# Patient Record
Sex: Male | Born: 1960 | Race: Black or African American | Hispanic: No | Marital: Single | Smoking: Never smoker
Health system: Southern US, Community
[De-identification: ages and names within clinical notes are randomized; demographics above are authoritative.]

## PROBLEM LIST (undated history)

## (undated) DIAGNOSIS — I10 Essential (primary) hypertension: Secondary | ICD-10-CM

## (undated) DIAGNOSIS — G4733 Obstructive sleep apnea (adult) (pediatric): Secondary | ICD-10-CM

## (undated) DIAGNOSIS — E119 Type 2 diabetes mellitus without complications: Secondary | ICD-10-CM

## (undated) DIAGNOSIS — I480 Paroxysmal atrial fibrillation: Secondary | ICD-10-CM

## (undated) DIAGNOSIS — M1990 Unspecified osteoarthritis, unspecified site: Secondary | ICD-10-CM

## (undated) DIAGNOSIS — H269 Unspecified cataract: Secondary | ICD-10-CM

## (undated) DIAGNOSIS — E669 Obesity, unspecified: Secondary | ICD-10-CM

## (undated) DIAGNOSIS — D649 Anemia, unspecified: Secondary | ICD-10-CM

## (undated) DIAGNOSIS — R001 Bradycardia, unspecified: Secondary | ICD-10-CM

## (undated) DIAGNOSIS — T7840XA Allergy, unspecified, initial encounter: Secondary | ICD-10-CM

## (undated) HISTORY — DX: Obesity, unspecified: E66.9

## (undated) HISTORY — DX: Essential (primary) hypertension: I10

## (undated) HISTORY — DX: Allergy, unspecified, initial encounter: T78.40XA

## (undated) HISTORY — DX: Paroxysmal atrial fibrillation: I48.0

## (undated) HISTORY — PX: BILATERAL KNEE ARTHROSCOPY: SUR91

## (undated) HISTORY — DX: Obstructive sleep apnea (adult) (pediatric): G47.33

## (undated) HISTORY — DX: Type 2 diabetes mellitus without complications: E11.9

## (undated) HISTORY — DX: Unspecified cataract: H26.9

## (undated) HISTORY — PX: EYE SURGERY: SHX253

## (undated) HISTORY — DX: Anemia, unspecified: D64.9

## (undated) HISTORY — DX: Bradycardia, unspecified: R00.1

## (undated) HISTORY — PX: JOINT REPLACEMENT: SHX530

## (undated) HISTORY — PX: OTHER SURGICAL HISTORY: SHX169

## (undated) HISTORY — PX: APPENDECTOMY: SHX54

---

## 2007-04-27 ENCOUNTER — Emergency Department (HOSPITAL_COMMUNITY): Admission: EM | Admit: 2007-04-27 | Discharge: 2007-04-27 | Payer: Self-pay | Admitting: Emergency Medicine

## 2009-02-26 ENCOUNTER — Inpatient Hospital Stay (HOSPITAL_COMMUNITY): Admission: EM | Admit: 2009-02-26 | Discharge: 2009-03-04 | Payer: Self-pay | Admitting: Emergency Medicine

## 2009-02-26 ENCOUNTER — Ambulatory Visit: Payer: Self-pay | Admitting: Internal Medicine

## 2009-02-28 ENCOUNTER — Encounter (INDEPENDENT_AMBULATORY_CARE_PROVIDER_SITE_OTHER): Payer: Self-pay | Admitting: *Deleted

## 2009-03-01 ENCOUNTER — Encounter: Payer: Self-pay | Admitting: Internal Medicine

## 2009-03-01 ENCOUNTER — Encounter (INDEPENDENT_AMBULATORY_CARE_PROVIDER_SITE_OTHER): Payer: Self-pay | Admitting: Internal Medicine

## 2009-03-05 ENCOUNTER — Telehealth (INDEPENDENT_AMBULATORY_CARE_PROVIDER_SITE_OTHER): Payer: Self-pay | Admitting: *Deleted

## 2009-03-06 ENCOUNTER — Encounter: Payer: Self-pay | Admitting: Internal Medicine

## 2009-03-06 ENCOUNTER — Ambulatory Visit: Payer: Self-pay

## 2009-03-07 ENCOUNTER — Ambulatory Visit: Payer: Self-pay

## 2010-02-04 DIAGNOSIS — I10 Essential (primary) hypertension: Secondary | ICD-10-CM | POA: Insufficient documentation

## 2010-02-04 DIAGNOSIS — D649 Anemia, unspecified: Secondary | ICD-10-CM | POA: Insufficient documentation

## 2010-02-04 DIAGNOSIS — I498 Other specified cardiac arrhythmias: Secondary | ICD-10-CM | POA: Insufficient documentation

## 2010-02-05 ENCOUNTER — Ambulatory Visit: Payer: Self-pay | Admitting: Pulmonary Disease

## 2010-02-05 DIAGNOSIS — G4733 Obstructive sleep apnea (adult) (pediatric): Secondary | ICD-10-CM | POA: Insufficient documentation

## 2010-03-10 ENCOUNTER — Encounter: Payer: Self-pay | Admitting: Pulmonary Disease

## 2010-03-10 ENCOUNTER — Ambulatory Visit (HOSPITAL_BASED_OUTPATIENT_CLINIC_OR_DEPARTMENT_OTHER): Admission: RE | Admit: 2010-03-10 | Discharge: 2010-03-10 | Payer: Self-pay | Admitting: Pulmonary Disease

## 2010-03-19 ENCOUNTER — Ambulatory Visit: Payer: Self-pay | Admitting: Pulmonary Disease

## 2010-03-25 ENCOUNTER — Ambulatory Visit: Payer: Self-pay | Admitting: Pulmonary Disease

## 2010-04-18 ENCOUNTER — Telehealth (INDEPENDENT_AMBULATORY_CARE_PROVIDER_SITE_OTHER): Payer: Self-pay | Admitting: *Deleted

## 2010-05-01 ENCOUNTER — Ambulatory Visit: Payer: Self-pay | Admitting: Pulmonary Disease

## 2010-05-06 ENCOUNTER — Telehealth (INDEPENDENT_AMBULATORY_CARE_PROVIDER_SITE_OTHER): Payer: Self-pay | Admitting: *Deleted

## 2010-05-25 ENCOUNTER — Encounter: Payer: Self-pay | Admitting: Pulmonary Disease

## 2010-10-13 HISTORY — PX: CATARACT EXTRACTION: SUR2

## 2010-11-13 NOTE — Assessment & Plan Note (Signed)
Summary: ov to discuss sleep study results/mg   Primary Provider/Referring Provider:  Joselyn Arrow  CC:  Pt is here for a f/u appt to discuss sleep study results.  .  History of Present Illness: the pt comes in today for f/u of his recent sleep study.  He was found to have very severe osa with AHI 93/hr and desat to 59%.  He had pac's, pvc's, and cardiac decels/accels.  I have reviewed his study with him in detail, and answered all of his questions.  Medications Prior to Update: 1)  Aspirin Low Dose 81 Mg Tabs (Aspirin) .... Take 1 Tablet By Mouth Once A Day  Allergies (verified): No Known Drug Allergies  Review of Systems  The patient denies shortness of breath with activity, shortness of breath at rest, productive cough, non-productive cough, coughing up blood, chest pain, irregular heartbeats, acid heartburn, indigestion, loss of appetite, weight change, abdominal pain, difficulty swallowing, sore throat, tooth/dental problems, headaches, nasal congestion/difficulty breathing through nose, sneezing, itching, ear ache, anxiety, depression, hand/feet swelling, joint stiffness or pain, rash, change in color of mucus, and fever.    Vital Signs:  Patient profile:   50 year old male Height:      75 inches Weight:      323 pounds BMI:     40.52 O2 Sat:      95 % on Room air Temp:     98.2 degrees F oral Pulse rate:   93 / minute BP sitting:   182 / 98  (left arm) Cuff size:   large  Vitals Entered By: Arman Filter LPN (March 25, 2010 3:17 PM)  O2 Flow:  Room air CC: Pt is here for a f/u appt to discuss sleep study results.   Comments Medications reviewed with patient Arman Filter LPN  March 25, 2010 3:17 PM    Physical Exam  General:  morbidly obese male in nad Extremities:  no edema or cyanosis Neurologic:  alert, definitely sleepy, moves all 4.   Impression & Recommendations:  Problem # 1:  OBSTRUCTIVE SLEEP APNEA (ICD-327.23) the pt has very severe osa with cardiac  arrhythmias noted.  I have stressed to him the importance of aggressive treatment due to its impact on CV health.  There is really only 2 acceptable options for treatment, weight loss and cpap.  Would like to start on moderate pressure level to allow desensitization, and optimize pressure for him at a later date.  He needs to work aggressively on weight loss.  Other Orders: Est. Patient Level III (16109) DME Referral (DME)  Patient Instructions: 1)  will start on cpap, and please call if having issues with tolerance 2)  work on weight loss 3)  followup with me in 4 weeks.

## 2010-11-13 NOTE — Progress Notes (Signed)
  Phone Note Other Incoming   Request: Send information Summary of Call: Request for records received from [facility]. Request forwarded to [facility].

## 2010-11-13 NOTE — Assessment & Plan Note (Signed)
Summary: Cardiology Nuclear Study  Nuclear Med Background Indications for Stress Test: Evaluation for Ischemia, Post Hospital  Indications Comments: 03/04/09 SOB-pneumonia,mild NSTEMI  History: Echo, Myocardial Infarction  History Comments: 03/01/09 Echo:EF=50-55%  Symptoms: Fatigue, SOB    Nuclear Pre-Procedure Cardiac Risk Factors: Hypertension, Obesity Caffeine/Decaff Intake: none NPO After: 10:30 PM Lungs: Clear IV 0.9% NS with Angio Cath: 22g     IV Site: (R) AC IV Started by: Irean Hong RN Chest Size (in) 58     Height (in): 75 Weight (lb): 312 BMI: 39.14 Tech Comments: The patient unable to walk treadmill due to knee problems. The patient was changed to dobutamine due to recent pneumonia.   Nuclear Med Study 1 or 2 day study:  2 day     Stress Test Type:  Dobutamine Reading MD:  Marca Ancona, MD     Referring MD:  Hillis Range Resting Radionuclide:  Technetium 29m Tetrofosmin     Resting Radionuclide Dose:  26.2 mCi  Stress Radionuclide:  Technetium 71m Tetrofosmin     Stress Radionuclide Dose:  27 mCi   Stress Protocol Exercise Time (min):  15:06 min     Max HR:  150 bpm Max Systolic BP: 172 mm Hg     % Max HR:  87 %Rate Pressure Product:  78295   Dose of Dobutamine:  40 mcg/kg/min (at max HR)  Stress Test Technologist:  Rea College CMA-N Rest Procedure  Myocardial perfusion imaging was performed at rest 45 minutes following the intraveneous administration of Myoview Technetium 30m Tetrofosmin.  Stress Procedure  The patient received IV dobutamine and 0.50 mg IV atropine. There were no significant ST changes with infusion, but he did have a hypotensive response and was given 250 cc normal saline after infusion.   He did c/o chest tightness with infusion.  Myoview was injected at peak heart rate and quantitative spect images were obtained after a 45 minute delay.  QPS Raw Data Images:  Normal; no motion artifact; normal heart/lung ratio. Stress Images:  There  is normal uptake in all areas. Rest Images:  Normal homogeneous uptake in all areas of the myocardium. Subtraction (SDS):  There is no evidence of scar or ischemia. Transient Ischemic Dilatation:  .83  (Normal <1.22)  Lung/Heart Ratio:  .34  (Normal <0.45)  Quantitative Gated Spect Images QGS EDV:  225 ml QGS ESV:  127 ml QGS EF:  44 % QGS cine images:  Mild global hypokinesis.    Overall Impression  Exercise Capacity: Dobutamine study with no exercise. BP Response: Normal blood pressure response. Clinical Symptoms: Chest tightness.  ECG Impression: No significant ST segment change suggestive of ischemia. Overall Impression: Normal perfusion images with no evidence for ischemia or infarction.  Mild global hypokinesis on gated images.   Appended Document: Cardiology Nuclear Study Paper copy filed in chart. Reviewed and signed per v.o. from Dr. Fayrene Fearing Allred/ Anselm Pancoast   [facility] w results that Dr Johney Frame hasnt rev'd   Appended Document: Cardiology Nuclear Study Please inform the patient that his study suggests normal perfusion.  No further cardiac workup is necessary at this time.  Appended Document: Cardiology Nuclear Study Dr Johney Frame rev'd [facility] for pt w results

## 2010-11-13 NOTE — Letter (Signed)
Summary: [facility]  [facility]   Imported By: Lester [place] 05/29/2010 10:14:36  _____________________________________________________________________  External Attachment:    Type:   Image     Comment:   External Document

## 2010-11-13 NOTE — Assessment & Plan Note (Signed)
Summary: rov for osa   Primary Provider/Referring Provider:  Joselyn Arrow  CC:  Pt is here for a 4 week f/u appt on his OSA. Pt states he has been wearing his cpap machine rarely because of having to wake up during the night to care for his sick mother and pt states he cannot hear her if he wears his cpap.  Marland Kitchen  History of Present Illness: The pt comes in today for f/u of his osa.  He was started on cpap last visit for severe osa, but unfortunately has not been compliant with treatment due to his concern for his mother during the middle of the night.  He cares for her at home, and is concerned that he will not be able to hear her during the night if she has issues.  The few times he did wear it, he did not have issues with the mask or pressure.  Medications Prior to Update: 1)  Aspirin Low Dose 81 Mg Tabs (Aspirin) .... Take 1 Tablet By Mouth Once A Day  Allergies (verified): No Known Drug Allergies  Review of Systems  The patient denies shortness of breath with activity, shortness of breath at rest, productive cough, non-productive cough, coughing up blood, chest pain, irregular heartbeats, acid heartburn, indigestion, loss of appetite, weight change, abdominal pain, difficulty swallowing, sore throat, tooth/dental problems, headaches, nasal congestion/difficulty breathing through nose, sneezing, itching, ear ache, anxiety, depression, hand/feet swelling, joint stiffness or pain, rash, change in color of mucus, and fever.    Vital Signs:  Patient profile:   50 year old male Height:      75 inches Weight:      325 pounds O2 Sat:      97 % on Room air Temp:     98.1 degrees F oral Pulse rate:   100 / minute BP sitting:   166 / 108  (left arm) Cuff size:   large  Vitals Entered By: Arman Filter LPN (May 01, 2010 3:01 PM)  O2 Flow:  Room air CC: Pt is here for a 4 week f/u appt on his OSA. Pt states he has been wearing his cpap machine rarely because of having to wake up during the night  to care for his sick mother and pt states he cannot hear her if he wears his cpap.   Comments Medications reviewed with patient  Arman Filter LPN  May 01, 2010 3:02 PM    Physical Exam  General:  ow male in nad Nose:  no skin breakdown or pressure necrosis from cpap mask. Extremities:  mild edema but no cyanosis Neurologic:  alert, appears sleepy, moves all 4.   Impression & Recommendations:  Problem # 1:  OBSTRUCTIVE SLEEP APNEA (ICD-327.23) the pt has very severe osa that is not currently being treated.  The pt is comfortable with the mask and pressure, but is concerned the noise from cpap may interfere with him caring for his elderly mother.  He wants to use the device if we can solve this concern.  I have asked him to get his mother a cowbell or loud buzzer, and I think he would easily hear that.  He is willing to give this a try.  Will optimize pressure for him once he has been on 10cm for 4 weeks to help with desensitization.  I have explained to him again that he has very severe osa with arrhythmias noted on his sleep study during his apneic events.  Other Orders: Est. Patient Level  III (16109) DME Referral (DME)  Patient Instructions: 1)  use the cpap nightly for the next 4 weeks...will have the equipment company change over to AUTO mode for 2 weeks at that time.  Will call you with your needed pressure setting, and arrange followup visit with me. 2)  work on weight loss

## 2010-11-13 NOTE — Progress Notes (Signed)
Summary: Dr. Joselyn Arrow No Longer Primary Care  Spoke to patient about his Primary Care Physician it was going to be Dr. Joselyn Arrow who is no longer practicing at [facility].  He is going to try and find another PCP here in town.Maurice Golden  May 06, 2010 1:44 PM

## 2010-11-13 NOTE — Consult Note (Signed)
Summary: [facility]  [facility]   Imported By: Sherian Rein 04/25/2009 12:55:21  _____________________________________________________________________  External Attachment:    Type:   Image     Comment:   External Document

## 2010-11-13 NOTE — Progress Notes (Signed)
Summary: Nuc pre procedure  Phone Note Outgoing Call Call back at [facility] Phone [phone]   Call placed by: Rea College, CMA,  Mar 05, 2009 3:42 PM Call placed to: Patient Summary of Call: Unable to call patient with information for stress myoview, only phone number in chart is in [place].  Patient was discharged from the hospital 03/04/09.       Nuclear Med Background Indications for Stress Test: Evaluation for Ischemia, Post Hospital  Indications Comments: 03/04/09 SOB-pneumonia,mild NSTEMI  History: Echo  History Comments: 03/01/09 Echo:EF=50-55%  Symptoms: Fatigue, SOB    Nuclear Pre-Procedure Cardiac Risk Factors: Hypertension, Obesity

## 2010-11-13 NOTE — Assessment & Plan Note (Signed)
Summary: self referral for hypersomnia, possible sleep apnea   Primary Provider/Referring Provider:  Joselyn Arrow  CC:  self referral for sleep apnea.  History of Present Illness: the pt is a 49y/o male who comes in today as a self referral for possible osa.  He has been noted to have loud snoring, as well as pauses in his breathing during sleep.  He goes to bed btw 11pm and 1am, and arises at 8:30 am to start his day.  He has very frequent awakenings at night, and is not rested upon arising in the am's.  He notes definite sleep pressure during the day with watching tv, and also notes a significantly decreased energy level.  He will fall asleep easily with movies in the evening.  He denies sleepiness with driving.  He weight is up about 20 pounds over the past 2 years, and his epworth score today is 10.    Preventive Screening-Counseling & Management  Alcohol-Tobacco     Alcohol drinks/day: <1     Smoking Status: never  Current Medications (verified): 1)  Aspirin Low Dose 81 Mg Tabs (Aspirin) .... Take 1 Tablet By Mouth Once A Day  Allergies (verified): No Known Drug Allergies  Past History:  Past Medical History:  OBESITY, MORBID (ICD-278.01) ANEMIA (ICD-285.9) HYPERTENSION (ICD-401.9) BRADYCARDIA (ICD-427.89)    Past Surgical History: Reviewed history from 02/04/2010 and no changes required. B knee arthroscopy appendectomy L eye cataract  Family History: Reviewed history from 02/04/2010 and no changes required. Mother: HTN, Dialysis, DM Father: HTN   Social History: Reviewed history and no changes required. 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  The patient denies shortness of breath with activity, shortness of breath at rest, productive cough, coughing up blood, chest  pain, irregular heartbeats, acid heartburn, indigestion, loss of appetite, weight change, abdominal pain, difficulty swallowing, sore throat, tooth/dental problems, headaches, nasal congestion/difficulty breathing through nose, sneezing, itching, ear ache, anxiety, depression, hand/feet swelling, rash, change in color of mucus, and fever.    Vital Signs:  Patient profile:   50 year old male Height:      75 inches (190.50 cm) Weight:      334 pounds (151.82 kg) BMI:     41.90 O2 Sat:      93 % on Room air Temp:     98.0 degrees F (36.67 degrees C) oral Pulse rate:   104 / minute BP sitting:   190 / 100  (left arm) Cuff size:   large  Vitals Entered By: Michel Bickers CMA (February 05, 2010 10:55 AM)  O2 Sat at Rest %:  93 O2 Flow:  Room air CC: self referral for sleep apnea   Physical Exam  General:  obese male in nad Eyes:  PERRLA and EOMI.   Nose:  large turbs, but patent. Mouth:  elongation of soft palate, normal uvula, enlarged tonsils with narrowed posterior space. Neck:  large neck, no jvd, tmg, LN Lungs:  clear to auscultation Heart:  rrr, no mrg Abdomen:  soft and nontender, bs+ Extremities:  no edema, pulses intact. no cyanosis Neurologic:  alert and oriented, moves all 4.   Impression & Recommendations:  Problem # 1:  OBSTRUCTIVE SLEEP APNEA (ICD-327.23) the pt's history is very suggestive of osa.  He is overweight, has  abnl upper airway anatomy, and has been noted in the past to have bradyarrhythmias during sleep.  I have had a long discussion with the pt about sleep apnea, including its impact on his QOL and CV health.  He needs to have a sleep study done, and the pt agrees.  I have also encouraged him to work on weight loss.  Will arrange followup with me once sleep study results are available.  Other Orders: New Patient Level IV (16109) Sleep Disorder Referral (Sleep Disorder)  Patient Instructions: 1)  will schedule for sleep study, and will arrange f/u once  results are available. 2)  work on weight loss.

## 2011-01-21 LAB — CULTURE, BLOOD (ROUTINE X 2)
Culture: NO GROWTH
Culture: NO GROWTH

## 2011-01-21 LAB — CBC
HCT: 34.4 % — ABNORMAL LOW (ref 39.0–52.0)
HCT: 35 % — ABNORMAL LOW (ref 39.0–52.0)
HCT: 37.3 % — ABNORMAL LOW (ref 39.0–52.0)
Hemoglobin: 11.7 g/dL — ABNORMAL LOW (ref 13.0–17.0)
Hemoglobin: 11.8 g/dL — ABNORMAL LOW (ref 13.0–17.0)
Hemoglobin: 12.6 g/dL — ABNORMAL LOW (ref 13.0–17.0)
MCHC: 33.7 g/dL (ref 30.0–36.0)
MCHC: 33.8 g/dL (ref 30.0–36.0)
MCHC: 34.1 g/dL (ref 30.0–36.0)
MCV: 84.2 fL (ref 78.0–100.0)
MCV: 85.1 fL (ref 78.0–100.0)
MCV: 85.6 fL (ref 78.0–100.0)
Platelets: 288 10*3/uL (ref 150–400)
Platelets: 294 10*3/uL (ref 150–400)
Platelets: 318 10*3/uL (ref 150–400)
RBC: 4.08 MIL/uL — ABNORMAL LOW (ref 4.22–5.81)
RBC: 4.11 MIL/uL — ABNORMAL LOW (ref 4.22–5.81)
RBC: 4.36 MIL/uL (ref 4.22–5.81)
RDW: 14 % (ref 11.5–15.5)
RDW: 14.3 % (ref 11.5–15.5)
RDW: 14.5 % (ref 11.5–15.5)
WBC: 7.9 10*3/uL (ref 4.0–10.5)
WBC: 8.8 10*3/uL (ref 4.0–10.5)
WBC: 9.5 10*3/uL (ref 4.0–10.5)

## 2011-01-21 LAB — URINALYSIS, ROUTINE W REFLEX MICROSCOPIC
Glucose, UA: NEGATIVE mg/dL
Hgb urine dipstick: NEGATIVE
Ketones, ur: 15 mg/dL — AB
Nitrite: NEGATIVE
Protein, ur: 100 mg/dL — AB
Specific Gravity, Urine: 1.035 — ABNORMAL HIGH (ref 1.005–1.030)
Urobilinogen, UA: 1 mg/dL (ref 0.0–1.0)
pH: 5.5 (ref 5.0–8.0)

## 2011-01-21 LAB — DIFFERENTIAL
Basophils Absolute: 0 10*3/uL (ref 0.0–0.1)
Basophils Absolute: 0 10*3/uL (ref 0.0–0.1)
Basophils Relative: 0 % (ref 0–1)
Basophils Relative: 1 % (ref 0–1)
Eosinophils Absolute: 0.1 10*3/uL (ref 0.0–0.7)
Eosinophils Absolute: 0.4 10*3/uL (ref 0.0–0.7)
Eosinophils Relative: 1 % (ref 0–5)
Eosinophils Relative: 4 % (ref 0–5)
Lymphocytes Relative: 14 % (ref 12–46)
Lymphocytes Relative: 23 % (ref 12–46)
Lymphs Abs: 1.3 10*3/uL (ref 0.7–4.0)
Lymphs Abs: 2.2 10*3/uL (ref 0.7–4.0)
Monocytes Absolute: 0.8 10*3/uL (ref 0.1–1.0)
Monocytes Absolute: 1 10*3/uL (ref 0.1–1.0)
Monocytes Relative: 11 % (ref 3–12)
Monocytes Relative: 9 % (ref 3–12)
Neutro Abs: 6.1 10*3/uL (ref 1.7–7.7)
Neutro Abs: 6.4 10*3/uL (ref 1.7–7.7)
Neutrophils Relative %: 65 % (ref 43–77)
Neutrophils Relative %: 72 % (ref 43–77)

## 2011-01-21 LAB — COMPREHENSIVE METABOLIC PANEL
ALT: 13 U/L (ref 0–53)
ALT: 14 U/L (ref 0–53)
AST: 19 U/L (ref 0–37)
AST: 21 U/L (ref 0–37)
Albumin: 2.6 g/dL — ABNORMAL LOW (ref 3.5–5.2)
Albumin: 3.2 g/dL — ABNORMAL LOW (ref 3.5–5.2)
Alkaline Phosphatase: 54 U/L (ref 39–117)
Alkaline Phosphatase: 54 U/L (ref 39–117)
BUN: 18 mg/dL (ref 6–23)
BUN: 9 mg/dL (ref 6–23)
CO2: 27 mEq/L (ref 19–32)
CO2: 30 mEq/L (ref 19–32)
Calcium: 8.6 mg/dL (ref 8.4–10.5)
Calcium: 8.7 mg/dL (ref 8.4–10.5)
Chloride: 106 mEq/L (ref 96–112)
Chloride: 99 mEq/L (ref 96–112)
Creatinine, Ser: 0.92 mg/dL (ref 0.4–1.5)
Creatinine, Ser: 1.27 mg/dL (ref 0.4–1.5)
GFR calc Af Amer: >60 mL/min (ref >60)
GFR calc Af Amer: >60 mL/min (ref >60)
GFR calc non Af Amer: >60 mL/min (ref >60)
GFR calc non Af Amer: >60 mL/min (ref >60)
Glucose, Bld: 130 mg/dL — ABNORMAL HIGH (ref 70–99)
Glucose, Bld: 95 mg/dL (ref 70–99)
Potassium: 3.6 mEq/L (ref 3.5–5.1)
Potassium: 3.7 mEq/L (ref 3.5–5.1)
Sodium: 137 mEq/L (ref 135–145)
Sodium: 140 mEq/L (ref 135–145)
Total Bilirubin: 0.4 mg/dL (ref 0.3–1.2)
Total Bilirubin: 1.5 mg/dL — ABNORMAL HIGH (ref 0.3–1.2)
Total Protein: 7 g/dL (ref 6.0–8.3)
Total Protein: 7.4 g/dL (ref 6.0–8.3)

## 2011-01-21 LAB — URINE MICROSCOPIC-ADD ON

## 2011-01-21 LAB — CARDIAC PANEL(CRET KIN+CKTOT+MB+TROPI)
CK, MB: 1.4 ng/mL (ref 0.3–4.0)
CK, MB: 1.8 ng/mL (ref 0.3–4.0)
CK, MB: 1.8 ng/mL (ref 0.3–4.0)
Relative Index: 1.3 (ref 0.0–2.5)
Relative Index: 1.3 (ref 0.0–2.5)
Relative Index: INVALID (ref 0.0–2.5)
Total CK: 135 U/L (ref 7–232)
Total CK: 136 U/L (ref 7–232)
Total CK: 97 U/L (ref 7–232)
Troponin I: 0.09 ng/mL — ABNORMAL HIGH (ref 0.00–0.06)
Troponin I: 0.15 ng/mL — ABNORMAL HIGH (ref 0.00–0.06)
Troponin I: 0.21 ng/mL — ABNORMAL HIGH (ref 0.00–0.06)

## 2011-01-21 LAB — RAPID URINE DRUG SCREEN, HOSP PERFORMED
Amphetamines: NOT DETECTED
Barbiturates: NOT DETECTED
Benzodiazepines: NOT DETECTED
Cocaine: NOT DETECTED
Opiates: NOT DETECTED
Tetrahydrocannabinol: NOT DETECTED

## 2011-01-21 LAB — BRAIN NATRIURETIC PEPTIDE: Pro B Natriuretic peptide (BNP): 432 pg/mL — ABNORMAL HIGH (ref 0.0–100.0)

## 2011-01-21 LAB — T4, FREE: Free T4: 0.85 ng/dL (ref 0.80–1.80)

## 2011-01-21 LAB — URINE CULTURE
Colony Count: NO GROWTH
Culture: NO GROWTH

## 2011-01-21 LAB — TSH: TSH: 0.959 u[IU]/mL (ref 0.350–4.500)

## 2011-01-21 LAB — MAGNESIUM: Magnesium: 2.2 mg/dL (ref 1.5–2.5)

## 2011-01-21 LAB — LACTIC ACID, PLASMA: Lactic Acid, Venous: 1.7 mmol/L (ref 0.5–2.2)

## 2011-02-25 NOTE — Consult Note (Signed)
NAME:  Maurice Golden, SHABAZZ NO.:  [id number]   MEDICAL RECORD NO.:  [id number]          PATIENT TYPE:  INP   LOCATION:  4702                         FACILITY:  [facility]   PHYSICIAN:  Hillis Range, MD       DATE OF BIRTH:  [birth year]   DATE OF CONSULTATION:  DATE OF DISCHARGE:                                 CONSULTATION   REQUESTING:  Hospitalist Service.   REASON FOR CONSULTATION:  Bradycardia.   HISTORY OF PRESENT ILLNESS:  [name] is a pleasant 50 year old  gentleman without significant past medical history.  He was now admitted  with a chief complaint of worsening shortness of breath and a  nonproductive cough.  The patient reports that he was in his usual state  of health until approximately 1 week ago when he began developing  symptoms of malaise, subjective fevers, cough, and shortness of breath  with associated decreased exercise tolerance.  He denies chest pain,  orthopnea, PND, palpitations, presyncope, or syncope.  Upon being  hospitalized, the patient has been observed on telemetry to have several  episodes of sinus bradycardia with a junctional escape rhythm.  These  episodes have occurred on telemetry last evening and also this morning.  During both episodes, the patient was confirmed to be asleep and snoring  loudly by the staff.  He was asymptomatic with both episodes.  He  quickly returned to sinus tachycardia upon awaking.  He has had no  pauses over 3 seconds in duration.  He denies any prior symptomatic  bradycardias.  He is unaware of any significant dizziness,  lightheadedness, presyncope, or syncope.  The patient and his brother  both note that he snores loudly and has prolonged apneic spells with  sleeping in the past.  The patient does not feel well rested and  frequently takes naps throughout the day.  Otherwise, his exercise  tolerance is very well preserved.  He lifts weights frequently and also  rides a stationary bike.  He has previously  been very athletic, but  reports that chronic knee injuries have caused a significant decline in  his exercise efforts.  He denies any symptoms of ischemia, bradycardia,  or congestive heart failure previously.  He is otherwise without  complaint.  The patient has been initiated on intravenous antibiotics  and gently diuresed with some improvement in his symptoms.   PAST MEDICAL HISTORY:  Degenerative joint disease, status post bilateral  knee arthroscopy.   PAST SURGICAL HISTORY:  1. Appendectomy.  2. Cataract surgery.  3. Bilateral knee arthroscopy.   MEDICATIONS:  Aspirin 81 mg daily.   ALLERGIES:  No known drug allergies.   SOCIAL HISTORY:  The patient lives in [place] and recently relocated  from [place] approximately 1 year ago.  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  He now lives near his mother and  brother.  He denies tobacco or drug use and occasionally drinks alcohol  socially.   FAMILY HISTORY:  Notable for a mother and brother both have sarcoidosis.   REVIEW OF SYSTEMS:  All systems are reviewed and negative except as  outlined above.   PHYSICAL EXAMINATION:  GENERAL:  Telemetry reveals predominantly sinus  rhythm with heart rates in the 80s to 110s.  He has had several episodes  of sinus bradycardia which appeared to be short-lived and have occurred  during sleep.  The longest pause of 2.9 seconds.  VITAL SIGNS:  Blood pressure 142/101, heart rate 91, respirations 20,  sats 91% on 2 L, afebrile.  GENERAL:  The patient is a well-appearing African American male in no  acute distress.  He is alert and oriented x3.  HEENT:  Normocephalic, atraumatic.  Sclerae clear.  Conjunctivae pink.  Oropharynx clear.  NECK:  Supple.  No thyromegaly, JVD, or bruits.  LUNGS:  Bibasilar rales, otherwise clear.  No wheezing or rhonchi.  Normal work of breathing.  HEART:  Regular rate and rhythm.  No murmurs, rubs, or gallops.  GI:  Soft, nontender,  nondistended.  Positive bowel signs.  EXTREMITIES:  No clubbing, cyanosis, or edema.  NEUROLOGIC:  Cranial nerves II through XII are intact.  Strength and  sensation are intact.  SKIN:  No ecchymoses or lacerations.  MUSCULOSKELETAL:  No deformity or atrophy, cyclothymic mood, full  affect.   LABORATORY DATA:  I have reviewed the patient's chest x-ray from Feb 25, 2009, which demonstrates diffuse bilateral infiltrates.  I have also  reviewed the chest x-ray from today which continues to reveal diffuse  bilateral infiltrates.  There are no effusions.   EKG, the patient's EKG reveals sinus rhythm with a PR duration 196 msec  and a QT interval of 396 msec with nonspecific ST/T-wave changes.   CK 135, CK-MB 1.8, troponin 0.21.  Blood cultures are negative thus far,  platelet count is 288, white blood cell count 7.9, hematocrit 35.  BNP  432.  Drug screen, negative.   IMPRESSION:  [name] is a pleasant 50 year old gentleman who was  admitted with shortness of breath and decreased exercise tolerance.  His  chest x-rays are certainly suggestive of pulmonary edema as a possible  cause and his BNP is elevated.  A transthoracic echocardiogram has been  obtained, but results are currently pending.  He has had mild elevation  of his troponins, but without frank symptoms of ischemia.  The patient  has symptoms suggestive of obstructive sleep apnea and during episodes  of sleep has been observed to have sinus bradycardia.  I think that his  sinus bradycardia is benign and likely related to his sleep pattern.  As  he has had no symptomatic bradycardias, I do not think that pacemaker or  further aggressive cardiac interventions are necessary.  I would,  however, like to review the patient's transthoracic echocardiogram to  evaluate for any evidence of reduced ejection fraction.  If his ejection  fraction is reduced, then I would favor a heart catheterization,  however, if his ejection fraction  is preserved, then I think we should  continue to risk stratify the patient with a GXT Myoview once his  shortness of breath has been resolved.  This could be obtained next  week.  In the interim, I will continue to gently diurese the patient.  I  would also recommend a sleep study at discharge to evaluate for  symptomatic sleep apnea.      Hillis Range, MD  Electronically Signed     JA/MEDQ  D:  03/01/2009  T:  03/02/2009  Job:  [id number]

## 2011-02-25 NOTE — Discharge Summary (Signed)
NAME:  Maurice Golden, Maurice Golden NO.:  [id number]   MEDICAL RECORD NO.:  [id number]          PATIENT TYPE:  INP   LOCATION:  2025                         FACILITY:  [facility]   PHYSICIAN:  Ruthy Dick, MD    DATE OF BIRTH:  08-20-1961   DATE OF ADMISSION:  02/25/2009  DATE OF DISCHARGE:  03/04/2009                               DISCHARGE SUMMARY   REASON FOR ADMISSION:  Shortness of breath, hypoxia and pneumonia.   FINAL DISCHARGE DIAGNOSES:  1. Pneumonia.  2. Bradycardia.  3. Mild non-ST elevation myocardial infarction.  4. Hypoxia, resolved.  5. Hypertension.  6. Anemia.  7. Morbid obesity.  8. Urinary tract infection.  9. Presumed obstructive sleep apnea.   PROCEDURES DONE DURING THIS ADMISSION:  Two-D echocardiogram which  showed a 50-55% ejection fraction.   CONSULT DURING THIS ADMISSION:  Electrophysiology/Cardiology consult  with Dr. Hillis Range.   BRIEF HISTORY OF PRESENT ILLNESS:  This is a 50 year old African  American male with no significant past medical history who came in with  worsening shortness of breath and was noted to have bilateral  infiltrates on this chest x-ray.  Admitted and treated with medications  for pneumonia.  He did well on these but at the same time his shortness  of breath warranted a further workup which included a 2-D echo.  This  showed a 55% ejection fraction.  The patient also had episodes of  bradycardia which was mostly induced during fluid and he had pulses on  his telemetry sleep.  Because of this, Electrophysiology consult was  placed and the patient was seen by Dr. Johney Frame.  His opinion is that the  patient's bradycardia and pauses induced during sleep and probably  caused by sleep apnea.  He recommended that further workup should  include Myoview in the outpatient setting.  I had the report until  speaking to Dr. Johney Frame today and according to him the patient should be  able to have a Myoview at the [facility]  in 1 day also.  He  actually mentioned that there is a spot for tomorrow.  Whether the  patient will be able to go for this appointment is not yet known but we  will provide the patient with phone number to [facility] and Dr.  Johney Frame has also promised to help scheduling for the outpatient stress  test.  We will also have the patient undergo outpatient polysomnogram to  evaluate for sleep apnea.  Otherwise, the patient is doing very well  today, no symptoms today, no shortness of breath, no abdominal pain, no  nausea, and no vomiting.  He says his energy is coming back and he is  able to walk longer distances without being tired as much as before.  We  believe his shortness of breath is multifactorial and this would include  obstructive sleep apnea, the pneumonitis, probably pulmonary  hypertension caused by the sleep apnea.  His morbid obesity is also a  factor here.   PHYSICAL EXAMINATION:  VITAL SIGNS:  Temperature 97.7, pulse 70,  respiration 14, blood pressure 141/93, saturating  98% on trial CPAP.  CHEST:  Clear to auscultation bilaterally.  ABDOMEN:  Soft, nontender.  EXTREMITIES:  No clubbing, no signs, no edema  CARDIOVASCULAR:  First and second heart sounds only.  No murmurs.  CENTRAL NERVOUS SYSTEM:  Nonfocal.  SKIN:  No rash.   The patient is to follow with Dr. Mikeal Hawthorne in the outpatient setting on Mar 07, 2009 at 3 p.m.  The patient has been given a phone number to call  for direction.  He is to be scheduled for a polysomnogram as an  outpatient and a stress test has also been recommended and as noted  above, Dr. Johney Frame and his physician assistant will help with scheduling  this.   Tenure for discharge planning is greater than 30 minutes.   DISCHARGE MEDICATIONS:  1. Avelox 400 mg p.o. daily for 7 days.  2. Lisinopril 20 mg p.o. b.i.d.  3. Mucinex 600 mg p.o. b.i.d. for 7 days.  4. Hydrochlorothiazide 12.5 mg p.o. daily.  5. The patient is to continue aspirin 81  mg p.o. daily.      Ruthy Dick, MD  Electronically Signed     GU/MEDQ  D:  03/04/2009  T:  03/05/2009  Job:  [id number]   cc:   Hillis Range, MD  Lonia Blood, M.D.

## 2011-02-25 NOTE — H&P (Signed)
NAME:  Maurice Golden, Maurice Golden NO.:  [id number]   MEDICAL RECORD NO.:  [id number]          PATIENT TYPE:  INP   LOCATION:  1825                         FACILITY:  [facility]   PHYSICIAN:  Della Goo, M.D. DATE OF BIRTH:  1961/07/09   DATE OF ADMISSION:  02/26/2009  DATE OF DISCHARGE:                              HISTORY & PHYSICAL   PRIMARY CARE PHYSICIAN:  Unassigned.   CHIEF COMPLAINT:  Shortness of breath   HISTORY OF PRESENT ILLNESS:  This is a 50 year old male who presents to  the emergency department with complaints of worsening shortness of  breath and nonproductive cough over the past 3 days.  He denies having  fever, however, has started to have fevers while in the emergency  department to 100.5.  He denies having any nausea, vomiting, myalgias,  diarrhea.  The patient reports having dyspnea on exertion.  Denies  having any chest pain.   The patient was evaluated in the emergency department and a chest x-ray  was performed which revealed bilateral infiltrates consistent with  pneumonia.  The patient was placed on IV antibiotic therapy, Rocephin  and azithromycin and referred for admission.   PAST MEDICAL HISTORY:  None.   PAST SURGICAL HISTORY:  1. History of arthroscopic surgeries of both knees.  2. Appendectomy.  3. Cataract surgery of the left eye.   MEDICATIONS:  Aspirin 81 mg one p.o. daily.   ALLERGIES:  NO KNOWN DRUG ALLERGIES.   SOCIAL HISTORY:  The patient is a nonsmoker, nondrinker and he has no  history of illicit drug usage.   FAMILY HISTORY:  Positive for hypertension in both parents and all of  his siblings.  His mother is on dialysis treatment and his mother also  had diabetes.  However, he states that this resolved.   REVIEW OF SYSTEMS:  Pertinents are mentioned above.  All organ systems  are negative otherwise.   PHYSICAL EXAMINATION FINDINGS:  This is a 50 year old, tall, obese male  in discomfort but no acute distress.  VITAL  SIGNS:  Temperature 100.5, blood pressure 125/79, heart rate 84-  135, respirations 22-24, O2 saturation 92-96%.  HEENT:  Examination normocephalic, atraumatic.  Pupils equally round  reactive to light.  Extraocular movements are intact.  Funduscopic  benign.  There is no scleral icterus.  Nares are patent bilaterally.  Oropharynx is clear.  NECK:  Supple full range of motion.  No thyromegaly, adenopathy, jugular  venous distention.  CARDIOVASCULAR:  Regular rate and rhythm.  No murmurs, gallops or rubs.  LUNGS:  Clear to auscultation bilaterally.  ABDOMEN:  Positive bowel sounds, soft, nontender, nondistended.  EXTREMITIES:  Without cyanosis, clubbing or edema.  SKIN:  The patient has multiple tattoos of the upper extremities.  NEUROLOGIC:  The patient is alert and oriented x3.  There are no focal  deficits on examination.   LABORATORY STUDIES:  White blood cell count 9.5, hemoglobin 12.6,  hematocrit 37.3, platelets 294, neutrophils 65% lymphocytes 23%.  Sodium  140, potassium 3.6, chloride 106, carbon dioxide 27, BUN 18, creatinine  1.27 and glucose 95, albumin 3.2, lactic  acid level 1.7.  Urinalysis  moderate leukocyte esterase.  Urine drug screen negative.  Chest x-ray  reveals cardiomegaly and bilateral patchy infiltrates, right greater  than left.   ASSESSMENT:  A 50 year old male being admitted with:  1. Atypical pneumonia/bilateral pneumonia.  2. Shortness of breath secondary to #1.  3. Normocytic anemia.  4. Normocytic pyuria.   PLAN:  The patient will be admitted and placed on IV antibiotic therapy  of Rocephin and azithromycin.  Nebulizer treatments with albuterol and  Atrovent therapy have been ordered.  The patient will be placed on  Mucinex therapy as needed for congestion, Robitussin has also been  ordered for coughing.  The patient will be placed on IV fluids.  Beta  natriuretic peptide will be sent.  The patient will be placed on DVT and  GI prophylaxis and  further workup will ensue pending results of the  patient's clinical course and his studies.      Della Goo, M.D.  Electronically Signed     HJ/MEDQ  D:  02/26/2009  T:  02/26/2009  Job:  [id number]

## 2011-08-05 ENCOUNTER — Emergency Department (HOSPITAL_COMMUNITY): Payer: BC Managed Care – PPO

## 2011-08-05 ENCOUNTER — Inpatient Hospital Stay (HOSPITAL_COMMUNITY)
Admission: EM | Admit: 2011-08-05 | Discharge: 2011-08-09 | DRG: 139 | Disposition: A | Discharge status: Home / Self Care | Payer: BC Managed Care – PPO | Attending: Cardiovascular Disease | Admitting: Cardiovascular Disease

## 2011-08-05 DIAGNOSIS — G4733 Obstructive sleep apnea (adult) (pediatric): Secondary | ICD-10-CM | POA: Diagnosis present

## 2011-08-05 DIAGNOSIS — E119 Type 2 diabetes mellitus without complications: Secondary | ICD-10-CM | POA: Diagnosis present

## 2011-08-05 DIAGNOSIS — Z79899 Other long term (current) drug therapy: Secondary | ICD-10-CM

## 2011-08-05 DIAGNOSIS — R42 Dizziness and giddiness: Secondary | ICD-10-CM

## 2011-08-05 DIAGNOSIS — I495 Sick sinus syndrome: Secondary | ICD-10-CM | POA: Diagnosis not present

## 2011-08-05 DIAGNOSIS — I10 Essential (primary) hypertension: Secondary | ICD-10-CM | POA: Diagnosis present

## 2011-08-05 DIAGNOSIS — Z7982 Long term (current) use of aspirin: Secondary | ICD-10-CM

## 2011-08-05 DIAGNOSIS — I4891 Unspecified atrial fibrillation: Principal | ICD-10-CM | POA: Diagnosis present

## 2011-08-05 DIAGNOSIS — E669 Obesity, unspecified: Secondary | ICD-10-CM | POA: Diagnosis present

## 2011-08-05 LAB — DIFFERENTIAL
Basophils Absolute: 0.1 10*3/uL (ref 0.0–0.1)
Basophils Relative: 1 % (ref 0–1)
Eosinophils Absolute: 0.1 10*3/uL (ref 0.0–0.7)
Eosinophils Relative: 1 % (ref 0–5)
Lymphocytes Relative: 40 % (ref 12–46)
Lymphs Abs: 3.5 10*3/uL (ref 0.7–4.0)
Monocytes Absolute: 0.7 10*3/uL (ref 0.1–1.0)
Monocytes Relative: 8 % (ref 3–12)
Neutro Abs: 4.3 10*3/uL (ref 1.7–7.7)
Neutrophils Relative %: 50 % (ref 43–77)

## 2011-08-05 LAB — CK TOTAL AND CKMB (NOT AT ARMC)
CK, MB: 2.1 ng/mL (ref 0.3–4.0)
Relative Index: INVALID (ref 0.0–2.5)
Total CK: 62 U/L (ref 7–232)

## 2011-08-05 LAB — BASIC METABOLIC PANEL
BUN: 20 mg/dL (ref 6–23)
CO2: 27 mEq/L (ref 19–32)
Calcium: 9.3 mg/dL (ref 8.4–10.5)
Chloride: 101 mEq/L (ref 96–112)
Creatinine, Ser: 1.1 mg/dL (ref 0.50–1.35)
GFR calc Af Amer: 89 mL/min — ABNORMAL LOW (ref >90)
GFR calc non Af Amer: 77 mL/min — ABNORMAL LOW (ref >90)
Glucose, Bld: 132 mg/dL — ABNORMAL HIGH (ref 70–99)
Potassium: 3.5 mEq/L (ref 3.5–5.1)
Sodium: 138 mEq/L (ref 135–145)

## 2011-08-05 LAB — PROTIME-INR
INR: 1.05 (ref 0.00–1.49)
Prothrombin Time: 13.9 seconds (ref 11.6–15.2)

## 2011-08-05 LAB — CBC
HCT: 40.2 % (ref 39.0–52.0)
Hemoglobin: 13.6 g/dL (ref 13.0–17.0)
MCH: 28 pg (ref 26.0–34.0)
MCHC: 33.8 g/dL (ref 30.0–36.0)
MCV: 82.9 fL (ref 78.0–100.0)
Platelets: 292 10*3/uL (ref 150–400)
RBC: 4.85 MIL/uL (ref 4.22–5.81)
RDW: 13.9 % (ref 11.5–15.5)
WBC: 8.7 10*3/uL (ref 4.0–10.5)

## 2011-08-05 LAB — TSH: TSH: 1.211 u[IU]/mL (ref 0.350–4.500)

## 2011-08-05 LAB — TROPONIN I: Troponin I: <0.3 ng/mL (ref <0.30)

## 2011-08-05 LAB — POCT I-STAT TROPONIN I: Troponin i, poc: 0.01 ng/mL (ref 0.00–0.08)

## 2011-08-05 LAB — T4, FREE: Free T4: 1.34 ng/dL (ref 0.80–1.80)

## 2011-08-06 DIAGNOSIS — I4891 Unspecified atrial fibrillation: Secondary | ICD-10-CM

## 2011-08-06 DIAGNOSIS — I517 Cardiomegaly: Secondary | ICD-10-CM

## 2011-08-06 LAB — CARDIAC PANEL(CRET KIN+CKTOT+MB+TROPI)
CK, MB: 2 ng/mL (ref 0.3–4.0)
CK, MB: 2 ng/mL (ref 0.3–4.0)
Relative Index: INVALID (ref 0.0–2.5)
Relative Index: INVALID (ref 0.0–2.5)
Total CK: 54 U/L (ref 7–232)
Total CK: 46 U/L (ref 7–232)
Troponin I: <0.3 ng/mL (ref <0.30)
Troponin I: <0.3 ng/mL (ref <0.30)

## 2011-08-06 LAB — MRSA PCR SCREENING: MRSA by PCR: NEGATIVE

## 2011-08-06 LAB — LIPID PANEL
Cholesterol: 179 mg/dL (ref 0–200)
HDL: 35 mg/dL — ABNORMAL LOW (ref >39)
LDL Cholesterol: 119 mg/dL — ABNORMAL HIGH (ref 0–99)
Total CHOL/HDL Ratio: 5.1 RATIO
Triglycerides: 124 mg/dL (ref <150)
VLDL: 25 mg/dL (ref 0–40)

## 2011-08-06 LAB — HEMOGLOBIN A1C
Hgb A1c MFr Bld: 6.8 % — ABNORMAL HIGH (ref <5.7)
Mean Plasma Glucose: 148 mg/dL — ABNORMAL HIGH (ref <117)

## 2011-08-06 LAB — HEPARIN LEVEL (UNFRACTIONATED): Heparin Unfractionated: <0.1 IU/mL — ABNORMAL LOW (ref 0.30–0.70)

## 2011-08-07 ENCOUNTER — Inpatient Hospital Stay (HOSPITAL_COMMUNITY): Payer: BC Managed Care – PPO

## 2011-08-07 DIAGNOSIS — I495 Sick sinus syndrome: Secondary | ICD-10-CM

## 2011-08-07 LAB — GLUCOSE, CAPILLARY: Glucose-Capillary: 141 mg/dL — ABNORMAL HIGH (ref 70–99)

## 2011-08-07 LAB — BASIC METABOLIC PANEL
BUN: 19 mg/dL (ref 6–23)
CO2: 29 mEq/L (ref 19–32)
Calcium: 9.2 mg/dL (ref 8.4–10.5)
Chloride: 102 mEq/L (ref 96–112)
Creatinine, Ser: 1.08 mg/dL (ref 0.50–1.35)
GFR calc Af Amer: >90 mL/min (ref >90)
GFR calc non Af Amer: 78 mL/min — ABNORMAL LOW (ref >90)
Glucose, Bld: 110 mg/dL — ABNORMAL HIGH (ref 70–99)
Potassium: 4.4 mEq/L (ref 3.5–5.1)
Sodium: 138 mEq/L (ref 135–145)

## 2011-08-07 LAB — CBC
HCT: 36.8 % — ABNORMAL LOW (ref 39.0–52.0)
Hemoglobin: 12.2 g/dL — ABNORMAL LOW (ref 13.0–17.0)
MCH: 27.7 pg (ref 26.0–34.0)
MCHC: 33.2 g/dL (ref 30.0–36.0)
MCV: 83.6 fL (ref 78.0–100.0)
Platelets: 280 10*3/uL (ref 150–400)
RBC: 4.4 MIL/uL (ref 4.22–5.81)
RDW: 14 % (ref 11.5–15.5)
WBC: 10.6 10*3/uL — ABNORMAL HIGH (ref 4.0–10.5)

## 2011-08-08 LAB — GLUCOSE, CAPILLARY: Glucose-Capillary: 117 mg/dL — ABNORMAL HIGH (ref 70–99)

## 2011-08-09 LAB — GLUCOSE, CAPILLARY: Glucose-Capillary: 116 mg/dL — ABNORMAL HIGH (ref 70–99)

## 2011-08-16 NOTE — Cardiovascular Report (Signed)
  NAMEMarland Kitchen  Maurice Golden, Maurice Golden NO.:  [id number]  MEDICAL RECORD NO.:  [id number]  LOCATION:  2923                         FACILITY:  [facility]  PHYSICIAN:  Marca Ancona, MD      DATE OF BIRTH:  [birth year]  DATE OF PROCEDURE:  08/08/2011 DATE OF DISCHARGE:                           CARDIAC CATHETERIZATION   PROCEDURE:  Direct current cardioversion.  INDICATION:  This is a 50 year old with atrial fibrillation, rapid strep ventricular response.  He has been on Pradaxa, it is now therapeutic. He was sent today for TEE-guided cardioversion.  Initial TEE showed no left atrial appendage thrombus.  PROCEDURE IN DETAIL:  The patient was sedated by Anesthesiology using 125 mg of IV propofol.  The patient underwent direct current cardioversion using 200 joule biphasic shock, which converted rhythm to normal sinus rhythm with first shock and no complications.     Marca Ancona, MD     DM/MEDQ  D:  08/08/2011  T:  08/08/2011  Job:  [id number]  cc:   Hillis Range, MD  Electronically Signed by Marca Ancona MD on 08/16/2011 11:40:51 PM

## 2011-08-27 NOTE — H&P (Signed)
NAMEMarland Kitchen  VELMER, WOELFEL NO.:  [id number]  MEDICAL RECORD NO.:  [id number]  LOCATION:  2923                         FACILITY:  [facility]  PHYSICIAN:  Vesta Mixer, M.D. DATE OF BIRTH:  Jan 12, 1961  DATE OF ADMISSION:  08/05/2011 DATE OF DISCHARGE:                             HISTORY & PHYSICAL   PRIMARY CARE PHYSICIAN:  Cain Saupe, MD  PRIMARY CARDIOLOGIST:  Hillis Range, MD, in 2010, when he was seen for bradycardia.  CHIEF COMPLAINT:  Dizziness.  HISTORY OF PRESENT ILLNESS:  Mr. 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  He had problems with nausea and vomiting 3 days ago.  He developed dizziness in association with this.  The nausea and vomiting resolved, but the dizziness continued.  He hydrated himself using mostly liquids but continued to complain of dizziness and weakness.  The weakness and dizziness were worse when standing or walking.  The patient states he has got a "bowling ball on my head."  He feels like whenever he turns his head or tilts his head weight goes from one side to the other.  At no time did he have chest pain, shortness of breath or palpitations.  He came to the emergency room today because of ongoing symptoms.  In the emergency room, he was found to be in atrial fibrillation with rapid ventricular response.  His heart rate is still on the 130s and 140s despite being on Cardizem IV at 15 mg an hour.  PAST MEDICAL HISTORY: 1. Hypertension, recent diagnosis and well controlled per the patient. 2. Obesity. 3. History of admission for shortness of breath in May 2010, with     minimal elevation in troponins and normal CK-MBs, followup Myoview     showing no ischemia or scar and an EF of 44% with global     hypokinesis. 4. Status post echocardiogram in May 2010, showing an EF of 50-55%,     grade 1 diastolic dysfunction and moderate LVH. 5.  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 6. Severe obstructive sleep apnea with telemetry during a sleep study     showing PACs and PVCs as well as bradycardia.  SURGICAL HISTORY:  He is status post appendectomy, bilateral arthroscopic knee surgery and cataract surgery.  ALLERGIES:  No known drug allergies.  CURRENT MEDICATIONS: 1. Aspirin 81 mg a day. 2. Multivitamin daily. 3. Norvasc 5 mg a day.  SOCIAL HISTORY:  He lives in [place] with his mother.  He is retired/disabled.  He is divorced.  He denies any history of alcohol, tobacco or drug abuse.  FAMILY HISTORY:  His mother is alive with a history of cardiac catheterization but no interventions and hypertension.  His brother has no cardiac issues.  There is no history available on father.  REVIEW OF SYSTEMS:  He has not had fevers, chills or sweats.  He is not aware of any weight change.  He had nausea and vomiting 3 days ago but that has improved.  He  has been on very light food including soup and saltine crackers but not much solid foods.  He has not had diarrhea.  He gets occasional arthralgias and joint pains.  He has not had fevers, chills or sweats.  He has never had chest pain.  He denies shortness of breath.  He has not been coughing or wheezing.  Full 14-point review of systems is otherwise negative except as stated in the HPI.  PHYSICAL EXAMINATION:  VITAL SIGNS:  Temperature is 97.8, blood pressure 125/98, heart rate 161, respiratory rate 20, O2 saturation 95% on room air. GENERAL:  He is a well-developed, obese African American male in no acute distress, although his heart rate is significantly elevated. HEENT:  Normal. NECK:  There is no lymphadenopathy, thyromegaly, bruit, or JVD noted. CV:  His heart is rapid and irregular with an S1-S2.  No significant murmur, rub, or gallop is noted.  Distal pulses are intact in all  4 extremities. LUNGS:  Essentially clear to auscultation bilaterally. SKIN:  No rashes or lesions are noted. ABDOMEN:  Soft, nontender with active bowel sounds. EXTREMITIES:  There is no cyanosis, clubbing, or edema noted. MUSCULOSKELETAL:  There is no joint deformity or effusion and no spine or CVA tenderness. NEURO:  He is alert and oriented with cranial nerves II-XII grossly intact.  IMAGING:  Chest x-ray shows cardiomegaly but no acute disease.  EKG is atrial fibrillation with rapid ventricular response.  He has T- wave changes in multiple leads from an EKG dated May 2010, of unclear clinical significance.  LABORATORY VALUES:  Hemoglobin 13.6, hematocrit 40.2, WBCs 8.7, platelets 292.  Sodium 138, potassium 3.5, chloride 101, CO2 27, BUN 20, creatinine 1.1, glucose 132, INR 1.05.  TSH and free T4 are pending. Point of care troponin negative x1.  IMPRESSION:  Mr. Bruso was seen today by Dr. Elease Hashimoto, the patient evaluated and the data reviewed.  He is a 50 year old male with a new diagnosis of atrial fibrillation with rapid ventricular response.  He has had dizziness but no chest pain and denies dyspnea.  He presented to the ER with rapid atrial fibrillation.  He had a viral gastroenteritis 3 days ago.  His heart rate is 140 and his blood pressure 120/70.  He has no JVD and lungs are clear.  His heart is irregularly irregular.  His extremities have no edema.  PLAN: 1. New diagnosis of atrial fibrillation:  He will be started on     heparin and likely transition to Coumadin.  He will be continued on     IV diltiazem and we will add p.o. metoprolol for increased rate     control as well as continue     p.r.n. IV metoprolol.  We will rule out MI and check a two-D     echocardiogram.  His EKGs will be followed.  He will be continued     on CPAP at bedtime.  Further evaluation and treatment will depend     on the results of the above testing and his response to      interventions.     Theodore Demark, PA-C   ______________________________ Vesta Mixer, M.D.    RB/MEDQ  D:  08/05/2011  T:  08/06/2011  Job:  [id number]  Electronically Signed by Theodore Demark PA-C on 08/16/2011 09:45:55 PM Electronically Signed by Kristeen Miss M.D. on 08/27/2011 09:47:33 AM

## 2011-09-10 ENCOUNTER — Encounter: Payer: Self-pay | Admitting: Internal Medicine

## 2011-09-10 ENCOUNTER — Ambulatory Visit (INDEPENDENT_AMBULATORY_CARE_PROVIDER_SITE_OTHER): Payer: BC Managed Care – PPO | Admitting: Internal Medicine

## 2011-09-10 DIAGNOSIS — I4891 Unspecified atrial fibrillation: Secondary | ICD-10-CM

## 2011-09-10 DIAGNOSIS — I10 Essential (primary) hypertension: Secondary | ICD-10-CM

## 2011-09-10 DIAGNOSIS — G4733 Obstructive sleep apnea (adult) (pediatric): Secondary | ICD-10-CM

## 2011-09-10 MED ORDER — DABIGATRAN ETEXILATE MESYLATE 150 MG PO CAPS: 150.0000 mg | ORAL_CAPSULE | Freq: Two times a day (BID) | ORAL | Status: DC

## 2011-09-10 MED ORDER — AMLODIPINE BESYLATE 5 MG PO TABS: 5.0000 mg | ORAL_TABLET | Freq: Every day | ORAL | Status: DC

## 2011-09-10 NOTE — Progress Notes (Signed)
PCP:  No primary provider on file.  The patient presents today for routine electrophysiology followup. He was recently hospitalized at [facility] with atrial fibrillation.  He was placed on pradaxa and cardioverted.  He has had no further afib since that time.  He is trying to use his CPAP.  He is otherwise doing well.  Today, he denies symptoms of palpitations, chest pain, shortness of breath, orthopnea, PND, lower extremity edema, dizziness, presyncope, syncope, or neurologic sequela.  The patient feels that he is tolerating medications without difficulties and is otherwise without complaint today.   Past Medical History  Diagnosis Date  . Hypertension   . Paroxysmal atrial fibrillation   . Obstructive sleep apnea     severe  . Obesity    No past surgical history on file.  Current Outpatient Prescriptions  Medication Sig Dispense Refill  . dabigatran (PRADAXA) 150 MG CAPS Take 1 capsule (150 mg total) by mouth every 12 (twelve) hours.  60 capsule  3  . metoprolol tartrate (LOPRESSOR) 25 MG tablet Take 25 mg by mouth 2 (two) times daily.        Marland Kitchen DISCONTD: dabigatran (PRADAXA) 75 MG CAPS Take 75 mg by mouth every 12 (twelve) hours.        Marland Kitchen amLODipine (NORVASC) 5 MG tablet Take 1 tablet (5 mg total) by mouth daily.  30 tablet  11    No Known Allergies  History   Social History  . Marital Status: Single    Spouse Name: [name]    Number of Children: [name]  . Years of Education: [name]   Occupational History  . Not on file.   Social History Main Topics  . Smoking status: Never Smoker   . Smokeless tobacco: Not on file  . Alcohol Use: No  . Drug Use: No  . Sexually Active: Not on file   Other Topics Concern  . Not on file   Social History Narrative   Lives with mother in [place].  Retired/ disabled from [employer].   ROS-  All systems are reviewed and are negative except as outlined in the HPI above   Physical Exam: Filed Vitals:   09/10/11 1511  BP: 157/97  Pulse: 76    Resp: 20  Height: 6\' 3"  (1.905 m)  Weight: 308 lb (139.708 kg)    GEN- The patient is well appearing, alert and oriented x 3 today.   Head- normocephalic, atraumatic Eyes-  Sclera clear, conjunctiva pink Ears- hearing intact Oropharynx- clear Neck- supple, no JVP Lymph- no cervical lymphadenopathy Lungs- Clear to ausculation bilaterally, normal work of breathing Heart- Regular rate and rhythm, no murmurs, rubs or gallops, PMI not laterally displaced GI- soft, NT, ND, + BS Extremities- no clubbing, cyanosis, or edema MS- no significant deformity or atrophy Skin- no rash or lesion Psych- euthymic mood, full affect Neuro- strength and sensation are intact  ekg today reveals sinus rhythm with incomplete RBBB, PR 202, QRS 108, Qtc 460, nonspecific ST/T changes  Assessment and Plan:

## 2011-09-10 NOTE — Patient Instructions (Signed)
Your physician recommends that you schedule a follow-up appointment in: 2 months with Dr Johney Frame  Your physician has recommended you make the following change in your medication:  1) Start Norvasc(Amlodipine) 5mg  one daily 2) Continue the Pradaxa 150mg  one twice daily

## 2011-09-10 NOTE — Assessment & Plan Note (Signed)
Maintaining sinus rhythm off of AAD Continue Pradaxa (CHADS2 score is 1) No changes today

## 2011-09-10 NOTE — Assessment & Plan Note (Signed)
Compliance with CPAP encouraged 

## 2011-09-10 NOTE — Assessment & Plan Note (Signed)
Weight loss advised 

## 2011-09-10 NOTE — Assessment & Plan Note (Signed)
Above goal norvasc 5mg  daily restarted today

## 2011-10-02 ENCOUNTER — Telehealth: Payer: Self-pay | Admitting: Internal Medicine

## 2011-10-02 NOTE — Telephone Encounter (Signed)
New msg Pt doesn't remember if he took metoprolol this morning and he takes twice a day Please call him back

## 2011-10-02 NOTE — Telephone Encounter (Signed)
Called patient back  He can not remember if he took his Metoprolol or not this morning He is going to take his pm dose today at 5pm and get back on track tomorrow morning  I tried to reassure him that this was okay, but he seemed nervous about "messing things up"

## 2011-11-06 ENCOUNTER — Telehealth: Payer: Self-pay | Admitting: Internal Medicine

## 2011-11-06 NOTE — Telephone Encounter (Signed)
Pt needs a call because he needs some info regarding his last procedure sent to his insurance company

## 2011-11-06 NOTE — Telephone Encounter (Signed)
Called patient and spoke with him regarding his questions.  [employer] is the company  Needs information on last visit with Dr Johney Frame

## 2011-11-11 NOTE — Telephone Encounter (Signed)
Kim in MR says we received form from [employer] on 11/04/11 and was faxed to [employer].  On 11/05/11 a payment packet and release of information form was went to patient to complete  All this has to be filled out and sent back to [employer] with payment before able to process

## 2011-11-14 ENCOUNTER — Telehealth: Payer: Self-pay | Admitting: Internal Medicine

## 2011-11-14 ENCOUNTER — Encounter: Payer: Self-pay | Admitting: *Deleted

## 2011-11-14 ENCOUNTER — Encounter: Payer: Self-pay | Admitting: Internal Medicine

## 2011-11-14 NOTE — Telephone Encounter (Signed)
LOv,Echo,Stress,12 faxed to [facility]  @ [phone]  11/14/11/KM

## 2011-11-17 ENCOUNTER — Ambulatory Visit (INDEPENDENT_AMBULATORY_CARE_PROVIDER_SITE_OTHER): Payer: BC Managed Care – PPO | Admitting: Internal Medicine

## 2011-11-17 ENCOUNTER — Encounter: Payer: Self-pay | Admitting: Internal Medicine

## 2011-11-17 VITALS — BP 150/80 | HR 88 | Resp 18 | Ht 75.0 in | Wt 302.0 lb

## 2011-11-17 DIAGNOSIS — G4733 Obstructive sleep apnea (adult) (pediatric): Secondary | ICD-10-CM

## 2011-11-17 DIAGNOSIS — I10 Essential (primary) hypertension: Secondary | ICD-10-CM

## 2011-11-17 DIAGNOSIS — I4891 Unspecified atrial fibrillation: Secondary | ICD-10-CM

## 2011-11-17 DIAGNOSIS — R9431 Abnormal electrocardiogram [ECG] [EKG]: Secondary | ICD-10-CM

## 2011-11-17 MED ORDER — HYDROCHLOROTHIAZIDE 12.5 MG PO CAPS: 12.5000 mg | ORAL_CAPSULE | Freq: Every day | ORAL | Status: DC

## 2011-11-17 NOTE — Assessment & Plan Note (Signed)
Above goal Given edema, will not increase norvasc at this time Add hctz 12.5mg  daily Return in 3 months for follow up

## 2011-11-17 NOTE — Assessment & Plan Note (Signed)
Continue to use CPAP 

## 2011-11-17 NOTE — Patient Instructions (Signed)
Your physician wants you to follow-up in: 3 months with Dr. Johney Frame. You will receive a reminder letter in the mail two months in advance. If you don't receive a letter, please call our office to schedule the follow-up appointment.  Your physician has recommended you make the following change in your medication:  1) Start Hydrochlorothiazide (HCTZ) 12.5 mg one tablet by mouth once daily.

## 2011-11-17 NOTE — Progress Notes (Signed)
PCP:  Candi Leash, MD, MD  The patient presents today for routine electrophysiology followup.  He has had no further afib since his last visit.  His energy has significantly improved with CPAP.  He reports mild edema with norvasc.  He is otherwise doing well.  Today, he denies symptoms of palpitations, chest pain, shortness of breath, orthopnea, PND, lower extremity edema, dizziness, presyncope, syncope, or neurologic sequela.  The patient is otherwise without complaint today.   Past Medical History  Diagnosis Date  . Hypertension   . Paroxysmal atrial fibrillation   . Obstructive sleep apnea     severe  . Obesity   . ANEMIA   . BRADYCARDIA    Past Surgical History  Procedure Date  . Bilateral knee arthroscopy   . Appendectomy   . L eye cataract     Current Outpatient Prescriptions  Medication Sig Dispense Refill  . amLODipine (NORVASC) 5 MG tablet Take 1 tablet (5 mg total) by mouth daily.  30 tablet  11  . metoprolol tartrate (LOPRESSOR) 25 MG tablet Take 25 mg by mouth 2 (two) times daily.        . hydrochlorothiazide (MICROZIDE) 12.5 MG capsule Take 1 capsule (12.5 mg total) by mouth daily.  30 capsule  6    No Known Allergies  History   Social History  . Marital Status: Single    Spouse Name: [name]    Number of Children: [name]  . Years of Education: [name]   Occupational History  . Not on file.   Social History Main Topics  . Smoking status: Never Smoker   . Smokeless tobacco: Not on file  . Alcohol Use: No  . Drug Use: No  . Sexually Active: Not on file   Other Topics Concern  . Not on file   Social History Narrative   Lives with mother in [place].  Retired/ disabled from [employer].   ROS-  All systems are reviewed and are negative except as outlined in the HPI above   Physical Exam: Filed Vitals:   11/17/11 1523  BP: 150/80  Pulse: 88  Resp: 18  Height: 6\' 3"  (1.905 m)  Weight: 302 lb (136.986 kg)    GEN- The patient is well appearing, alert and  oriented x 3 today.   Head- normocephalic, atraumatic Eyes-  Sclera clear, conjunctiva pink Ears- hearing intact Oropharynx- clear Neck- supple, no JVP Lymph- no cervical lymphadenopathy Lungs- Clear to ausculation bilaterally, normal work of breathing Heart- Regular rate and rhythm, no murmurs, rubs or gallops, PMI not laterally displaced GI- soft, NT, ND, + BS Extremities- no clubbing, cyanosis, trace edema MS- no significant deformity or atrophy Skin- no rash or lesion Psych- euthymic mood, full affect Neuro- strength and sensation are intact  ekg today reveals sinus rhythm with PR 208, QRS 94, Qtc 470, poor R wave progression more prominent than last ekg , LAD,  nonspecific ST/T changes  Assessment and Plan:

## 2011-11-17 NOTE — Assessment & Plan Note (Signed)
Poor R wave progression noted today, more than on prior ekg No ischemic symptoms EF recently normal  Will follow clinically No indication for stress testing at this time. He will contact me if ischemic symptoms occur

## 2011-11-17 NOTE — Assessment & Plan Note (Signed)
Maintaining sinus  Continue pradaxa

## 2012-01-12 ENCOUNTER — Other Ambulatory Visit: Payer: Self-pay | Admitting: *Deleted

## 2012-01-12 DIAGNOSIS — I4891 Unspecified atrial fibrillation: Secondary | ICD-10-CM

## 2012-01-12 MED ORDER — METOPROLOL TARTRATE 25 MG PO TABS: 25.0000 mg | ORAL_TABLET | Freq: Two times a day (BID) | ORAL | Status: DC

## 2012-01-12 MED ORDER — HYDROCHLOROTHIAZIDE 12.5 MG PO CAPS: 12.5000 mg | ORAL_CAPSULE | Freq: Every day | ORAL | Status: DC

## 2012-04-21 DIAGNOSIS — Z961 Presence of intraocular lens: Secondary | ICD-10-CM | POA: Diagnosis not present

## 2012-04-26 ENCOUNTER — Encounter: Payer: Self-pay | Admitting: *Deleted

## 2012-04-26 ENCOUNTER — Encounter: Payer: Self-pay | Admitting: Internal Medicine

## 2012-04-26 ENCOUNTER — Ambulatory Visit (INDEPENDENT_AMBULATORY_CARE_PROVIDER_SITE_OTHER): Payer: Medicare Other | Admitting: Internal Medicine

## 2012-04-26 VITALS — BP 130/62 | HR 70 | Resp 18 | Ht 75.0 in | Wt 297.0 lb

## 2012-04-26 DIAGNOSIS — G4733 Obstructive sleep apnea (adult) (pediatric): Secondary | ICD-10-CM | POA: Diagnosis not present

## 2012-04-26 DIAGNOSIS — I4891 Unspecified atrial fibrillation: Secondary | ICD-10-CM

## 2012-04-26 DIAGNOSIS — I10 Essential (primary) hypertension: Secondary | ICD-10-CM | POA: Diagnosis not present

## 2012-04-26 MED ORDER — RIVAROXABAN 20 MG PO TABS: 20.0000 mg | ORAL_TABLET | Freq: Every day | ORAL | Status: DC

## 2012-04-26 MED ORDER — METOPROLOL TARTRATE 50 MG PO TABS: 50.0000 mg | ORAL_TABLET | Freq: Two times a day (BID) | ORAL | Status: DC

## 2012-04-26 NOTE — Assessment & Plan Note (Addendum)
The patient has returned to atrial fibrillation and ventricular rates are elevated.  Though he is clearly symptomatic, he appears stable for outpatient treatment. I will increase metoprolol to 50mg  BID.  In addition, I will start xarelto 20mg  daily.  After loading with 3 doses, I will arrange for TEE guided cardioversion later in the week.  Risks, benefits, and alternatives to TEE guided cardioversion were discussed with the patient who wishes to proceed.  Once in sinus rhythm, he will require initiation of an antiarrhythmic medicine.  As he has no known CAD, I think that flecainide 100mg  BID would be a reasonable option.  He would require GXT myoview to exclude ischemia after initiation of flecainide (in several weeks). He should followup with one of our extenders within 2 weeks post cardioversion.  In the interim, he is instructed to not exercise or exert himself.  If he develops chest pain, shortness of breath, presyncope/ syncope, or any other concerns then he is aware to go to the ER immediately.

## 2012-04-26 NOTE — Assessment & Plan Note (Signed)
Compliance with CPAP will be important to maintain sinus rhythm long term

## 2012-04-26 NOTE — Patient Instructions (Addendum)
Your physician has recommended that you have a Cardioversion (DCCV). Electrical Cardioversion uses a jolt of electricity to your heart either through paddles or wired patches attached to your chest. This is a controlled, usually prescheduled, procedure. Defibrillation is done under light anesthesia in the hospital, and you usually go home the day of the procedure. This is done to get your heart back into a normal rhythm. You are not awake for the procedure. Please see the instruction sheet given to you today.  Your physician has recommended you make the following change in your medication:  1) Increase Metoprolol to 50mg  twice daily  2)Start Xarelto 20mg  daily

## 2012-04-26 NOTE — Progress Notes (Signed)
PCP:  FULP, CAMMIE, MD  The patient presents today for electrophysiology followup.  He is not doing well today.  He states that he has felt "Bad" for several days.  He reports tachypalpitations with fatigue and decreased exercise tolerance.  He states that he wants to go home and lie down.  He reports compliance with medical therapy.  Today, he denies symptoms of chest pain, orthopnea, PND, lower extremity edema, dizziness, presyncope, syncope, or neurologic sequela.  The patient is otherwise without complaint today.   Past Medical History  Diagnosis Date  . Hypertension   . Paroxysmal atrial fibrillation   . Obstructive sleep apnea     severe  . Obesity   . ANEMIA   . BRADYCARDIA    Past Surgical History  Procedure Date  . Bilateral knee arthroscopy   . Appendectomy   . L eye cataract     Current Outpatient Prescriptions  Medication Sig Dispense Refill  . hydrochlorothiazide (MICROZIDE) 12.5 MG capsule Take 1 capsule (12.5 mg total) by mouth daily.  90 capsule  2  . metoprolol tartrate (LOPRESSOR) 50 MG tablet Take 1 tablet (50 mg total) by mouth 2 (two) times daily.  180 tablet  2  . DISCONTD: metoprolol tartrate (LOPRESSOR) 25 MG tablet Take 1 tablet (25 mg total) by mouth 2 (two) times daily.  180 tablet  2  . Rivaroxaban (XARELTO) 20 MG TABS Take 1 tablet (20 mg total) by mouth daily.  30 tablet  1  . DISCONTD: Rivaroxaban (XARELTO) 20 MG TABS Take 1 tablet (20 mg total) by mouth daily.  30 tablet  1    No Known Allergies  History   Social History  . Marital Status: Single    Spouse Name: [name]    Number of Children: [name]  . Years of Education: [name]   Occupational History  . Not on file.   Social History Main Topics  . Smoking status: Never Smoker   . Smokeless tobacco: Not on file  . Alcohol Use: No  . Drug Use: No  . Sexually Active: Not on file   Other Topics Concern  . Not on file   Social History Narrative   Lives with mother in [place].  Retired/  disabled from [employer].   ROS-  All systems are reviewed and are negative except as outlined in the HPI above   Physical Exam: Filed Vitals:   04/26/12 1607  BP: 130/62  Pulse: 70  Resp: 18  Height: 6' 3" (1.905 m)  Weight: 297 lb (134.718 kg)  SpO2: 98%    GEN- The patient is tired appearing, alert and oriented x 3 today.   Head- normocephalic, atraumatic Eyes-  Sclera clear, conjunctiva pink Ears- hearing intact Oropharynx- clear Neck- supple,  Lungs- Clear to ausculation bilaterally, normal work of breathing Heart- tachy irregular rhythm, no murmurs, rubs or gallops, PMI not laterally displaced GI- soft, NT, ND, + BS Extremities- no clubbing, cyanosis, trace edema MS- no significant deformity or atrophy Skin- no rash or lesion Psych- euthymic mood, full affect Neuro- strength and sensation are intact  ekg today afib, V rates 146 bpm  Assessment and Plan:  

## 2012-04-26 NOTE — Assessment & Plan Note (Signed)
Stable No change required today  

## 2012-04-27 ENCOUNTER — Telehealth: Payer: Self-pay | Admitting: *Deleted

## 2012-04-27 LAB — CBC WITH DIFFERENTIAL/PLATELET
Basophils Absolute: 0 10*3/uL (ref 0.0–0.1)
Basophils Relative: 0.7 % (ref 0.0–3.0)
Eosinophils Absolute: 0.1 10*3/uL (ref 0.0–0.7)
Eosinophils Relative: 1.5 % (ref 0.0–5.0)
HCT: 39.9 % (ref 39.0–52.0)
Hemoglobin: 13.1 g/dL (ref 13.0–17.0)
Lymphocytes Relative: 42.9 % (ref 12.0–46.0)
Lymphs Abs: 2.7 10*3/uL (ref 0.7–4.0)
MCHC: 32.8 g/dL (ref 30.0–36.0)
MCV: 86.8 fl (ref 78.0–100.0)
Monocytes Absolute: 0.7 10*3/uL (ref 0.1–1.0)
Monocytes Relative: 11.2 % (ref 3.0–12.0)
Neutro Abs: 2.7 10*3/uL (ref 1.4–7.7)
Neutrophils Relative %: 43.7 % (ref 43.0–77.0)
Platelets: 293 10*3/uL (ref 150.0–400.0)
RBC: 4.6 Mil/uL (ref 4.22–5.81)
RDW: 14.2 % (ref 11.5–14.6)
WBC: 6.3 10*3/uL (ref 4.5–10.5)

## 2012-04-27 LAB — BASIC METABOLIC PANEL
BUN: 23 mg/dL (ref 6–23)
CO2: 29 mEq/L (ref 19–32)
Calcium: 9.4 mg/dL (ref 8.4–10.5)
Chloride: 107 mEq/L (ref 96–112)
Creatinine, Ser: 1.2 mg/dL (ref 0.4–1.5)
GFR: 84.35 mL/min (ref >60.00)
Glucose, Bld: 97 mg/dL (ref 70–99)
Potassium: 4.2 mEq/L (ref 3.5–5.1)
Sodium: 140 mEq/L (ref 135–145)

## 2012-04-27 NOTE — Telephone Encounter (Signed)
lmom for patient to call me in regards to scheduling his TEE guided DCCV for 7/18

## 2012-04-27 NOTE — Telephone Encounter (Signed)
Pt rtn kelly call [phone]

## 2012-04-27 NOTE — Telephone Encounter (Signed)
Patient aware of time to be at the hospital.  He is going to come tomorrow for an EKG  He says he is feeling much better and does not feel like his heart is fluttering anymore.  I will see him tomorrow at 3pm and we can cx DCCV if necessary

## 2012-04-28 ENCOUNTER — Ambulatory Visit (INDEPENDENT_AMBULATORY_CARE_PROVIDER_SITE_OTHER): Payer: BC Managed Care – PPO | Admitting: *Deleted

## 2012-04-28 DIAGNOSIS — I4891 Unspecified atrial fibrillation: Secondary | ICD-10-CM | POA: Diagnosis not present

## 2012-04-29 ENCOUNTER — Other Ambulatory Visit: Payer: Self-pay | Admitting: Internal Medicine

## 2012-04-29 ENCOUNTER — Encounter (HOSPITAL_COMMUNITY)
Admission: RE | Disposition: A | Discharge status: Home / Self Care | Payer: Self-pay | Source: Ambulatory Visit | Attending: Cardiovascular Disease

## 2012-04-29 ENCOUNTER — Ambulatory Visit (HOSPITAL_COMMUNITY)
Admission: RE | Admit: 2012-04-29 | Discharge: 2012-04-29 | Disposition: A | Discharge status: Home / Self Care | Payer: BC Managed Care – PPO | Source: Ambulatory Visit | Attending: Cardiovascular Disease | Admitting: Cardiovascular Disease

## 2012-04-29 ENCOUNTER — Encounter (HOSPITAL_COMMUNITY): Payer: Self-pay | Admitting: Certified Registered Nurse Anesthetist

## 2012-04-29 ENCOUNTER — Ambulatory Visit (HOSPITAL_COMMUNITY): Payer: BC Managed Care – PPO | Admitting: Certified Registered Nurse Anesthetist

## 2012-04-29 DIAGNOSIS — I4891 Unspecified atrial fibrillation: Secondary | ICD-10-CM

## 2012-04-29 HISTORY — PX: CARDIOVERSION: SHX1299

## 2012-04-29 HISTORY — PX: TEE WITHOUT CARDIOVERSION: SHX5443

## 2012-04-29 SURGERY — ECHOCARDIOGRAM, TRANSESOPHAGEAL
Anesthesia: Moderate Sedation

## 2012-04-29 MED ORDER — MIDAZOLAM HCL 10 MG/2ML IJ SOLN
INTRAMUSCULAR | Status: DC | PRN
  Administered 2012-04-29 (×2): 2 mg via INTRAVENOUS

## 2012-04-29 MED ORDER — LIDOCAINE HCL (CARDIAC) 20 MG/ML IV SOLN
INTRAVENOUS | Status: DC | PRN
  Administered 2012-04-29: 40 mg via INTRAVENOUS

## 2012-04-29 MED ORDER — SODIUM CHLORIDE 0.9 % IV SOLN
INTRAVENOUS | Status: DC
  Administered 2012-04-29: 500 mL via INTRAVENOUS
  Administered 2012-04-29: 15:00:00 via INTRAVENOUS

## 2012-04-29 MED ORDER — FLUMAZENIL 0.5 MG/5ML IV SOLN
INTRAVENOUS | Status: AC
  Filled 2012-04-29: qty 5

## 2012-04-29 MED ORDER — PROPOFOL 10 MG/ML IV BOLUS
INTRAVENOUS | Status: DC | PRN
  Administered 2012-04-29: 75 mg via INTRAVENOUS

## 2012-04-29 MED ORDER — FENTANYL CITRATE 0.05 MG/ML IJ SOLN
INTRAMUSCULAR | Status: AC
  Filled 2012-04-29: qty 2

## 2012-04-29 MED ORDER — MIDAZOLAM HCL 10 MG/2ML IJ SOLN
INTRAMUSCULAR | Status: AC
  Filled 2012-04-29: qty 2

## 2012-04-29 MED ORDER — BUTAMBEN-TETRACAINE-BENZOCAINE 2-2-14 % EX AERO
INHALATION_SPRAY | CUTANEOUS | Status: DC | PRN
  Administered 2012-04-29: 2 via TOPICAL

## 2012-04-29 MED ORDER — SODIUM CHLORIDE 0.9 % IV SOLN
INTRAVENOUS | Status: DC | PRN
  Administered 2012-04-29: 15:00:00 via INTRAVENOUS

## 2012-04-29 MED ORDER — FENTANYL CITRATE 0.05 MG/ML IJ SOLN
INTRAMUSCULAR | Status: DC | PRN
  Administered 2012-04-29: 25 ug via INTRAVENOUS
  Administered 2012-04-29: 50 ug via INTRAVENOUS

## 2012-04-29 MED ORDER — FLUMAZENIL 0.5 MG/5ML IV SOLN
INTRAVENOUS | Status: DC | PRN
  Administered 2012-04-29: .5 mg via INTRAVENOUS

## 2012-04-29 NOTE — Telephone Encounter (Signed)
Called patient back and lmom

## 2012-04-29 NOTE — Telephone Encounter (Signed)
Pt rtn call to kelly, needs call back asap

## 2012-04-29 NOTE — CV Procedure (Signed)
    Transesophageal Echocardiogram Note  Maurice Golden [id number] August 01, 1961  Procedure: Transesophageal Echocardiogram Indications: atrial fibrillation  Procedure Details Consent: Obtained Time Out: Verified patient identification, verified procedure, site/side was marked, verified correct patient position, special equipment/implants available, Radiology Safety Procedures followed,  medications/allergies/relevent history reviewed, required imaging and test results available.  Performed  Medications: Fentanyl: 75 mcg IV Versed: 4 mg IV  Left Ventrical:  Mild-moderate LV dysfunction.  EF of 35-40%  Mitral Valve: trace MR  Aortic Valve: 3 leaflet valve.  Trace AI  Tricuspid Valve: Trace - mild TR  Pulmonic Valve: normal, no PI  Left Atrium/ Left atrial appendage: no thrmbus,  Good function  Atrial septum: bowing left to right.  There is a PFO with almost constant left to right flow by color Doppler and occasional Right to left by bubble study - especially with deep breath  Aorta: normal.   Complications: No apparent complications Patient did tolerate procedure well.   Vesta Mixer, Montez Hageman., MD, [facility] 04/29/2012, 2:43 PM   Will proceed with cardioversion.   Cardioversion Note  Maurice Golden [id number] 01/18/1961  Procedure: DC Cardioversion Indications: atrial fibrillation  Procedure Details Consent: Obtained Time Out: Verified patient identification, verified procedure, site/side was marked, verified correct patient position, special equipment/implants available, Radiology Safety Procedures followed,  medications/allergies/relevent history reviewed, required imaging and test results available.  Performed  The patient has been on adequate anticoagulation.  The patient received IV Lidocaine 47.5 mg and Propofol 75 for sedation.  Synchronous cardioversion was performed at 200 joules.  The cardioversion was successful.    Complications: No apparent  complications Patient did tolerate procedure well.   Vesta Mixer, Montez Hageman., MD, [facility] 04/29/2012, 2:43 PM

## 2012-04-29 NOTE — Progress Notes (Signed)
  Echocardiogram Echocardiogram Transesophageal has been performed.  Maurice Golden 04/29/2012, 2:45 PM

## 2012-04-29 NOTE — Transfer of Care (Signed)
Immediate Anesthesia Transfer of Care Note  Patient: Maurice Golden  Procedure(s) Performed: Procedure(s) (LRB): TRANSESOPHAGEAL ECHOCARDIOGRAM (TEE) ([name]) CARDIOVERSION ([name])  Patient Location: Short Stay  Anesthesia Type: General  Level of Consciousness: awake, alert , oriented and patient cooperative  Airway & Oxygen Therapy: Patient Spontanous Breathing and Patient connected to nasal cannula oxygen  Post-op Assessment: Report given to PACU RN, Post -op Vital signs reviewed and stable and Patient moving all extremities X 4  Post vital signs: Reviewed and stable  Complications: No apparent anesthesia complications

## 2012-04-29 NOTE — Addendum Note (Signed)
Addendum  created 04/29/12 1523 by Sharlet Salina, CRNA   Modules edited:Notes Section

## 2012-04-29 NOTE — Anesthesia Postprocedure Evaluation (Signed)
  Anesthesia Post-op Note  Patient: Maurice Golden  Procedure(s) Performed: Procedure(s) (LRB): TRANSESOPHAGEAL ECHOCARDIOGRAM (TEE) ([name]) CARDIOVERSION ([name])  Patient Location: PACU  Anesthesia Type: General  Level of Consciousness: awake, alert , sedated and patient cooperative  Airway and Oxygen Therapy: Patient Spontanous Breathing and Patient connected to nasal cannula oxygen  Post-op Pain: none  Post-op Assessment: Post-op Vital signs reviewed, Patient's Cardiovascular Status Stable, Respiratory Function Stable, Patent Airway, No signs of Nausea or vomiting and Pain level controlled  Post-op Vital Signs: stable  Complications: No apparent anesthesia complications

## 2012-04-29 NOTE — OR Nursing (Signed)
Patient hypertensive with BP 190/120 Dr. Melburn Popper aware.  No new orders

## 2012-04-29 NOTE — Anesthesia Preprocedure Evaluation (Addendum)
Anesthesia Evaluation  Patient identified by MRN, date of birth, ID band  Reviewed: Allergy & Precautions, H&P , NPO status , Patient's Chart, lab work & pertinent test results  Airway Mallampati: I TM Distance: >3 FB Neck ROM: Full    Dental  (+) Teeth Intact and Dental Advisory Given   Pulmonary sleep apnea and Continuous Positive Airway Pressure Ventilation ,          Cardiovascular hypertension, + dysrhythmias Atrial Fibrillation  EF 35-40% trivial MR   Neuro/Psych negative neurological ROS  negative psych ROS   GI/Hepatic negative GI ROS, Neg liver ROS,   Endo/Other    Renal/GU negative Renal ROS     Musculoskeletal   Abdominal   Peds  Hematology   Anesthesia Other Findings   Reproductive/Obstetrics                          Anesthesia Physical Anesthesia Plan  ASA: III  Anesthesia Plan: General   Post-op Pain Management:    Induction: Intravenous  Airway Management Planned:   Additional Equipment:   Intra-op Plan:   Post-operative Plan:   Informed Consent:   Plan Discussed with:   Anesthesia Plan Comments:         Anesthesia Quick Evaluation

## 2012-04-29 NOTE — Anesthesia Postprocedure Evaluation (Signed)
  Anesthesia Post-op Note  Patient: Maurice Golden  Procedure(s) Performed: Procedure(s) (LRB): TRANSESOPHAGEAL ECHOCARDIOGRAM (TEE) ([name]) CARDIOVERSION ([name])  Patient Location: Short Stay  Anesthesia Type: General  Level of Consciousness: awake, alert  and oriented  Airway and Oxygen Therapy: Patient Spontanous Breathing and Patient connected to nasal cannula oxygen  Post-op Pain: none  Post-op Assessment: Post-op Vital signs reviewed, Patient's Cardiovascular Status Stable, Respiratory Function Stable and Patent Airway  Post-op Vital Signs: Reviewed and stable  Complications: No apparent anesthesia complications

## 2012-04-29 NOTE — Interval H&P Note (Signed)
History and Physical Interval Note:  04/29/2012 2:19 PM  Maurice Golden  has presented today for surgery, with the diagnosis of afib  The various methods of treatment have been discussed with the patient and family. After consideration of risks, benefits and other options for treatment, the patient has consented to  Procedure(s) (LRB): TRANSESOPHAGEAL ECHOCARDIOGRAM (TEE) ([name]) CARDIOVERSION ([name]) as a surgical intervention .  The patient's history has been reviewed, patient examined, no change in status, stable for surgery.  I have reviewed the patients' chart and labs.  Questions were answered to the patient's satisfaction.     Elyn Aquas.

## 2012-04-29 NOTE — H&P (View-Only) (Signed)
PCP:  Cain Saupe, MD  The patient presents today for electrophysiology followup.  He is not doing well today.  He states that he has felt "Bad" for several days.  He reports tachypalpitations with fatigue and decreased exercise tolerance.  He states that he wants to go home and lie down.  He reports compliance with medical therapy.  Today, he denies symptoms of chest pain, orthopnea, PND, lower extremity edema, dizziness, presyncope, syncope, or neurologic sequela.  The patient is otherwise without complaint today.   Past Medical History  Diagnosis Date  . Hypertension   . Paroxysmal atrial fibrillation   . Obstructive sleep apnea     severe  . Obesity   . ANEMIA   . BRADYCARDIA    Past Surgical History  Procedure Date  . Bilateral knee arthroscopy   . Appendectomy   . L eye cataract     Current Outpatient Prescriptions  Medication Sig Dispense Refill  . hydrochlorothiazide (MICROZIDE) 12.5 MG capsule Take 1 capsule (12.5 mg total) by mouth daily.  90 capsule  2  . metoprolol tartrate (LOPRESSOR) 50 MG tablet Take 1 tablet (50 mg total) by mouth 2 (two) times daily.  180 tablet  2  . DISCONTD: metoprolol tartrate (LOPRESSOR) 25 MG tablet Take 1 tablet (25 mg total) by mouth 2 (two) times daily.  180 tablet  2  . Rivaroxaban (XARELTO) 20 MG TABS Take 1 tablet (20 mg total) by mouth daily.  30 tablet  1  . DISCONTD: Rivaroxaban (XARELTO) 20 MG TABS Take 1 tablet (20 mg total) by mouth daily.  30 tablet  1    No Known Allergies  History   Social History  . Marital Status: Single    Spouse Name: [name]    Number of Children: [name]  . Years of Education: [name]   Occupational History  . Not on file.   Social History Main Topics  . Smoking status: Never Smoker   . Smokeless tobacco: Not on file  . Alcohol Use: No  . Drug Use: No  . Sexually Active: Not on file   Other Topics Concern  . Not on file   Social History Narrative   Lives with mother in [place].  Retired/  disabled from [employer].   ROS-  All systems are reviewed and are negative except as outlined in the HPI above   Physical Exam: Filed Vitals:   04/26/12 1607  BP: 130/62  Pulse: 70  Resp: 18  Height: 6\' 3"  (1.905 m)  Weight: 297 lb (134.718 kg)  SpO2: 98%    GEN- The patient is tired appearing, alert and oriented x 3 today.   Head- normocephalic, atraumatic Eyes-  Sclera clear, conjunctiva pink Ears- hearing intact Oropharynx- clear Neck- supple,  Lungs- Clear to ausculation bilaterally, normal work of breathing Heart- tachy irregular rhythm, no murmurs, rubs or gallops, PMI not laterally displaced GI- soft, NT, ND, + BS Extremities- no clubbing, cyanosis, trace edema MS- no significant deformity or atrophy Skin- no rash or lesion Psych- euthymic mood, full affect Neuro- strength and sensation are intact  ekg today afib, V rates 146 bpm  Assessment and Plan:

## 2012-04-30 NOTE — Telephone Encounter (Signed)
F/U   Patient calling asking should he continue to take his metoprolol.

## 2012-04-30 NOTE — Telephone Encounter (Signed)
Patient called stated he had a cardioversion yesterday 04/29/12 and was talk of changing metoprolol dose.States he is taking 50 mg twice daily.Patient wants to know how he should be taking.Patient was told Dr.Allred not in office today will send message to [facility] for advice.

## 2012-05-03 ENCOUNTER — Encounter: Payer: Self-pay | Admitting: Internal Medicine

## 2012-05-03 ENCOUNTER — Encounter (HOSPITAL_COMMUNITY): Payer: Self-pay | Admitting: Cardiovascular Disease

## 2012-05-03 NOTE — Telephone Encounter (Signed)
Decrease metoprolol to 25mg  BID Continue xarleto x 4 weeks post cardioversion  Repeat 2D echo in 2 weeks.  IF EF is normal at that time, he should begin flecainide 100mg  BID.  IF EF remains depressed then we may have to consider tikosyn instead.

## 2012-05-03 NOTE — Telephone Encounter (Signed)
Decrease metoprolol to 25mg  BID Continue xarleto x 4 weeks post cardioversion

## 2012-05-03 NOTE — Telephone Encounter (Signed)
Spoke with patient and let him know I will call him and let him tomorrow after discussing the Metoprolol and Xarelto.  How long does he need to continue Xarelto after the DCCV and can he decrease his Metoprolol back to 25mg  bid

## 2012-05-03 NOTE — Telephone Encounter (Signed)
Maurice Golden 05/03/2012 11:33 AM Signed  Please return call to patient at [phone], he has questions about medical care.

## 2012-05-03 NOTE — Telephone Encounter (Signed)
Please return call to patient at [phone], he has questions about medical care.

## 2012-05-03 NOTE — Telephone Encounter (Signed)
This encounter was created in error - please disregard.

## 2012-05-04 MED ORDER — METOPROLOL TARTRATE 50 MG PO TABS: 25.0000 mg | ORAL_TABLET | Freq: Two times a day (BID) | ORAL | Status: DC

## 2012-05-11 ENCOUNTER — Telehealth: Payer: Self-pay | Admitting: Internal Medicine

## 2012-05-11 MED ORDER — METOPROLOL TARTRATE 25 MG PO TABS: 25.0000 mg | ORAL_TABLET | Freq: Two times a day (BID) | ORAL | Status: DC

## 2012-05-11 NOTE — Telephone Encounter (Signed)
Patient called and wanted me to call in his Metoprolol for the 25mg  tablet  I have sent in the new rx

## 2012-05-11 NOTE — Telephone Encounter (Signed)
New Problem:    Patient called in wanting to ask you a question about his medication.  When asked, patient declined to list the name of his medication stating that we have it on file.  Please call back.

## 2012-05-12 ENCOUNTER — Telehealth: Payer: Self-pay | Admitting: Internal Medicine

## 2012-05-12 MED ORDER — METOPROLOL TARTRATE 25 MG PO TABS: 25.0000 mg | ORAL_TABLET | Freq: Two times a day (BID) | ORAL | Status: DC

## 2012-05-12 NOTE — Telephone Encounter (Signed)
Fax Received. Refill Completed. Maurice Golden (R.M.A)   

## 2012-05-12 NOTE — Telephone Encounter (Signed)
New Problem:    Patient called in needing a 90 day refill of his metoprolol (LOPRESSOR) 25 MG tablet.  Please call back once the order has been placed.

## 2012-05-12 NOTE — Telephone Encounter (Signed)
Follow-up:    Patient called in stating that he is out of his medication and needs a refill and a call back once the order has been placed today.  Please call back.

## 2012-05-20 ENCOUNTER — Ambulatory Visit (HOSPITAL_COMMUNITY): Payer: Medicare Other | Attending: Cardiology | Admitting: Radiology

## 2012-05-20 ENCOUNTER — Other Ambulatory Visit: Payer: Self-pay

## 2012-05-20 DIAGNOSIS — I10 Essential (primary) hypertension: Secondary | ICD-10-CM | POA: Insufficient documentation

## 2012-05-20 DIAGNOSIS — I517 Cardiomegaly: Secondary | ICD-10-CM | POA: Diagnosis not present

## 2012-05-20 DIAGNOSIS — I4891 Unspecified atrial fibrillation: Secondary | ICD-10-CM

## 2012-05-20 DIAGNOSIS — I498 Other specified cardiac arrhythmias: Secondary | ICD-10-CM | POA: Diagnosis not present

## 2012-05-20 DIAGNOSIS — E669 Obesity, unspecified: Secondary | ICD-10-CM | POA: Insufficient documentation

## 2012-05-20 NOTE — Progress Notes (Signed)
Echocardiogram performed.  

## 2012-07-15 DIAGNOSIS — Z125 Encounter for screening for malignant neoplasm of prostate: Secondary | ICD-10-CM | POA: Diagnosis not present

## 2012-07-15 DIAGNOSIS — I10 Essential (primary) hypertension: Secondary | ICD-10-CM | POA: Diagnosis not present

## 2012-07-15 DIAGNOSIS — Z79899 Other long term (current) drug therapy: Secondary | ICD-10-CM | POA: Diagnosis not present

## 2012-07-15 DIAGNOSIS — G4733 Obstructive sleep apnea (adult) (pediatric): Secondary | ICD-10-CM | POA: Diagnosis not present

## 2012-07-23 ENCOUNTER — Telehealth: Payer: Self-pay | Admitting: Internal Medicine

## 2012-07-23 NOTE — Telephone Encounter (Signed)
Pt calling re having some beer while on meds?

## 2012-07-23 NOTE — Telephone Encounter (Signed)
Called and lmom that 1-2 beers would be ok but would not advise over that

## 2012-08-11 ENCOUNTER — Ambulatory Visit (INDEPENDENT_AMBULATORY_CARE_PROVIDER_SITE_OTHER): Payer: Medicare Other | Admitting: Internal Medicine

## 2012-08-11 ENCOUNTER — Encounter: Payer: Self-pay | Admitting: *Deleted

## 2012-08-11 VITALS — BP 146/92 | HR 137 | Ht 75.0 in | Wt 291.0 lb

## 2012-08-11 DIAGNOSIS — G4733 Obstructive sleep apnea (adult) (pediatric): Secondary | ICD-10-CM | POA: Diagnosis not present

## 2012-08-11 DIAGNOSIS — I4891 Unspecified atrial fibrillation: Secondary | ICD-10-CM | POA: Diagnosis not present

## 2012-08-11 DIAGNOSIS — I10 Essential (primary) hypertension: Secondary | ICD-10-CM

## 2012-08-11 MED ORDER — METOPROLOL TARTRATE 50 MG PO TABS: 25.0000 mg | ORAL_TABLET | Freq: Two times a day (BID) | ORAL | Status: DC

## 2012-08-11 MED ORDER — RIVAROXABAN 20 MG PO TABS: 20.0000 mg | ORAL_TABLET | Freq: Every day | ORAL | Status: DC

## 2012-08-11 NOTE — Assessment & Plan Note (Signed)
Above goal Increase metoprolol He is instructed to follow his BP closely at home and alert me if it remains elevated.

## 2012-08-11 NOTE — Assessment & Plan Note (Signed)
Compliance with CPAP is encouraged He says he is using it nightly   

## 2012-08-11 NOTE — Progress Notes (Signed)
PCP:  Cain Saupe, MD  The patient presents today for electrophysiology followup.  He last presented in July with afib with RVR and was cardioverted.  He has done well since that time.  He is unaware of any subsequent symptoms of afib.  He reports compliance with medical therapy and also CPAP.  His exercise tolerance returned to baseline after his recent cardioversion.  He feels that his present afib with RVR is not representative of his rhythm at home and feels that his heart rate is mostly 70s.  Today, he denies symptoms of chest pain, orthopnea, PND, lower extremity edema, dizziness, presyncope, syncope, or neurologic sequela.  The patient is otherwise without complaint today.   Past Medical History  Diagnosis Date  . Hypertension   . Paroxysmal atrial fibrillation   . Obstructive sleep apnea     severe  . Obesity   . ANEMIA   . BRADYCARDIA    Past Surgical History  Procedure Date  . Bilateral knee arthroscopy   . Appendectomy   . L eye cataract   . Tee without cardioversion 04/29/2012    Procedure: TRANSESOPHAGEAL ECHOCARDIOGRAM (TEE);  Surgeon: Vesta Mixer, MD;  Location: [facility] ENDOSCOPY;  Service: Cardiovascular;  Laterality: [name];  changed from nish to nahser/dl  . Cardioversion 04/29/2012    Procedure: CARDIOVERSION;  Surgeon: Vesta Mixer, MD;  Location: [facility] ENDOSCOPY;  Service: Cardiovascular;  Laterality: [name];    Current Outpatient Prescriptions  Medication Sig Dispense Refill  . amLODipine-benazepril (LOTREL) 5-10 MG per capsule Take 1 capsule by mouth daily.      . metoprolol tartrate (LOPRESSOR) 50 MG tablet Take 0.5 tablets (25 mg total) by mouth 2 (two) times daily.  180 tablet  2  . DISCONTD: metoprolol tartrate (LOPRESSOR) 25 MG tablet Take 1 tablet (25 mg total) by mouth 2 (two) times daily.  180 tablet  2  . Rivaroxaban (XARELTO) 20 MG TABS Take 1 tablet (20 mg total) by mouth daily.  30 tablet  3    No Known Allergies  History   Social History  . Marital  Status: Single    Spouse Name: [name]    Number of Children: [name]  . Years of Education: [name]   Occupational History  . Not on file.   Social History Main Topics  . Smoking status: Never Smoker   . Smokeless tobacco: Not on file  . Alcohol Use: No  . Drug Use: No  . Sexually Active: Not on file   Other Topics Concern  . Not on file   Social History Narrative   Lives with mother in [place].  Retired/ disabled from [employer].   ROS-  All systems are reviewed and are negative except as outlined in the HPI above   Physical Exam: Filed Vitals:   08/11/12 1505  BP: 146/92  Pulse: 137  Height: 6\' 3"  (1.905 m)  Weight: 291 lb (131.997 kg)    GEN- The patient is tired appearing, alert and oriented x 3 today.   Head- normocephalic, atraumatic Eyes-  Sclera clear, conjunctiva pink Ears- hearing intact Oropharynx- clear Neck- supple,  Lungs- Clear to ausculation bilaterally, normal work of breathing Heart- tachy irregular rhythm, no murmurs, rubs or gallops, PMI not laterally displaced GI- soft, NT, ND, + BS Extremities- no clubbing, cyanosis, trace edema MS- no significant deformity or atrophy Skin- no rash or lesion Psych- euthymic mood, full affect Neuro- strength and sensation are intact  ekg today afib, V rates 130 bpm  Assessment and  Plan:

## 2012-08-11 NOTE — Progress Notes (Signed)
This encounter was created in error - please disregard.

## 2012-08-11 NOTE — Assessment & Plan Note (Signed)
He has returned to afib since his recent cardioversion but is presently asymptomatic V rates are elevated. At this point, I will increase metoprolol to 50mg  BID.  I will stop ASA and restart xarelto. He will return in 1 wk for repeat ekg and follow-up. If he develops symptoms or if V rates remain elevated then we will need to initiate an antiarrhythmic medicine at that time.  Our best medical options would probably be tikosyn.  Multaq could also be considered.  I would be reluctant to use flecainide with his previously documented severe LVH.

## 2012-08-11 NOTE — Patient Instructions (Signed)
Your physician recommends that you schedule a follow-up appointment in: 1 week with Dr Johney Frame  Your physician has recommended you make the following change in your medication:  1) Stop Aspirin 2) Start Xarelto 20mg  daily 3) Increase Metoprolol to 50mg  twice daily

## 2012-08-11 NOTE — Assessment & Plan Note (Signed)
He is actively working to lose weight

## 2012-08-19 ENCOUNTER — Ambulatory Visit (INDEPENDENT_AMBULATORY_CARE_PROVIDER_SITE_OTHER): Payer: Medicare Other | Admitting: Internal Medicine

## 2012-08-19 ENCOUNTER — Encounter: Payer: Self-pay | Admitting: Internal Medicine

## 2012-08-19 VITALS — BP 130/90 | HR 60 | Ht 75.6 in | Wt 282.0 lb

## 2012-08-19 DIAGNOSIS — I4891 Unspecified atrial fibrillation: Secondary | ICD-10-CM

## 2012-08-19 DIAGNOSIS — I10 Essential (primary) hypertension: Secondary | ICD-10-CM

## 2012-08-19 DIAGNOSIS — G4733 Obstructive sleep apnea (adult) (pediatric): Secondary | ICD-10-CM | POA: Diagnosis not present

## 2012-08-19 NOTE — Patient Instructions (Signed)
Your physician recommends that you schedule a follow-up appointment in 3 months with Dr Allred    

## 2012-08-22 NOTE — Assessment & Plan Note (Signed)
Compliance with CPAP is encouraged He says he is using it nightly

## 2012-08-22 NOTE — Progress Notes (Signed)
PCP:  Cain Saupe, MD  The patient presents today for electrophysiology followup. He remains asymptomatic.  He has begun xarelto without any bleeding issues.  Today, he denies symptoms of chest pain, orthopnea, PND, lower extremity edema, dizziness, presyncope, syncope, or neurologic sequela.  The patient is otherwise without complaint today.   Past Medical History  Diagnosis Date  . Hypertension   . Paroxysmal atrial fibrillation   . Obstructive sleep apnea     severe  . Obesity   . ANEMIA   . BRADYCARDIA    Past Surgical History  Procedure Date  . Bilateral knee arthroscopy   . Appendectomy   . L eye cataract   . Tee without cardioversion 04/29/2012    Procedure: TRANSESOPHAGEAL ECHOCARDIOGRAM (TEE);  Surgeon: Vesta Mixer, MD;  Location: [facility] ENDOSCOPY;  Service: Cardiovascular;  Laterality: [name];  changed from nish to nahser/dl  . Cardioversion 04/29/2012    Procedure: CARDIOVERSION;  Surgeon: Vesta Mixer, MD;  Location: [facility] ENDOSCOPY;  Service: Cardiovascular;  Laterality: [name];    Current Outpatient Prescriptions  Medication Sig Dispense Refill  . amLODipine-benazepril (LOTREL) 5-10 MG per capsule Take 1 capsule by mouth daily.      . metoprolol tartrate (LOPRESSOR) 50 MG tablet Take 0.5 tablets (25 mg total) by mouth 2 (two) times daily.  180 tablet  2  . Rivaroxaban (XARELTO) 20 MG TABS Take 1 tablet (20 mg total) by mouth daily.  30 tablet  3    No Known Allergies  History   Social History  . Marital Status: Single    Spouse Name: [name]    Number of Children: [name]  . Years of Education: [name]   Occupational History  . Not on file.   Social History Main Topics  . Smoking status: Never Smoker   . Smokeless tobacco: Not on file  . Alcohol Use: No  . Drug Use: No  . Sexually Active: Not on file   Other Topics Concern  . Not on file   Social History Narrative   Lives with mother in [place].  Retired/ disabled from [employer].   ROS-  All systems are reviewed  and are negative except as outlined in the HPI above   Physical Exam: Filed Vitals:   08/19/12 1436  BP: 130/90  Pulse: 60  Height: 6' 3.6" (1.92 m)  Weight: 282 lb (127.914 kg)  SpO2: 97%    GEN- The patient is tired appearing, alert and oriented x 3 today.   Head- normocephalic, atraumatic Eyes-  Sclera clear, conjunctiva pink Ears- hearing intact Oropharynx- clear Neck- supple,  Lungs- Clear to ausculation bilaterally, normal work of breathing Heart- regular rhythm, no murmurs, rubs or gallops, PMI not laterally displaced GI- soft, NT, ND, + BS Extremities- no clubbing, cyanosis, trace edema  ekg today sinus rhythm 59 bpm, PR , incomplete RBBB  Assessment and Plan:

## 2012-08-22 NOTE — Assessment & Plan Note (Signed)
He has returned to sinus rhythm Continue current metoprolol and xarelto  If he develops symptoms or if V rates remain elevated then we will need to initiate an antiarrhythmic medicine at that time.  Our best medical options would probably be tikosyn.  Multaq could also be considered.  I would be reluctant to use flecainide with his previously documented severe LVH.  He may ultimately require catheter ablation.

## 2012-08-22 NOTE — Assessment & Plan Note (Signed)
Improved  No changes today 

## 2012-10-12 ENCOUNTER — Telehealth: Payer: Self-pay | Admitting: Internal Medicine

## 2012-10-12 NOTE — Telephone Encounter (Signed)
Called patient back and lmom to return my call

## 2012-10-12 NOTE — Telephone Encounter (Signed)
New problem:   Patient did not want to disclose any information. Stating he would like to speak with the nurse .

## 2012-10-25 NOTE — Telephone Encounter (Signed)
I have left another message for patient to return my call if he still needs me.  I left the first message last week

## 2012-11-09 ENCOUNTER — Telehealth: Payer: Self-pay | Admitting: Internal Medicine

## 2012-11-09 NOTE — Telephone Encounter (Signed)
Pt rtn your call from a month ago

## 2012-11-09 NOTE — Telephone Encounter (Signed)
Called patient and he says he is going to miss one dose of his Xarelto today but will pick it up tomorrow at the pharmacy.  I explained to him that it is not good but try and get it tomorrow morning as early as possible

## 2012-11-24 ENCOUNTER — Encounter: Payer: Self-pay | Admitting: Internal Medicine

## 2012-11-24 ENCOUNTER — Ambulatory Visit (INDEPENDENT_AMBULATORY_CARE_PROVIDER_SITE_OTHER): Payer: Medicare Other | Admitting: Internal Medicine

## 2012-11-24 VITALS — BP 134/90 | HR 84 | Ht 75.0 in | Wt 280.0 lb

## 2012-11-24 DIAGNOSIS — I4891 Unspecified atrial fibrillation: Secondary | ICD-10-CM | POA: Diagnosis not present

## 2012-11-24 DIAGNOSIS — I10 Essential (primary) hypertension: Secondary | ICD-10-CM | POA: Diagnosis not present

## 2012-11-24 DIAGNOSIS — G4733 Obstructive sleep apnea (adult) (pediatric): Secondary | ICD-10-CM | POA: Diagnosis not present

## 2012-11-24 MED ORDER — METOPROLOL SUCCINATE ER 50 MG PO TB24: 50.0000 mg | ORAL_TABLET | Freq: Every day | ORAL | Status: DC

## 2012-11-24 NOTE — Assessment & Plan Note (Signed)
He is complaint with CPAP but notes that he is still snoring despite this. I will ask Dr Shelle Iron to re-evaluate his sleep apnea

## 2012-11-24 NOTE — Assessment & Plan Note (Signed)
Stable No change required today  

## 2012-11-24 NOTE — Patient Instructions (Addendum)
Your physician recommends that you schedule a follow-up appointment in: 3 months with Dr Johney Frame  Your physician has recommended you make the following change in your medication:  1) Change Metoprolol to Toprol XL 50mg  daily

## 2012-11-24 NOTE — Progress Notes (Signed)
PCP:  Maurice Saupe, MD  The patient presents today for electrophysiology followup. He remains asymptomatic.  He reports that he occasionally forgets to take his twice daily metoprolol.  He is unaware of afib.  Today, he denies symptoms of chest pain, orthopnea, PND, lower extremity edema, dizziness, presyncope, syncope, or neurologic sequela.  The patient is otherwise without complaint today.   Past Medical History  Diagnosis Date  . Paroxysmal atrial fibrillation   . Obesity   . ANEMIA   . BRADYCARDIA    Past Surgical History  Procedure Laterality Date  . Bilateral knee arthroscopy    . Appendectomy    . L eye cataract    . Tee without cardioversion  04/29/2012    Procedure: TRANSESOPHAGEAL ECHOCARDIOGRAM (TEE);  Surgeon: Vesta Mixer, MD;  Location: [facility] ENDOSCOPY;  Service: Cardiovascular;  Laterality: Maurice Golden;  changed from nish to nahser/dl  . Cardioversion  04/29/2012    Procedure: CARDIOVERSION;  Surgeon: Vesta Mixer, MD;  Location: [facility] ENDOSCOPY;  Service: Cardiovascular;  Laterality: Maurice Golden;    Current Outpatient Prescriptions  Medication Sig Dispense Refill  . amLODipine-benazepril (LOTREL) 5-10 MG per capsule Take 1 capsule by mouth daily.      . Rivaroxaban (XARELTO) 20 MG TABS Take 1 tablet (20 mg total) by mouth daily.  30 tablet  3  . metoprolol succinate (TOPROL-XL) 50 MG 24 hr tablet Take 1 tablet (50 mg total) by mouth daily. Take with or immediately following a meal.  90 tablet  3   No current facility-administered medications for this visit.    No Known Allergies  History   Social History  . Marital Status: Single    Spouse Name: Maurice Golden    Number of Children: Maurice Golden  . Years of Education: Maurice Golden   Occupational History  . Not on file.   Social History Main Topics  . Smoking status: Never Smoker   . Smokeless tobacco: Not on file  . Alcohol Use: No  . Drug Use: No  . Sexually Active: Not on file   Other Topics Concern  . Not on file   Social History Narrative    Lives with mother in [place].  Retired/ disabled from [employer].   ROS-  All systems are reviewed and are negative except as outlined in the HPI above   Physical Exam: Filed Vitals:   11/24/12 1307  BP: 134/90  Pulse: 84  Height: 6\' 3"  (1.905 m)  Weight: 280 lb (127.007 kg)    GEN- The patient is tired appearing, alert and oriented x 3 today.   Head- normocephalic, atraumatic Eyes-  Sclera clear, conjunctiva pink Ears- hearing intact Oropharynx- clear Neck- supple,  Lungs- Clear to ausculation bilaterally, normal work of breathing Heart- regular rhythm, no murmurs, rubs or gallops, PMI not laterally displaced GI- soft, NT, ND, + BS Extremities- no clubbing, cyanosis, trace edema  ekg today sinus rhythm 81 bpm, PR   Assessment and Plan:

## 2012-11-24 NOTE — Assessment & Plan Note (Signed)
Maintaining sinus rhythm Continue xarelto Switch metoprolol to toprol XL for compliance He can take metoprolol prn for tachypalpitations  If afib returns, consider AAD therapy.

## 2012-11-26 ENCOUNTER — Telehealth: Payer: Self-pay | Admitting: Pulmonary Disease

## 2012-11-26 NOTE — Telephone Encounter (Signed)
Maurice Golden, I received a note from Dr. Johney Frame where this pt's wife is c/o him snoring again.  The pt thinks he is doing fine.  Can you call and get an ov for him to see me sometime in the future.  Thanks.

## 2012-12-03 ENCOUNTER — Telehealth: Payer: Self-pay | Admitting: Internal Medicine

## 2012-12-03 MED ORDER — METOPROLOL SUCCINATE ER 50 MG PO TB24: 50.0000 mg | ORAL_TABLET | Freq: Every day | ORAL | Status: DC

## 2012-12-03 NOTE — Telephone Encounter (Signed)
New Problem    Pt did not disclose much information. Would like to speak to you when you get a moment.

## 2012-12-03 NOTE — Telephone Encounter (Signed)
Sent into [facility] because he has not received from mail order

## 2012-12-06 ENCOUNTER — Other Ambulatory Visit: Payer: Self-pay | Admitting: *Deleted

## 2012-12-06 MED ORDER — RIVAROXABAN 20 MG PO TABS: 20.0000 mg | ORAL_TABLET | Freq: Every day | ORAL | Status: DC

## 2013-01-04 DIAGNOSIS — Z23 Encounter for immunization: Secondary | ICD-10-CM | POA: Diagnosis not present

## 2013-01-04 DIAGNOSIS — J392 Other diseases of pharynx: Secondary | ICD-10-CM | POA: Diagnosis not present

## 2013-01-04 DIAGNOSIS — I4891 Unspecified atrial fibrillation: Secondary | ICD-10-CM | POA: Diagnosis not present

## 2013-01-04 DIAGNOSIS — Z79899 Other long term (current) drug therapy: Secondary | ICD-10-CM | POA: Diagnosis not present

## 2013-01-04 DIAGNOSIS — Z7901 Long term (current) use of anticoagulants: Secondary | ICD-10-CM | POA: Diagnosis not present

## 2013-01-04 DIAGNOSIS — I10 Essential (primary) hypertension: Secondary | ICD-10-CM | POA: Diagnosis not present

## 2013-01-04 DIAGNOSIS — G4733 Obstructive sleep apnea (adult) (pediatric): Secondary | ICD-10-CM | POA: Diagnosis not present

## 2013-01-04 DIAGNOSIS — R7301 Impaired fasting glucose: Secondary | ICD-10-CM | POA: Diagnosis not present

## 2013-01-14 DIAGNOSIS — I10 Essential (primary) hypertension: Secondary | ICD-10-CM | POA: Diagnosis not present

## 2013-01-14 DIAGNOSIS — N341 Nonspecific urethritis: Secondary | ICD-10-CM | POA: Diagnosis not present

## 2013-01-14 DIAGNOSIS — Z202 Contact with and (suspected) exposure to infections with a predominantly sexual mode of transmission: Secondary | ICD-10-CM | POA: Diagnosis not present

## 2013-02-02 ENCOUNTER — Telehealth: Payer: Self-pay | Admitting: Internal Medicine

## 2013-02-02 NOTE — Telephone Encounter (Signed)
Patient called needing samples of xarelto.  I have put them up front for him and he is going to schedule his May appointment when he picks up the medication

## 2013-02-02 NOTE — Telephone Encounter (Signed)
New problem    Patient calling stating he need to speak with nurse. No other information was given.

## 2013-02-28 ENCOUNTER — Telehealth: Payer: Self-pay | Admitting: Internal Medicine

## 2013-02-28 NOTE — Telephone Encounter (Signed)
New problem    Not urgent pt wants you to give him a call

## 2013-02-28 NOTE — Telephone Encounter (Signed)
Samples given.  

## 2013-03-02 ENCOUNTER — Ambulatory Visit (INDEPENDENT_AMBULATORY_CARE_PROVIDER_SITE_OTHER): Payer: Medicare Other | Admitting: Internal Medicine

## 2013-03-02 ENCOUNTER — Encounter: Payer: Self-pay | Admitting: Internal Medicine

## 2013-03-02 VITALS — BP 162/96 | HR 81 | Ht 75.0 in | Wt 294.0 lb

## 2013-03-02 DIAGNOSIS — I4891 Unspecified atrial fibrillation: Secondary | ICD-10-CM

## 2013-03-02 DIAGNOSIS — I10 Essential (primary) hypertension: Secondary | ICD-10-CM

## 2013-03-02 NOTE — Patient Instructions (Addendum)
Your physician recommends that you schedule a follow-up appointment in 3 months with Dr Allred    

## 2013-03-08 NOTE — Progress Notes (Signed)
PCP:  Cain Saupe, MD  The patient presents today for electrophysiology followup.He feels that his afib is presently well controlled.  Today, he denies symptoms of chest pain, orthopnea, PND, lower extremity edema, dizziness, presyncope, syncope, or neurologic sequela.  The patient is otherwise without complaint today.   Past Medical History  Diagnosis Date  . Paroxysmal atrial fibrillation   . Obesity   . ANEMIA   . BRADYCARDIA    Past Surgical History  Procedure Laterality Date  . Bilateral knee arthroscopy    . Appendectomy    . L eye cataract    . Tee without cardioversion  04/29/2012    Procedure: TRANSESOPHAGEAL ECHOCARDIOGRAM (TEE);  Surgeon: Vesta Mixer, MD;  Location: [facility] ENDOSCOPY;  Service: Cardiovascular;  Laterality: [name];  changed from nish to nahser/dl  . Cardioversion  04/29/2012    Procedure: CARDIOVERSION;  Surgeon: Vesta Mixer, MD;  Location: [facility] ENDOSCOPY;  Service: Cardiovascular;  Laterality: [name];    Current Outpatient Prescriptions  Medication Sig Dispense Refill  . amLODipine-benazepril (LOTREL) 5-10 MG per capsule Take 1 capsule by mouth daily.      . metoprolol succinate (TOPROL-XL) 50 MG 24 hr tablet Take 1 tablet (50 mg total) by mouth daily. Take with or immediately following a meal.  90 tablet  3  . Rivaroxaban (XARELTO) 20 MG TABS Take 1 tablet (20 mg total) by mouth daily.  30 tablet  3   No current facility-administered medications for this visit.    No Known Allergies  History   Social History  . Marital Status: Single    Spouse Name: [name]    Number of Children: [name]  . Years of Education: [name]   Occupational History  . Not on file.   Social History Main Topics  . Smoking status: Never Smoker   . Smokeless tobacco: Not on file  . Alcohol Use: No  . Drug Use: No  . Sexually Active: Not on file   Other Topics Concern  . Not on file   Social History Narrative   Lives with mother in [place].  Retired/ disabled from [employer].    Physical Exam: Filed Vitals:   03/02/13 1627  BP: 162/96  Pulse: 81  Height: 6\' 3"  (1.905 m)  Weight: 294 lb (133.358 kg)    GEN- The patient is well appearing, alert and oriented x 3 today.   Head- normocephalic, atraumatic Eyes-  Sclera clear, conjunctiva pink Ears- hearing intact Oropharynx- clear Neck- supple,  Lungs- Clear to ausculation bilaterally, normal work of breathing Heart- regular rhythm, no murmurs, rubs or gallops, PMI not laterally displaced GI- soft, NT, ND, + BS Extremities- no clubbing, cyanosis, trace edema  ekg today sinus rhythm 76 bpm, PR   Assessment and Plan:  1. afib Stable No change required today  2. HTN Stable No change required today

## 2013-03-28 ENCOUNTER — Encounter: Payer: Self-pay | Admitting: Pulmonary Disease

## 2013-03-30 ENCOUNTER — Ambulatory Visit: Payer: Medicare Other | Admitting: Pulmonary Disease

## 2013-04-19 ENCOUNTER — Telehealth: Payer: Self-pay | Admitting: Internal Medicine

## 2013-04-19 NOTE — Telephone Encounter (Signed)
Pt called to get Xarelto 20 mg samples . Pt states has not received his check yet and is running out of his medication. A 20/ 20 mg sample tablets placed at the front desk, pt aware.

## 2013-04-19 NOTE — Telephone Encounter (Signed)
New Problem  Pt states he needs to speak with a nurse regarding his medications.

## 2013-04-26 ENCOUNTER — Ambulatory Visit (INDEPENDENT_AMBULATORY_CARE_PROVIDER_SITE_OTHER): Payer: Medicare Other | Admitting: Pulmonary Disease

## 2013-04-26 ENCOUNTER — Encounter: Payer: Self-pay | Admitting: Pulmonary Disease

## 2013-04-26 VITALS — BP 172/112 | HR 76 | Temp 98.5°F | Ht 75.0 in | Wt 296.6 lb

## 2013-04-26 DIAGNOSIS — G4733 Obstructive sleep apnea (adult) (pediatric): Secondary | ICD-10-CM | POA: Diagnosis not present

## 2013-04-26 NOTE — Patient Instructions (Addendum)
Continue on cpap Will [facility] to get you new supplies. Keep working on weight loss followup with me in one year.

## 2013-04-26 NOTE — Assessment & Plan Note (Signed)
The patient has a history of severe obstructive sleep apnea, but has responded very well to CPAP.  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  He has also lost over 25 pounds since the last visit, and I've asked him to continue working on this.  Overall, he is very satisfied with his response to therapy.  I would like to see him back in one year, or sooner if having problems.

## 2013-04-26 NOTE — Progress Notes (Signed)
  Subjective:    Patient ID: Maurice Golden, male    DOB: 11-23-1960, 52 y.o.   MRN: [id number]  HPI The patient comes in today for followup of his obstructive sleep apnea.  He has not been seen since 2011, but has done very well on the device.  He has seen significant improvement in his sleep and daytime alertness on the device.  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  The patient's weight is down over 25 pounds since the last visit.   Review of Systems  Constitutional: Negative for fever and unexpected weight change.  HENT: Positive for rhinorrhea. Negative for ear pain, nosebleeds, congestion, sore throat, sneezing, trouble swallowing, dental problem, postnasal drip and sinus pressure.   Eyes: Negative for redness and itching.  Respiratory: Positive for wheezing ( at times--quick spells). Negative for cough, chest tightness and shortness of breath.   Cardiovascular: Negative for palpitations and leg swelling.  Gastrointestinal: Negative for nausea and vomiting.  Genitourinary: Negative for dysuria.  Musculoskeletal: Negative for joint swelling.  Skin: Negative for rash.  Neurological: Negative for headaches.  Hematological: Does not bruise/bleed easily.  Psychiatric/Behavioral: Negative for dysphoric mood. The patient is not nervous/anxious.        Objective:   Physical Exam Overweight male in no acute distress Nose with purulent discharge noted No skin breakdown or pressure necrosis from the CPAP mask Neck without lymphadenopathy or thyromegaly Lower extremities with minimal edema, no cyanosis Alert and oriented, does not appear to be overly sleepy, moves all 4 extremities.       Assessment & Plan:

## 2013-05-09 DIAGNOSIS — M25569 Pain in unspecified knee: Secondary | ICD-10-CM | POA: Diagnosis not present

## 2013-05-09 DIAGNOSIS — M1710 Unilateral primary osteoarthritis, unspecified knee: Secondary | ICD-10-CM | POA: Diagnosis not present

## 2013-05-10 ENCOUNTER — Telehealth: Payer: Self-pay | Admitting: Internal Medicine

## 2013-05-10 NOTE — Telephone Encounter (Signed)
Follow up ° ° °Pt returning your call °

## 2013-05-10 NOTE — Telephone Encounter (Signed)
lmom for patient to return my call 

## 2013-05-10 NOTE — Telephone Encounter (Signed)
Spoke with patient and he has run out of Xarelto samples.  He is in [place] and has a nurse that can get him 15mg  samples until he can get his.  I explained to him that the 15mg  is not the dose prescribed and would be better to take the 20mg  if possible but something in his system would be better than none He is going to be able to get his 20mg  tablets in 2days

## 2013-05-10 NOTE — Telephone Encounter (Signed)
New Prob ° °Pt has a question regarding his medication.   °

## 2013-05-27 ENCOUNTER — Other Ambulatory Visit: Payer: Self-pay

## 2013-06-09 ENCOUNTER — Ambulatory Visit: Payer: Medicare Other | Admitting: Internal Medicine

## 2013-06-21 DIAGNOSIS — H938X9 Other specified disorders of ear, unspecified ear: Secondary | ICD-10-CM | POA: Diagnosis not present

## 2013-06-22 ENCOUNTER — Encounter: Payer: Self-pay | Admitting: Internal Medicine

## 2013-06-23 ENCOUNTER — Other Ambulatory Visit: Payer: Self-pay | Admitting: Internal Medicine

## 2013-06-24 DIAGNOSIS — H938X9 Other specified disorders of ear, unspecified ear: Secondary | ICD-10-CM | POA: Diagnosis not present

## 2013-06-24 DIAGNOSIS — H60509 Unspecified acute noninfective otitis externa, unspecified ear: Secondary | ICD-10-CM | POA: Diagnosis not present

## 2013-06-28 DIAGNOSIS — H60339 Swimmer's ear, unspecified ear: Secondary | ICD-10-CM | POA: Diagnosis not present

## 2013-06-28 DIAGNOSIS — H6120 Impacted cerumen, unspecified ear: Secondary | ICD-10-CM | POA: Diagnosis not present

## 2013-07-05 DIAGNOSIS — H60399 Other infective otitis externa, unspecified ear: Secondary | ICD-10-CM | POA: Diagnosis not present

## 2013-07-05 DIAGNOSIS — H6690 Otitis media, unspecified, unspecified ear: Secondary | ICD-10-CM | POA: Diagnosis not present

## 2013-07-18 DIAGNOSIS — H6120 Impacted cerumen, unspecified ear: Secondary | ICD-10-CM | POA: Diagnosis not present

## 2013-07-18 DIAGNOSIS — H60399 Other infective otitis externa, unspecified ear: Secondary | ICD-10-CM | POA: Diagnosis not present

## 2013-07-18 DIAGNOSIS — H60509 Unspecified acute noninfective otitis externa, unspecified ear: Secondary | ICD-10-CM | POA: Diagnosis not present

## 2013-07-26 ENCOUNTER — Telehealth: Payer: Self-pay | Admitting: Internal Medicine

## 2013-07-26 NOTE — Telephone Encounter (Signed)
Spoke with patient and let him know samples out front

## 2013-07-26 NOTE — Telephone Encounter (Signed)
New message    Pt states "I need to speak with kelly about something" and hung up

## 2013-08-09 DIAGNOSIS — R5383 Other fatigue: Secondary | ICD-10-CM | POA: Diagnosis not present

## 2013-08-09 DIAGNOSIS — I10 Essential (primary) hypertension: Secondary | ICD-10-CM | POA: Diagnosis not present

## 2013-08-09 DIAGNOSIS — R5381 Other malaise: Secondary | ICD-10-CM | POA: Diagnosis not present

## 2013-08-09 DIAGNOSIS — R7301 Impaired fasting glucose: Secondary | ICD-10-CM | POA: Diagnosis not present

## 2013-08-10 DIAGNOSIS — R5381 Other malaise: Secondary | ICD-10-CM | POA: Diagnosis not present

## 2013-08-10 DIAGNOSIS — R5383 Other fatigue: Secondary | ICD-10-CM | POA: Diagnosis not present

## 2013-08-29 ENCOUNTER — Telehealth: Payer: Self-pay | Admitting: *Deleted

## 2013-08-29 NOTE — Telephone Encounter (Signed)
Patient called and requested xarelto samples. Patient aware samples will be left at front desk for pick up.

## 2013-09-14 ENCOUNTER — Encounter (HOSPITAL_COMMUNITY): Payer: Self-pay | Admitting: Emergency Medicine

## 2013-09-14 ENCOUNTER — Emergency Department (HOSPITAL_COMMUNITY): Payer: Medicare Other

## 2013-09-14 ENCOUNTER — Emergency Department (HOSPITAL_COMMUNITY)
Admission: EM | Admit: 2013-09-14 | Discharge: 2013-09-15 | Disposition: A | Discharge status: Home / Self Care | Payer: Medicare Other | Attending: Emergency Medicine | Admitting: Emergency Medicine

## 2013-09-14 DIAGNOSIS — Z862 Personal history of diseases of the blood and blood-forming organs and certain disorders involving the immune mechanism: Secondary | ICD-10-CM | POA: Insufficient documentation

## 2013-09-14 DIAGNOSIS — Z79899 Other long term (current) drug therapy: Secondary | ICD-10-CM | POA: Insufficient documentation

## 2013-09-14 DIAGNOSIS — E669 Obesity, unspecified: Secondary | ICD-10-CM | POA: Insufficient documentation

## 2013-09-14 DIAGNOSIS — I4891 Unspecified atrial fibrillation: Secondary | ICD-10-CM | POA: Diagnosis not present

## 2013-09-14 DIAGNOSIS — R079 Chest pain, unspecified: Secondary | ICD-10-CM | POA: Insufficient documentation

## 2013-09-14 DIAGNOSIS — R002 Palpitations: Secondary | ICD-10-CM | POA: Diagnosis not present

## 2013-09-14 DIAGNOSIS — Z8701 Personal history of pneumonia (recurrent): Secondary | ICD-10-CM | POA: Diagnosis not present

## 2013-09-14 LAB — CBC WITH DIFFERENTIAL/PLATELET
Basophils Absolute: 0 10*3/uL (ref 0.0–0.1)
Basophils Relative: 0 % (ref 0–1)
Eosinophils Absolute: 0.1 10*3/uL (ref 0.0–0.7)
Eosinophils Relative: 2 % (ref 0–5)
HCT: 41.1 % (ref 39.0–52.0)
Hemoglobin: 14.4 g/dL (ref 13.0–17.0)
Lymphocytes Relative: 39 % (ref 12–46)
Lymphs Abs: 2.8 10*3/uL (ref 0.7–4.0)
MCH: 29.3 pg (ref 26.0–34.0)
MCHC: 35 g/dL (ref 30.0–36.0)
MCV: 83.7 fL (ref 78.0–100.0)
Monocytes Absolute: 0.9 10*3/uL (ref 0.1–1.0)
Monocytes Relative: 13 % — ABNORMAL HIGH (ref 3–12)
Neutro Abs: 3.3 10*3/uL (ref 1.7–7.7)
Neutrophils Relative %: 46 % (ref 43–77)
Platelets: 323 10*3/uL (ref 150–400)
RBC: 4.91 MIL/uL (ref 4.22–5.81)
RDW: 13.7 % (ref 11.5–15.5)
WBC: 7.2 10*3/uL (ref 4.0–10.5)

## 2013-09-14 LAB — POCT I-STAT TROPONIN I
Troponin i, poc: 0.01 ng/mL (ref 0.00–0.08)
Troponin i, poc: 0.03 ng/mL (ref 0.00–0.08)

## 2013-09-14 LAB — POCT I-STAT, CHEM 8
BUN: 22 mg/dL (ref 6–23)
Calcium, Ion: 1.26 mmol/L — ABNORMAL HIGH (ref 1.12–1.23)
Chloride: 105 mEq/L (ref 96–112)
Creatinine, Ser: 1.3 mg/dL (ref 0.50–1.35)
Glucose, Bld: 115 mg/dL — ABNORMAL HIGH (ref 70–99)
HCT: 44 % (ref 39.0–52.0)
Hemoglobin: 15 g/dL (ref 13.0–17.0)
Potassium: 3.6 mEq/L (ref 3.5–5.1)
Sodium: 142 mEq/L (ref 135–145)
TCO2: 24 mmol/L (ref 0–100)

## 2013-09-14 MED ORDER — METOPROLOL TARTRATE 25 MG PO TABS
25.0000 mg | ORAL_TABLET | Freq: Once | ORAL | Status: AC
  Administered 2013-09-14: 25 mg via ORAL
  Filled 2013-09-14: qty 1

## 2013-09-14 NOTE — ED Provider Notes (Signed)
CSN: [id number]     Arrival date & time 09/14/13  1946 History   First MD Initiated Contact with Patient 09/14/13 2005     Chief Complaint  Patient presents with  . Chest Pain   (Consider location/radiation/quality/duration/timing/severity/associated sxs/prior Treatment) Patient is a 52 y.o. male presenting with chest pain. The history is provided by the patient.  Chest Pain Associated symptoms: palpitations   Associated symptoms: no abdominal pain, no back pain, no headache, no nausea, no numbness, no shortness of breath, not vomiting and no weakness    patient presents with left upper chest pain. It lasts around 15 minutes. No nausea or diaphoresis. 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 He states he was not agitated and time. Upon arrival he is out of the atrial fibrillation. He has a history of A. fib does not usually in A. fib. He has had previous cardioversions and is on metoprolol and Xarelto. He has seen Dr. Johney Frame. No fevers. No cough. No abdominal pain.  Past Medical History  Diagnosis Date  . Paroxysmal atrial fibrillation   . Obesity   . ANEMIA   . BRADYCARDIA    Past Surgical History  Procedure Laterality Date  . Bilateral knee arthroscopy    . Appendectomy    . L eye cataract    . Tee without cardioversion  04/29/2012    Procedure: TRANSESOPHAGEAL ECHOCARDIOGRAM (TEE);  Surgeon: Vesta Mixer, MD;  Location: [facility] ENDOSCOPY;  Service: Cardiovascular;  Laterality: [name];  changed from nish to nahser/dl  . Cardioversion  04/29/2012    Procedure: CARDIOVERSION;  Surgeon: Vesta Mixer, MD;  Location: [facility] ENDOSCOPY;  Service: Cardiovascular;  Laterality: [name];   Family History  Problem Relation Age of Onset  . Diabetes Mother   . Hypertension Mother   . Hypertension Father    History  Substance Use Topics  . Smoking status: Never Smoker   . Smokeless tobacco: Not on file  . Alcohol Use: No    Review of Systems  Constitutional: Negative for activity change and  appetite change.  Eyes: Negative for pain.  Respiratory: Negative for chest tightness and shortness of breath.   Cardiovascular: Positive for chest pain and palpitations. Negative for leg swelling.  Gastrointestinal: Negative for nausea, vomiting, abdominal pain and diarrhea.  Genitourinary: Negative for flank pain.  Musculoskeletal: Negative for back pain and neck stiffness.  Skin: Negative for rash.  Neurological: Negative for weakness, numbness and headaches.  Psychiatric/Behavioral: Negative for behavioral problems.    Allergies  Review of patient's allergies indicates no known allergies.  Home Medications   Current Outpatient Rx  Name  Route  Sig  Dispense  Refill  . amLODipine-benazepril (LOTREL) 5-10 MG per capsule   Oral   Take 1 capsule by mouth daily.         . metoprolol succinate (TOPROL-XL) 50 MG 24 hr tablet   Oral   Take 1 tablet (50 mg total) by mouth daily. Take with or immediately following a meal.   90 tablet   3   . Multiple Vitamins-Minerals (MULTIVITAMIN WITH MINERALS) tablet   Oral   Take 1 tablet by mouth daily.         Carlena Hurl 20 MG TABS tablet      TAKE 1 TABLET (20 MG TOTAL) BY MOUTH DAILY.   30 tablet   4     .Marland KitchenPatient needs to contact office to schedule a fo ...   . metoprolol succinate (TOPROL-XL) 25 MG 24 hr tablet  Oral   Take 1 tablet (25 mg total) by mouth daily. Take with 50 mg tablets for a total of 75 mg a day   30 tablet   0    BP 119/76  Pulse 94  Temp(Src) 98.1 F (36.7 C) (Oral)  Resp 20  SpO2 96% Physical Exam  Nursing note and vitals reviewed. Constitutional: He is oriented to person, place, and time. He appears well-developed and well-nourished.  HENT:  Head: Normocephalic and atraumatic.  Eyes: EOM are normal. Pupils are equal, round, and reactive to light.  Neck: Normal range of motion. Neck supple.  Cardiovascular: Regular rhythm and normal heart sounds.   No murmur heard. Irregular rhythm   Pulmonary/Chest: Effort normal and breath sounds normal.  Abdominal: Soft. Bowel sounds are normal. He exhibits no distension and no mass. There is no tenderness. There is no rebound and no guarding.  Musculoskeletal: Normal range of motion. He exhibits no edema.  Neurological: He is alert and oriented to person, place, and time. No cranial nerve deficit.  Skin: Skin is warm and dry.  Psychiatric: He has a normal mood and affect.    ED Course  Procedures (including critical care time) Labs Review Labs Reviewed  CBC WITH DIFFERENTIAL - Abnormal; Notable for the following:    Monocytes Relative 13 (*)    All other components within normal limits  POCT I-STAT, CHEM 8 - Abnormal; Notable for the following:    Glucose, Bld 115 (*)    Calcium, Ion 1.26 (*)    All other components within normal limits  POCT I-STAT TROPONIN I  POCT I-STAT TROPONIN I   Imaging Review Dg Chest 2 View  09/14/2013   CLINICAL DATA:  Chest pain and palpitations.  EXAM: CHEST  2 VIEW  COMPARISON:  08/07/2011  FINDINGS: Lungs are adequately inflated without consolidation or effusion. The cardiomediastinal silhouette and remainder of the exam is unchanged.  IMPRESSION: No active cardiopulmonary disease.   Electronically Signed   By: Elberta Fortis M.D.   On: 09/14/2013 21:26    EKG Interpretation    Date/Time:  Wednesday September 14 2013 19:49:11 EST Ventricular Rate:  107 PR Interval:    QRS Duration: 92 QT Interval:  356 QTC Calculation: 475 R Axis:   -141 Text Interpretation:  Atrial fibrillation Markedly posterior QRS axis No significant change since last tracing Confirmed by Jayden Kratochvil  MD, Taraji Mungo ([id number]) on 09/14/2013 11:53:43 PM            MDM   1. Atrial fibrillation   2. Chest pain    Patient with atrial fibrillation. History of same but not normally in A. fib. He is anticoagulated and is on metoprolol. Chest pain doubt cardiac cause. Rate is mildly elevated but not a rapid ventricular rate.  I discussed with cardiology. Will increase metoprolol and patient will followup with Dr. Johney Frame.    Juliet Rude. Rubin Payor, MD 09/15/13 0020

## 2013-09-14 NOTE — ED Notes (Addendum)
Wrong pt

## 2013-09-14 NOTE — ED Notes (Signed)
Pt reports to the ED for eval of upper left sided CP. Pt was 94% on RA and was placed on 2 L and his O2 was 96%. Pt has hx of atrial fibrillation and 12 lead reveals this. Pts HR ranges from 90s-120s. Pt does take Xeralto. 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 Pain is non-radiating. Pt describes it as discomfort. Denies any associated symptoms of N/V, SOB, dizziness, lightheadedness, ect. Pt was slightly diaphoretic on scene. Pt received 1 nitro and 324 of ASA. Pt reports deep inspiration increases the pain. Pt reports his CP went from 6/10 to 5/10. Pt A&O x4, resp e/u, and skin warm and dry at this time.

## 2013-09-15 MED ORDER — METOPROLOL SUCCINATE ER 25 MG PO TB24: 25.0000 mg | ORAL_TABLET | Freq: Every day | ORAL | Status: DC

## 2013-09-19 ENCOUNTER — Encounter: Payer: Self-pay | Admitting: Internal Medicine

## 2013-09-19 ENCOUNTER — Ambulatory Visit (INDEPENDENT_AMBULATORY_CARE_PROVIDER_SITE_OTHER): Payer: Medicare Other | Admitting: Internal Medicine

## 2013-09-19 VITALS — BP 158/90 | HR 73 | Ht 75.0 in | Wt 297.4 lb

## 2013-09-19 DIAGNOSIS — I4891 Unspecified atrial fibrillation: Secondary | ICD-10-CM

## 2013-09-19 DIAGNOSIS — I10 Essential (primary) hypertension: Secondary | ICD-10-CM | POA: Diagnosis not present

## 2013-09-19 NOTE — Progress Notes (Signed)
PCP:  Cain Saupe, MD  The patient presents today for electrophysiology followup.He feels that his afib is presently well controlled.  He did have an episode of afib with RVR last week for which he presented to [facility] ER.  His AF spontaneously terminated.  His metoprolol was increased.  He has done well since that time.  Today, he denies symptoms of chest pain, orthopnea, PND, lower extremity edema, dizziness, presyncope, syncope, or neurologic sequela.  The patient is otherwise without complaint today.   Past Medical History  Diagnosis Date  . Paroxysmal atrial fibrillation   . Obesity   . ANEMIA   . BRADYCARDIA    Past Surgical History  Procedure Laterality Date  . Bilateral knee arthroscopy    . Appendectomy    . L eye cataract    . Tee without cardioversion  04/29/2012    Procedure: TRANSESOPHAGEAL ECHOCARDIOGRAM (TEE);  Surgeon: Vesta Mixer, MD;  Location: [facility] ENDOSCOPY;  Service: Cardiovascular;  Laterality: [name];  changed from nish to nahser/dl  . Cardioversion  04/29/2012    Procedure: CARDIOVERSION;  Surgeon: Vesta Mixer, MD;  Location: [facility] ENDOSCOPY;  Service: Cardiovascular;  Laterality: [name];    Current Outpatient Prescriptions  Medication Sig Dispense Refill  . amLODipine (NORVASC) 5 MG tablet Take 1 tablet by mouth daily.      . metoprolol succinate (TOPROL-XL) 25 MG 24 hr tablet Take with 50 mg tablets for a total of 75 mg a day      . metoprolol succinate (TOPROL-XL) 50 MG 24 hr tablet Take with the 25 mg tablet for a total of 75 mg a day. Take with or immediately following a meal.      . Multiple Vitamins-Minerals (MULTIVITAMIN WITH MINERALS) tablet Take 1 tablet by mouth daily.      Carlena Hurl 20 MG TABS tablet TAKE 1 TABLET (20 MG TOTAL) BY MOUTH DAILY.  30 tablet  4   No current facility-administered medications for this visit.    No Known Allergies  History   Social History  . Marital Status: Single    Spouse Name: [name]    Number of Children: [name]  .  Years of Education: [name]   Occupational History  . retired    Social History Main Topics  . Smoking status: Never Smoker   . Smokeless tobacco: Not on file  . Alcohol Use: No  . Drug Use: No  . Sexual Activity: Not on file   Other Topics Concern  . Not on file   Social History Narrative   Lives with mother in [place].  Retired/ disabled from [employer].   Physical Exam: Filed Vitals:   09/19/13 1520  BP: 158/90  Pulse: 73  Height: 6\' 3"  (1.905 m)  Weight: 297 lb 6.4 oz (134.9 kg)    GEN- The patient is well appearing, alert and oriented x 3 today.   Head- normocephalic, atraumatic Eyes-  Sclera clear, conjunctiva pink Ears- hearing intact Oropharynx- clear Neck- supple,  Lungs- Clear to ausculation bilaterally, normal work of breathing Heart- regular rhythm, no murmurs, rubs or gallops, PMI not laterally displaced GI- soft, NT, ND, + BS Extremities- no clubbing, cyanosis, trace edema  ekg today sinus rhythm 73 bpm, PR , incomplete RBBB, lAD  Assessment and Plan:  1. afib Stable No change required today  2. HTN Above goal Increase norvasc to 10mg  daily  Return in 3 months

## 2013-09-19 NOTE — Patient Instructions (Signed)
Your physician recommends that you schedule a follow-up appointment in: 3 months with Dr Johney Frame  Your physician has recommended you make the following change in your medication:  1) Increase Amlodipine to 10mg  daily

## 2013-09-23 ENCOUNTER — Other Ambulatory Visit: Payer: Self-pay | Admitting: Internal Medicine

## 2013-09-23 NOTE — Telephone Encounter (Signed)
New Message  Pt calling- requests clarity on dosage of metoprolol/// please call

## 2013-09-23 NOTE — Telephone Encounter (Signed)
Patient takes 1 50mg  daily and 1 25mg  daily for aa total of 75mg   He is aware and understands

## 2013-09-27 ENCOUNTER — Emergency Department (HOSPITAL_COMMUNITY): Payer: Medicare Other

## 2013-09-27 ENCOUNTER — Encounter (HOSPITAL_COMMUNITY): Payer: Self-pay | Admitting: Emergency Medicine

## 2013-09-27 ENCOUNTER — Emergency Department (HOSPITAL_COMMUNITY)
Admission: EM | Admit: 2013-09-27 | Discharge: 2013-09-28 | Disposition: A | Discharge status: Home / Self Care | Payer: Medicare Other | Attending: Emergency Medicine | Admitting: Emergency Medicine

## 2013-09-27 DIAGNOSIS — R0789 Other chest pain: Secondary | ICD-10-CM

## 2013-09-27 DIAGNOSIS — R109 Unspecified abdominal pain: Secondary | ICD-10-CM | POA: Diagnosis not present

## 2013-09-27 DIAGNOSIS — I4891 Unspecified atrial fibrillation: Secondary | ICD-10-CM | POA: Diagnosis not present

## 2013-09-27 DIAGNOSIS — Z7901 Long term (current) use of anticoagulants: Secondary | ICD-10-CM | POA: Diagnosis not present

## 2013-09-27 DIAGNOSIS — D649 Anemia, unspecified: Secondary | ICD-10-CM | POA: Insufficient documentation

## 2013-09-27 DIAGNOSIS — I119 Hypertensive heart disease without heart failure: Secondary | ICD-10-CM | POA: Diagnosis not present

## 2013-09-27 DIAGNOSIS — R6889 Other general symptoms and signs: Secondary | ICD-10-CM | POA: Diagnosis not present

## 2013-09-27 DIAGNOSIS — Z79899 Other long term (current) drug therapy: Secondary | ICD-10-CM | POA: Insufficient documentation

## 2013-09-27 DIAGNOSIS — R071 Chest pain on breathing: Secondary | ICD-10-CM | POA: Diagnosis not present

## 2013-09-27 DIAGNOSIS — E669 Obesity, unspecified: Secondary | ICD-10-CM | POA: Insufficient documentation

## 2013-09-27 LAB — POCT I-STAT TROPONIN I: Troponin i, poc: 0 ng/mL (ref 0.00–0.08)

## 2013-09-27 LAB — PROTIME-INR
INR: 1.21 (ref 0.00–1.49)
Prothrombin Time: 15 seconds (ref 11.6–15.2)

## 2013-09-27 LAB — CBC
HCT: 40.6 % (ref 39.0–52.0)
Hemoglobin: 14.1 g/dL (ref 13.0–17.0)
MCH: 29 pg (ref 26.0–34.0)
MCHC: 34.7 g/dL (ref 30.0–36.0)
MCV: 83.4 fL (ref 78.0–100.0)
Platelets: 303 10*3/uL (ref 150–400)
RBC: 4.87 MIL/uL (ref 4.22–5.81)
RDW: 13.6 % (ref 11.5–15.5)
WBC: 7.8 10*3/uL (ref 4.0–10.5)

## 2013-09-27 LAB — COMPREHENSIVE METABOLIC PANEL
ALT: 13 U/L (ref 0–53)
AST: 16 U/L (ref 0–37)
Albumin: 3.5 g/dL (ref 3.5–5.2)
Alkaline Phosphatase: 70 U/L (ref 39–117)
BUN: 15 mg/dL (ref 6–23)
CO2: 26 mEq/L (ref 19–32)
Calcium: 9 mg/dL (ref 8.4–10.5)
Chloride: 103 mEq/L (ref 96–112)
Creatinine, Ser: 0.87 mg/dL (ref 0.50–1.35)
GFR calc Af Amer: >90 mL/min (ref >90)
GFR calc non Af Amer: >90 mL/min (ref >90)
Glucose, Bld: 115 mg/dL — ABNORMAL HIGH (ref 70–99)
Potassium: 3.7 mEq/L (ref 3.5–5.1)
Sodium: 139 mEq/L (ref 135–145)
Total Bilirubin: 0.5 mg/dL (ref 0.3–1.2)
Total Protein: 7.8 g/dL (ref 6.0–8.3)

## 2013-09-27 LAB — APTT: aPTT: 31 seconds (ref 24–37)

## 2013-09-27 NOTE — ED Provider Notes (Signed)
CSN: [id number]     Arrival date & time 09/27/13  2255 History   First MD Initiated Contact with Patient 09/27/13 2307     Chief Complaint  Patient presents with  . Chest Pain   (Consider location/radiation/quality/duration/timing/severity/associated sxs/prior Treatment) HPI Patient presents with left parasternal chest pain that started earlier this evening while preparing dinner. The pain is sharp. He has no palpitations or shortness of breath. Patient has similar episode earlier in the month with a negative workup. Patient denies any lower sugary swelling or pain. He's had no recent extended travel or surgeries. Patient has no significant family history for coronary artery disease. He does have history of proximal atrial fibrillation for which is he is anticoagulated with Xeralto.  Past Medical History  Diagnosis Date  . Paroxysmal atrial fibrillation   . Obesity   . ANEMIA   . BRADYCARDIA    Past Surgical History  Procedure Laterality Date  . Bilateral knee arthroscopy    . Appendectomy    . L eye cataract    . Tee without cardioversion  04/29/2012    Procedure: TRANSESOPHAGEAL ECHOCARDIOGRAM (TEE);  Surgeon: Vesta Mixer, MD;  Location: [facility] ENDOSCOPY;  Service: Cardiovascular;  Laterality: [name];  changed from nish to nahser/dl  . Cardioversion  04/29/2012    Procedure: CARDIOVERSION;  Surgeon: Vesta Mixer, MD;  Location: [facility] ENDOSCOPY;  Service: Cardiovascular;  Laterality: [name];   Family History  Problem Relation Age of Onset  . Diabetes Mother   . Hypertension Mother   . Hypertension Father    History  Substance Use Topics  . Smoking status: Never Smoker   . Smokeless tobacco: Not on file  . Alcohol Use: No    Review of Systems  Constitutional: Negative for fever and chills.  Respiratory: Negative for shortness of breath.   Cardiovascular: Positive for chest pain. Negative for palpitations and leg swelling.  Gastrointestinal: Negative for nausea, vomiting,  abdominal pain and diarrhea.  Genitourinary: Negative for dysuria, frequency and flank pain.  Musculoskeletal: Negative for back pain, myalgias, neck pain and neck stiffness.  Skin: Negative for rash.  Neurological: Negative for dizziness, weakness, light-headedness, numbness and headaches.  All other systems reviewed and are negative.    Allergies  Review of patient's allergies indicates no known allergies.  Home Medications   Current Outpatient Rx  Name  Route  Sig  Dispense  Refill  . amLODipine (NORVASC) 5 MG tablet   Oral   Take 1 tablet by mouth daily.         . metoprolol succinate (TOPROL-XL) 25 MG 24 hr tablet   Oral   Take 25 mg by mouth daily. Take with the 50 mg tablets for a total of 75 mg a day         . metoprolol succinate (TOPROL-XL) 50 MG 24 hr tablet   Oral   Take 50 mg by mouth daily. Take with the 25 mg tablet for a total of 75 mg a day. Take with or immediately following a meal.         . Multiple Vitamins-Minerals (MULTIVITAMIN WITH MINERALS) tablet   Oral   Take 1 tablet by mouth daily.         . Rivaroxaban (XARELTO) 20 MG TABS tablet   Oral   Take 20 mg by mouth daily.          BP 117/91  Pulse 90  Temp(Src) 98.5 F (36.9 C) (Oral)  Resp 22  SpO2 97% Physical  Exam  Nursing note and vitals reviewed. Constitutional: He is oriented to person, place, and time. He appears well-developed and well-nourished. No distress.  HENT:  Head: Normocephalic and atraumatic.  Mouth/Throat: Oropharynx is clear and moist.  Eyes: EOM are normal. Pupils are equal, round, and reactive to light.  Neck: Normal range of motion. Neck supple.  Cardiovascular:  Irregularly irregular  Pulmonary/Chest: Effort normal and breath sounds normal. No respiratory distress. He has no wheezes. He has no rales. He exhibits tenderness (tenderness to palpation of the left parasternal border. Pain is completely reproduced with palpation.).  Abdominal: Soft. Bowel sounds  are normal. He exhibits no distension and no mass. There is no tenderness. There is no rebound and no guarding.  Musculoskeletal: Normal range of motion. He exhibits no edema and no tenderness.  No calf swelling or tenderness.  Neurological: He is alert and oriented to person, place, and time.  Patient is alert and oriented x3 with clear, goal oriented speech. Patient has 5/5 motor in all extremities. Sensation is intact to light touch.   Skin: Skin is warm and dry. No rash noted. No erythema.  Psychiatric: He has a normal mood and affect. His behavior is normal.    ED Course  Procedures (including critical care time) Labs Review Labs Reviewed  CBC  COMPREHENSIVE METABOLIC PANEL  APTT  PROTIME-INR  POCT I-STAT TROPONIN I   Imaging Review No results found.  EKG Interpretation    Date/Time:  Tuesday September 27 2013 23:01:27 EST Ventricular Rate:  95 PR Interval:    QRS Duration: 90 QT Interval:  369 QTC Calculation: 464 R Axis:   -77 Text Interpretation:  Atrial fibrillation Ventricular premature complex Inferior infarct, old Anterior infarct, old Confirmed by Rakim Moone  MD, Donneisha Beane ([id number]) on 09/28/2013 3:01:29 AM            MDM   Patient with reproducible chest pain with palpation over the chest wall. His EKG has no significant changes. He has troponin x2 that are negative. We'll treat with anti-inflammatory medications and have patient followup with his cardiologist. Return precautions have been given. Patient is voiced understanding and agrees with plan.   Loren Racer, MD 09/28/13 [phone]

## 2013-09-27 NOTE — ED Notes (Signed)
Patient transported to X-ray 

## 2013-09-27 NOTE — ED Notes (Signed)
Pt reports to the ED for eval of substernal CP that began while he was sitting down about to eat dinner. Pain does not radiate. Pt denies any associated symptoms such as SOB, N/V, diaphoresis, ect. Pt has hx of atrial fibrillation and is currently in A. Fib. His rate is controlled in the 60s-100s. Pt placed on Metoprolol and hasn't had problems with his rate since. Pt also take Xeralto. Pain is worse with palpation. No nitro or ASA given PTA. Pt A&O x4, resp e/u, and skin warm and dry.

## 2013-09-28 LAB — POCT I-STAT TROPONIN I: Troponin i, poc: 0.01 ng/mL (ref 0.00–0.08)

## 2013-09-28 MED ORDER — KETOROLAC TROMETHAMINE 30 MG/ML IJ SOLN
30.0000 mg | Freq: Once | INTRAMUSCULAR | Status: AC
  Administered 2013-09-28: 30 mg via INTRAVENOUS
  Filled 2013-09-28: qty 1

## 2013-09-28 MED ORDER — KETOROLAC TROMETHAMINE 60 MG/2ML IM SOLN: 60.0000 mg | Freq: Once | INTRAMUSCULAR | Status: DC

## 2013-09-28 MED ORDER — IBUPROFEN 600 MG PO TABS: 600.0000 mg | ORAL_TABLET | Freq: Three times a day (TID) | ORAL | Status: DC

## 2013-09-30 ENCOUNTER — Telehealth: Payer: Self-pay | Admitting: Internal Medicine

## 2013-09-30 NOTE — Telephone Encounter (Signed)
Spoke with patient.

## 2013-09-30 NOTE — Telephone Encounter (Signed)
New question     Pt called and would like you to call him back he has a question.  Pt would not elaborate.

## 2013-10-03 DIAGNOSIS — Z8679 Personal history of other diseases of the circulatory system: Secondary | ICD-10-CM | POA: Diagnosis not present

## 2013-10-03 NOTE — Telephone Encounter (Signed)
Spoke with patient and answered his questions.

## 2013-10-11 ENCOUNTER — Encounter: Payer: Self-pay | Admitting: Cardiology

## 2013-10-11 ENCOUNTER — Ambulatory Visit (INDEPENDENT_AMBULATORY_CARE_PROVIDER_SITE_OTHER): Payer: Medicare Other | Admitting: Cardiology

## 2013-10-11 VITALS — BP 116/80 | HR 132 | Ht 75.0 in | Wt 285.0 lb

## 2013-10-11 DIAGNOSIS — I4891 Unspecified atrial fibrillation: Secondary | ICD-10-CM

## 2013-10-11 MED ORDER — METOPROLOL SUCCINATE ER 100 MG PO TB24: 100.0000 mg | ORAL_TABLET | Freq: Every day | ORAL | Status: DC

## 2013-10-11 NOTE — Patient Instructions (Addendum)
Your physician recommends that you schedule a follow-up appointment in: 1 WEEK WITH BROOKE   Your physician has recommended you make the following change in your medication:   INCREASE TOPROL 100 MG ONCE A DAY  KEEP APPOINTMENT WITH DR. ALLRED 12-14-2013 AT 2:15PM  Your physician recommends that you continue on your current medications as directed. Please refer to the Current Medication list given to you today.

## 2013-10-11 NOTE — Progress Notes (Signed)
Patient ID: Maurice Golden MRN: [id number], DOB/AGE: 12/21/1960   Date of Visit: 10/11/2013  Primary Physician: Cain Saupe, MD Primary Cardiologist: Hillis Range, MD Reason for Visit: Atrial fibrillation  History of Present Illness  Maurice Golden is a 52 y.o. male with PAF, normal LV function and HTN who presents today for electrophysiology followup. He was seen last week by his PCP for URI and an ECG was done revealing AF. They scheduled a follow-up visit for him today.  Since last being seen in our clinic, he reports he is doing well and has no complaints. He worries a lot about his heart rhythm. However, he is asymptomatic. He denies CP, SOB, palpitations, dizziness, near syncope or syncope. He denies LE swelling, orthopnea or PND. He reports compliance with his medications including Xarelto; however, he has not taken metoprolol today as he usually takes this with lunch.  Past Medical History Past Medical History  Diagnosis Date  . Paroxysmal atrial fibrillation   . Obesity   . ANEMIA   . BRADYCARDIA   . OSA (obstructive sleep apnea)   . Bradycardia   . Atrial fibrillation with RVR     Past Surgical History Past Surgical History  Procedure Laterality Date  . Bilateral knee arthroscopy    . Appendectomy    . L eye cataract    . Tee without cardioversion  04/29/2012    Procedure: TRANSESOPHAGEAL ECHOCARDIOGRAM (TEE);  Surgeon: Vesta Mixer, MD;  Location: [facility] ENDOSCOPY;  Service: Cardiovascular;  Laterality: [name];  changed from nish to nahser/dl  . Cardioversion  04/29/2012    Procedure: CARDIOVERSION;  Surgeon: Vesta Mixer, MD;  Location: [facility] ENDOSCOPY;  Service: Cardiovascular;  Laterality: [name];    Allergies/Intolerances No Known Allergies  Current Home Medications Current Outpatient Prescriptions  Medication Sig Dispense Refill  . amLODipine (NORVASC) 5 MG tablet Take 1 tablet by mouth daily.      Marland Kitchen ibuprofen (ADVIL,MOTRIN) 200 MG tablet Take 200-600 mg by mouth every 6  (six) hours as needed for headache or moderate pain.      Marland Kitchen ibuprofen (ADVIL,MOTRIN) 600 MG tablet Take 1 tablet (600 mg total) by mouth 3 (three) times daily with meals.  30 tablet  0  . Rivaroxaban (XARELTO) 20 MG TABS tablet Take 20 mg by mouth at bedtime.       . metoprolol succinate (TOPROL-XL) 100 MG 24 hr tablet Take 1 tablet (100 mg total) by mouth daily. Take with or immediately following a meal.  90 tablet  3   No current facility-administered medications for this visit.    Social History History   Social History  . Marital Status: Single    Spouse Name: [name]    Number of Children: [name]  . Years of Education: [name]   Occupational History  . retired    Social History Main Topics  . Smoking status: Never Smoker   . Smokeless tobacco: Not on file  . Alcohol Use: No  . Drug Use: No  . Sexual Activity: Not on file   Other Topics Concern  . Not on file   Social History Narrative   Lives with mother in [place].  Retired/ disabled from [employer].     Review of Systems General: No chills, fever, night sweats or weight changes Cardiovascular: No chest pain, dyspnea on exertion, edema, orthopnea, palpitations, paroxysmal nocturnal dyspnea Dermatological: No rash, lesions or masses Respiratory: No cough, dyspnea Urologic: No hematuria, dysuria Abdominal: No nausea, vomiting, diarrhea, bright red blood per rectum,  melena, or hematemesis Neurologic: No visual changes, weakness, changes in mental status All other systems reviewed and are otherwise negative except as noted above.  Physical Exam Vitals: Blood pressure 116/80, pulse 132, height 6\' 3"  (1.905 m), weight 285 lb (129.275 kg).  General: Well developed, well appearing 52 y.o. male in no acute distress. HEENT: Normocephalic, atraumatic. EOMs intact. Sclera nonicteric. Oropharynx clear.  Neck: Supple. No JVD. Lungs: Respirations regular and unlabored, CTA bilaterally. No wheezes, rales or rhonchi. Heart: Irregular. S1,  S2 present. No murmurs, rub, S3 or S4. Abdomen: Soft, non-distended.  Extremities: No clubbing, cyanosis or edema. DP/PT/Radials 2+ and equal bilaterally. Psych: Normal affect. Neuro: Alert and oriented X 3. Moves all extremities spontaneously.   Diagnostics Treadmill Myoview May 2010 Overall Impression  Exercise Capacity: Dobutamine study with no exercise.  BP Response: Normal blood pressure response.  Clinical Symptoms: Chest tightness.  ECG Impression: No significant ST segment change suggestive of ischemia.  Overall Impression: Normal perfusion images with no evidence for ischemia or infarction. Mild global hypokinesis on gated images.  Echocardiogram August 2013  Study Conclusions - Left ventricle: The cavity size was normal. Wall thickness was increased in a pattern of severe LVH. Systolic function was normal. The estimated ejection fraction was in the range of 55% to 60%. Wall motion was normal; there were no regional wall motion abnormalities. Doppler parameters are consistent with abnormal left ventricular relaxation (grade 1 diastolic dysfunction). - Left atrium: The atrium was moderately dilated. - Right atrium: The atrium was mildly dilated. - Atrial septum: There was an atrial septal aneurysm. - Pulmonary arteries: PA peak pressure: 33mm Hg (S). 12-lead ECG from PCP's office 10/03/2013 - atrial fibrillation at 115 bpm 12-lead ECG today - atrial fibrillation at 132 bpm; QRS 90, QT/QTc 294/435  Assessment and Plan 1. Atrial fibrillation - asymptomatic - rate poorly controlled - not a candidate for flecainide due to severe LVH on echo - episode probably triggered by recent URI; however,if remains in AF with poorly controlled rates will need to consider DCCV +/- AAD therapy with Tikosyn or Sotalol - for now, will increase metoprolol succinate to 100 mg once daily - continue Xarelto for stroke prevention - return to clinic in one week   This plan of care was formulated  with Dr. Johney Frame via phone. Signed, Rick Duff, PA-C 10/11/2013, 10:27 AM

## 2013-10-12 ENCOUNTER — Other Ambulatory Visit: Payer: Self-pay | Admitting: Internal Medicine

## 2013-10-19 ENCOUNTER — Encounter: Payer: Self-pay | Admitting: Cardiology

## 2013-10-19 ENCOUNTER — Ambulatory Visit (INDEPENDENT_AMBULATORY_CARE_PROVIDER_SITE_OTHER): Payer: Medicare Other | Admitting: Cardiology

## 2013-10-19 ENCOUNTER — Telehealth: Payer: Self-pay | Admitting: Internal Medicine

## 2013-10-19 VITALS — BP 120/84 | HR 67 | Ht 75.0 in | Wt 268.4 lb

## 2013-10-19 DIAGNOSIS — I4891 Unspecified atrial fibrillation: Secondary | ICD-10-CM

## 2013-10-19 NOTE — Telephone Encounter (Signed)
Patient canceled appointment and is going to reschedule due to having diarrhea

## 2013-10-19 NOTE — Patient Instructions (Signed)
KEEP APPOINTMENT WITH DR. ALLRED  Your physician recommends that you continue on your current medications as directed. Please refer to the Current Medication list given to you today.

## 2013-10-19 NOTE — Telephone Encounter (Signed)
New message     Pt states he feels ok but not ok. Wants to know if he can be seen earlier than this afternoon and requests not to see brooke.

## 2013-10-19 NOTE — Progress Notes (Signed)
Patient ID: Maurice Golden MRN: [id number], DOB/AGE: 04-15-1961   Date of Visit: 10/19/2013  Primary Physician: Cain Saupe, MD Primary Cardiologist: Hillis Range, MD  Reason for Visit: Atrial fibrillation   History of Present Illness  Thunder Rigoni is a 53 y.o. male with PAF, normal LV function and HTN who presents today for electrophysiology followup. He was seen last week, referred by his PCP for recurrent AFib. This was in the setting of URI. He was asymptomatic. His rate was not well controlled; therefore, his BB was up-titrated. He presents today for one week follow-up. He reports he is doing well and has no complaints. He feels better with no upper respiratory symptoms or recurrent fever. He denies CP, SOB, palpitations, dizziness, near syncope or syncope. He denies LE swelling, orthopnea or PND. He reports compliance with his medications including Xarelto.  Past Medical History Past Medical History  Diagnosis Date  . Paroxysmal atrial fibrillation   . Obesity   . ANEMIA   . BRADYCARDIA   . OSA (obstructive sleep apnea)   . Bradycardia   . Atrial fibrillation with RVR     Past Surgical History Past Surgical History  Procedure Laterality Date  . Bilateral knee arthroscopy    . Appendectomy    . L eye cataract    . Tee without cardioversion  04/29/2012    Procedure: TRANSESOPHAGEAL ECHOCARDIOGRAM (TEE);  Surgeon: Vesta Mixer, MD;  Location: [facility] ENDOSCOPY;  Service: Cardiovascular;  Laterality: [name];  changed from nish to nahser/dl  . Cardioversion  04/29/2012    Procedure: CARDIOVERSION;  Surgeon: Vesta Mixer, MD;  Location: [facility] ENDOSCOPY;  Service: Cardiovascular;  Laterality: [name];    Allergies/Intolerances No Known Allergies  Current Home Medications Current Outpatient Prescriptions  Medication Sig Dispense Refill  . amLODipine (NORVASC) 5 MG tablet Take 1 tablet by mouth daily.      Marland Kitchen ibuprofen (ADVIL,MOTRIN) 200 MG tablet Take 200-600 mg by mouth every 6 (six) hours as  needed for headache or moderate pain.      . metoprolol succinate (TOPROL-XL) 100 MG 24 hr tablet Take 1 tablet (100 mg total) by mouth daily. Take with or immediately following a meal.  90 tablet  3  . XARELTO 20 MG TABS tablet TAKE 1 TABLET BY MOUTH EVERY DAY *MAKE FOLLOW UP APPT WITH MD FOR FUTURE REFILLS*  30 tablet  0   No current facility-administered medications for this visit.    Social History History   Social History  . Marital Status: Single    Spouse Name: [name]    Number of Children: [name]  . Years of Education: [name]   Occupational History  . retired    Social History Main Topics  . Smoking status: Never Smoker   . Smokeless tobacco: Not on file  . Alcohol Use: No  . Drug Use: No  . Sexual Activity: Not on file   Other Topics Concern  . Not on file   Social History Narrative   Lives with mother in [place].  Retired/ disabled from [employer].     Review of Systems General: No chills, fever, night sweats or weight changes Cardiovascular: No chest pain, dyspnea on exertion, edema, orthopnea, palpitations, paroxysmal nocturnal dyspnea Dermatological: No rash, lesions or masses Respiratory: No cough, dyspnea Urologic: No hematuria, dysuria Abdominal: No nausea, vomiting, diarrhea, bright red blood per rectum, melena, or hematemesis Neurologic: No visual changes, weakness, changes in mental status All other systems reviewed and are otherwise negative except as noted above.  Physical Exam Vitals: Blood pressure 120/84, pulse 67, height 6\' 3"  (1.905 m), weight 268 lb 6.4 oz (121.745 kg), SpO2 96.00%.  General: Well developed, well appearing 53 y.o. male in no acute distress. HEENT: Normocephalic, atraumatic. EOMs intact. Sclera nonicteric. Oropharynx clear.  Neck: Supple. No JVD. Lungs: Respirations regular and unlabored, CTA bilaterally. No wheezes, rales or rhonchi. Heart: RRR. S1, S2 present. No murmurs, rub, S3 or S4. Abdomen: Soft, non-distended.  Extremities:  No clubbing, cyanosis or edema.  Psych: Normal affect. Neuro: Alert and oriented X 3. Moves all extremities spontaneously.   Diagnostics Treadmill Myoview May 2010  Overall Impression  Exercise Capacity: Dobutamine study with no exercise.  BP Response: Normal blood pressure response.  Clinical Symptoms: Chest tightness.  ECG Impression: No significant ST segment change suggestive of ischemia.  Overall Impression: Normal perfusion images with no evidence for ischemia or infarction. Mild global hypokinesis on gated images.  Echocardiogram August 2013  Study Conclusions - Left ventricle: The cavity size was normal. Wall thickness was increased in a pattern of severe LVH. Systolic function was normal. The estimated ejection fraction was in the range of 55% to 60%. Wall motion was normal; there were no regional wall motion abnormalities. Doppler parameters are consistent with abnormal left ventricular relaxation (grade 1 diastolic dysfunction). - Left atrium: The atrium was moderately dilated. - Right atrium: The atrium was mildly dilated. - Atrial septum: There was an atrial septal aneurysm. - Pulmonary arteries: PA peak pressure: 33mm Hg (S).  12-lead ECG today - NSR at 57 bpm, incomplete RBBB; PR 228, QRS 110, QT/QTc 414/402  Assessment and Plan 1. Paroxysmal atrial fibrillation  - now back in NSR; recurrent episode most likely triggered by URI - continue rate control with metoprolol - continue Xarelto for stroke prevention  - return to clinic for follow-up with Dr. Johney FrameAllred as scheduled in March 2015  Signed, Rick DuffDMISTEN, Jermey Closs, PA-C 10/19/2013, 5:21 PM

## 2013-11-15 ENCOUNTER — Telehealth: Payer: Self-pay | Admitting: Internal Medicine

## 2013-11-15 NOTE — Telephone Encounter (Signed)
New problem  Patient would not give detail for call just said personal and to have Tresa Endo give him a call back.

## 2013-11-15 NOTE — Telephone Encounter (Signed)
Will place samples out front

## 2013-11-15 NOTE — Telephone Encounter (Signed)
Left message for patient to return my call.

## 2013-11-17 ENCOUNTER — Other Ambulatory Visit: Payer: Self-pay | Admitting: Internal Medicine

## 2013-11-17 MED ORDER — METOPROLOL SUCCINATE ER 100 MG PO TB24: 100.0000 mg | ORAL_TABLET | Freq: Every day | ORAL | Status: DC

## 2013-11-30 ENCOUNTER — Telehealth: Payer: Self-pay | Admitting: Internal Medicine

## 2013-11-30 NOTE — Telephone Encounter (Signed)
Called patient and lmom for him to call me back

## 2013-11-30 NOTE — Telephone Encounter (Signed)
New Problem:  Pt is requesting a call back from [place]. Pt states he will give more details when Gilman calls.

## 2013-12-01 MED ORDER — METOPROLOL SUCCINATE ER 50 MG PO TB24: 100.0000 mg | ORAL_TABLET | Freq: Every day | ORAL | Status: DC

## 2013-12-01 NOTE — Telephone Encounter (Signed)
Follow up    Pt needs a call back does not need any samples right now.  Please call him back has question about another med.

## 2013-12-01 NOTE — Telephone Encounter (Signed)
Spoke with patient.  He has a follow up in march and will discuss his concerns then with Dr Johney Frame.  He states he feels "washed out"  Thinks it's coming from the Metoprolol.  His HR has been in the 50's and he wants to cut the dose in 1/2 to 50mg  daily.  I let him know he could try this and see if this helps with his fatigue.  We should know when he returns for follow up if this made a difference.  He also needs some samples of Xarelto  [place] out front

## 2013-12-02 ENCOUNTER — Telehealth: Payer: Self-pay | Admitting: Internal Medicine

## 2013-12-02 MED ORDER — METOPROLOL SUCCINATE ER 50 MG PO TB24: 100.0000 mg | ORAL_TABLET | Freq: Every day | ORAL | Status: DC

## 2013-12-02 NOTE — Telephone Encounter (Signed)
New Prob   Requesting call back. Pt did not want to disclose information. Please call.

## 2013-12-14 ENCOUNTER — Encounter: Payer: Self-pay | Admitting: Internal Medicine

## 2013-12-14 ENCOUNTER — Ambulatory Visit (INDEPENDENT_AMBULATORY_CARE_PROVIDER_SITE_OTHER): Payer: Medicare Other | Admitting: Internal Medicine

## 2013-12-14 VITALS — BP 121/85 | HR 129 | Ht 75.0 in | Wt 290.0 lb

## 2013-12-14 DIAGNOSIS — I10 Essential (primary) hypertension: Secondary | ICD-10-CM | POA: Diagnosis not present

## 2013-12-14 DIAGNOSIS — G4733 Obstructive sleep apnea (adult) (pediatric): Secondary | ICD-10-CM | POA: Diagnosis not present

## 2013-12-14 DIAGNOSIS — I4891 Unspecified atrial fibrillation: Secondary | ICD-10-CM

## 2013-12-14 MED ORDER — DRONEDARONE HCL 400 MG PO TABS: 400.0000 mg | ORAL_TABLET | Freq: Two times a day (BID) | ORAL | Status: DC

## 2013-12-14 NOTE — Patient Instructions (Addendum)
Your physician recommends that you schedule a follow-up appointment in: one week for an EKG in nurse room visit if still out of rhythm will need a DCCV if in rhythm see Dr Johney Frame in 6 weeks  Your physician has recommended you make the following change in your medication:  1) Start Multaq 400mg  twice daily

## 2013-12-14 NOTE — Progress Notes (Signed)
PCP:  Cain Saupe, MD  The patient presents today for electrophysiology followup.  He is back in afib.  He reports fatigue with this.  He reports compliance with medicines and CPAP.  Today, he denies symptoms of chest pain, orthopnea, PND, lower extremity edema, dizziness, presyncope, syncope, or neurologic sequela.  The patient is otherwise without complaint today.   Past Medical History  Diagnosis Date  . Paroxysmal atrial fibrillation   . Obesity   . ANEMIA   . BRADYCARDIA   . OSA (obstructive sleep apnea)   . Bradycardia   . Atrial fibrillation with RVR    Past Surgical History  Procedure Laterality Date  . Bilateral knee arthroscopy    . Appendectomy    . L eye cataract    . Tee without cardioversion  04/29/2012    Procedure: TRANSESOPHAGEAL ECHOCARDIOGRAM (TEE);  Surgeon: Vesta Mixer, MD;  Location: [facility] ENDOSCOPY;  Service: Cardiovascular;  Laterality: [name];  changed from nish to nahser/dl  . Cardioversion  04/29/2012    Procedure: CARDIOVERSION;  Surgeon: Vesta Mixer, MD;  Location: [facility] ENDOSCOPY;  Service: Cardiovascular;  Laterality: [name];    Current Outpatient Prescriptions  Medication Sig Dispense Refill  . amLODipine (NORVASC) 5 MG tablet Take 1 tablet by mouth daily.      Marland Kitchen ibuprofen (ADVIL,MOTRIN) 200 MG tablet Take 200-600 mg by mouth every 6 (six) hours as needed for headache or moderate pain.      . metoprolol succinate (TOPROL-XL) 50 MG 24 hr tablet Take 2 tablets (100 mg total) by mouth daily. Take with or immediately following a meal.  180 tablet  3  . Rivaroxaban (XARELTO) 20 MG TABS tablet TAKE 1 TABLET BY MOUTH EVERY DAY      . dronedarone (MULTAQ) 400 MG tablet Take 1 tablet (400 mg total) by mouth 2 (two) times daily with a meal.  60 tablet  11   No current facility-administered medications for this visit.    No Known Allergies  History   Social History  . Marital Status: Single    Spouse Name: [name]    Number of Children: [name]  . Years of  Education: [name]   Occupational History  . retired    Social History Main Topics  . Smoking status: Never Smoker   . Smokeless tobacco: Not on file  . Alcohol Use: No  . Drug Use: No  . Sexual Activity: Not on file   Other Topics Concern  . Not on file   Social History Narrative   Lives with mother in [place].  Retired/ disabled from [employer].   Physical Exam: Filed Vitals:   12/14/13 1438  BP: 121/85  Pulse: 129  Height: 6\' 3"  (1.905 m)  Weight: 290 lb (131.543 kg)    GEN- The patient is well appearing, alert and oriented x 3 today.   Head- normocephalic, atraumatic Eyes-  Sclera clear, conjunctiva pink Ears- hearing intact Oropharynx- clear Neck- supple,  Lungs- Clear to ausculation bilaterally, normal work of breathing Heart- tachycardic irregular rhythm, no murmurs, rubs or gallops, PMI not laterally displaced GI- soft, NT, ND, + BS Extremities- no clubbing, cyanosis, no edema  Echo is reviewed ekg today reveals afib with V rate 129 bpm, incomplete RBBB, lAD  Assessment and Plan:  1. afib Back in afib Therapeutic strategies for afib including rate and rhythm control were discussed in detail with the patient today.  At this time, I will start multaq.  He will return in 1 week for ekg .  If in sinus, then I will see in 6 weeks.  If he remains in afib then he should be scheduled for cardioversion at that time.  2. HTN At goal Continue norvasc  3. LVH Stable No change required today   

## 2013-12-20 ENCOUNTER — Encounter: Payer: Self-pay | Admitting: *Deleted

## 2013-12-20 ENCOUNTER — Other Ambulatory Visit: Payer: Medicare Other

## 2013-12-20 ENCOUNTER — Encounter: Payer: Self-pay | Admitting: Internal Medicine

## 2013-12-20 ENCOUNTER — Ambulatory Visit (INDEPENDENT_AMBULATORY_CARE_PROVIDER_SITE_OTHER): Payer: Medicare Other | Admitting: *Deleted

## 2013-12-20 VITALS — BP 140/82 | HR 99 | Ht 75.0 in | Wt 287.2 lb

## 2013-12-20 DIAGNOSIS — Z79899 Other long term (current) drug therapy: Secondary | ICD-10-CM | POA: Diagnosis not present

## 2013-12-20 DIAGNOSIS — I4891 Unspecified atrial fibrillation: Secondary | ICD-10-CM

## 2013-12-20 DIAGNOSIS — I10 Essential (primary) hypertension: Secondary | ICD-10-CM | POA: Diagnosis not present

## 2013-12-20 LAB — BASIC METABOLIC PANEL
BUN: 17 mg/dL (ref 6–23)
CO2: 26 mEq/L (ref 19–32)
Calcium: 9.2 mg/dL (ref 8.4–10.5)
Chloride: 107 mEq/L (ref 96–112)
Creatinine, Ser: 1.1 mg/dL (ref 0.4–1.5)
GFR: 90.95 mL/min (ref >60.00)
Glucose, Bld: 89 mg/dL (ref 70–99)
Potassium: 3.9 mEq/L (ref 3.5–5.1)
Sodium: 140 mEq/L (ref 135–145)

## 2013-12-20 LAB — CBC WITH DIFFERENTIAL/PLATELET
Basophils Absolute: 0 10*3/uL (ref 0.0–0.1)
Basophils Relative: 0.5 % (ref 0.0–3.0)
Eosinophils Absolute: 0.1 10*3/uL (ref 0.0–0.7)
Eosinophils Relative: 2.8 % (ref 0.0–5.0)
HCT: 38.3 % — ABNORMAL LOW (ref 39.0–52.0)
Hemoglobin: 12.6 g/dL — ABNORMAL LOW (ref 13.0–17.0)
Lymphocytes Relative: 27.3 % (ref 12.0–46.0)
Lymphs Abs: 1.5 10*3/uL (ref 0.7–4.0)
MCHC: 32.7 g/dL (ref 30.0–36.0)
MCV: 86.4 fl (ref 78.0–100.0)
Monocytes Absolute: 0.5 10*3/uL (ref 0.1–1.0)
Monocytes Relative: 10.1 % (ref 3.0–12.0)
Neutro Abs: 3.2 10*3/uL (ref 1.4–7.7)
Neutrophils Relative %: 59.3 % (ref 43.0–77.0)
Platelets: 250 10*3/uL (ref 150.0–400.0)
RBC: 4.44 Mil/uL (ref 4.22–5.81)
RDW: 14.6 % (ref 11.5–14.6)
WBC: 5.4 10*3/uL (ref 4.5–10.5)

## 2013-12-20 LAB — MAGNESIUM: Magnesium: 1.9 mg/dL (ref 1.5–2.5)

## 2013-12-20 NOTE — Progress Notes (Signed)
Pt in for an EKG. EKG done per nurse and read per Dr. Ladona Ridgel MD. A-fibrillation 99 beats/minute BP 140/82 right arm. Pt is scheduled for Cardioversion with Dr. Eden Emms on 12/21/13 at 1:30 PM. Pt is to arrive at 12:00 Noon. Labs done today: CBC w/diff BMET, Magnesium. Written  Instructions given to pt.

## 2013-12-21 ENCOUNTER — Encounter (HOSPITAL_COMMUNITY)
Admission: RE | Disposition: A | Discharge status: Home / Self Care | Payer: Medicare Other | Source: Ambulatory Visit | Attending: Cardiovascular Disease

## 2013-12-21 ENCOUNTER — Other Ambulatory Visit: Payer: Self-pay | Admitting: Cardiovascular Disease

## 2013-12-21 ENCOUNTER — Encounter (HOSPITAL_COMMUNITY): Payer: Medicare Other | Admitting: Anesthesiology

## 2013-12-21 ENCOUNTER — Ambulatory Visit (HOSPITAL_COMMUNITY): Payer: Medicare Other | Admitting: Anesthesiology

## 2013-12-21 ENCOUNTER — Ambulatory Visit (HOSPITAL_COMMUNITY)
Admission: RE | Admit: 2013-12-21 | Discharge: 2013-12-21 | Disposition: A | Discharge status: Home / Self Care | Payer: Medicare Other | Source: Ambulatory Visit | Attending: Cardiovascular Disease | Admitting: Cardiovascular Disease

## 2013-12-21 DIAGNOSIS — I4891 Unspecified atrial fibrillation: Secondary | ICD-10-CM | POA: Diagnosis not present

## 2013-12-21 DIAGNOSIS — G4733 Obstructive sleep apnea (adult) (pediatric): Secondary | ICD-10-CM

## 2013-12-21 DIAGNOSIS — I517 Cardiomegaly: Secondary | ICD-10-CM | POA: Diagnosis not present

## 2013-12-21 DIAGNOSIS — I10 Essential (primary) hypertension: Secondary | ICD-10-CM | POA: Insufficient documentation

## 2013-12-21 DIAGNOSIS — I498 Other specified cardiac arrhythmias: Secondary | ICD-10-CM

## 2013-12-21 DIAGNOSIS — E669 Obesity, unspecified: Secondary | ICD-10-CM | POA: Diagnosis not present

## 2013-12-21 DIAGNOSIS — Z7901 Long term (current) use of anticoagulants: Secondary | ICD-10-CM | POA: Insufficient documentation

## 2013-12-21 DIAGNOSIS — R9431 Abnormal electrocardiogram [ECG] [EKG]: Secondary | ICD-10-CM

## 2013-12-21 DIAGNOSIS — D649 Anemia, unspecified: Secondary | ICD-10-CM

## 2013-12-21 HISTORY — PX: CARDIOVERSION: SHX1299

## 2013-12-21 SURGERY — CARDIOVERSION
Anesthesia: Monitor Anesthesia Care

## 2013-12-21 MED ORDER — LIDOCAINE HCL (CARDIAC) 20 MG/ML IV SOLN
INTRAVENOUS | Status: DC | PRN
  Administered 2013-12-21: 60 mg via INTRAVENOUS

## 2013-12-21 MED ORDER — SODIUM CHLORIDE 0.9 % IV SOLN
INTRAVENOUS | Status: DC
  Administered 2013-12-21: 500 mL via INTRAVENOUS

## 2013-12-21 MED ORDER — PROPOFOL 10 MG/ML IV BOLUS
INTRAVENOUS | Status: AC
  Filled 2013-12-21: qty 20

## 2013-12-21 MED ORDER — LIDOCAINE HCL (CARDIAC) 20 MG/ML IV SOLN
INTRAVENOUS | Status: AC
  Filled 2013-12-21: qty 5

## 2013-12-21 MED ORDER — PROPOFOL 10 MG/ML IV BOLUS
INTRAVENOUS | Status: DC | PRN
  Administered 2013-12-21: 40 mg via INTRAVENOUS
  Administered 2013-12-21: 80 mg via INTRAVENOUS

## 2013-12-21 NOTE — Progress Notes (Signed)
Patient complaining of pain at cardioversion pads site on chest and back Dr. Eden Emms notified, percocet to be offered to patient. Patient decline to take percocet states he "do not want narcotics" will call Dr Eden Emms if he changes his mind.

## 2013-12-21 NOTE — Interval H&P Note (Signed)
History and Physical Interval Note:  12/21/2013 11:01 AM  Maurice Golden  has presented today for surgery, with the diagnosis of A FIB   The various methods of treatment have been discussed with the patient and family. After consideration of risks, benefits and other options for treatment, the patient has consented to  Procedure(s): CARDIOVERSION ([name]) as a surgical intervention .  The patient's history has been reviewed, patient examined, no change in status, stable for surgery.  I have reviewed the patient's chart and labs.  Questions were answered to the patient's satisfaction.     Charlton Haws

## 2013-12-21 NOTE — Transfer of Care (Signed)
Immediate Anesthesia Transfer of Care Note  Patient: Maurice Golden  Procedure(s) Performed: Procedure(s): CARDIOVERSION ([name])  Patient Location: Endoscopy Unit  Anesthesia Type:General  Level of Consciousness: awake, alert , oriented and patient cooperative  Airway & Oxygen Therapy: Patient Spontanous Breathing and Patient connected to nasal cannula oxygen  Post-op Assessment: Report given to PACU RN, Post -op Vital signs reviewed and stable and Patient moving all extremities  Post vital signs: Reviewed and stable  Complications: No apparent anesthesia complications

## 2013-12-21 NOTE — Discharge Instructions (Addendum)
Conitnue xarelto Continue Multaq F/U Dr Johney Frame office will call with appt May resume diet when fully alert Conscious Sedation, Adult, Care After Refer to this sheet in the next few weeks. These instructions provide you with information on caring for yourself after your procedure. Your health care provider may also give you more specific instructions. Your treatment has been planned according to current medical practices, but problems sometimes occur. Call your health care provider if you have any problems or questions after your procedure. WHAT TO EXPECT AFTER THE PROCEDURE  After your procedure:  You may feel sleepy, clumsy, and have poor balance for several hours.  Vomiting may occur if you eat too soon after the procedure. HOME CARE INSTRUCTIONS  Do not participate in any activities where you could become injured for at least 24 hours. Do not:  Drive.  Swim.  Ride a bicycle.  Operate heavy machinery.  Cook.  Use power tools.  Climb ladders.  Work from a high place.  Do not make important decisions or sign legal documents until you are improved.  If you vomit, drink water, juice, or soup when you can drink without vomiting. Make sure you have little or no nausea before eating solid foods.  Only take over-the-counter or prescription medicines for pain, discomfort, or fever as directed by your health care provider.  Make sure you and your family fully understand everything about the medicines given to you, including what side effects may occur.  You should not drink alcohol, take sleeping pills, or take medicines that cause drowsiness for at least 24 hours.  If you smoke, do not smoke without supervision.  If you are feeling better, you may resume normal activities 24 hours after you were sedated.  Keep all appointments with your health care provider. SEEK MEDICAL CARE IF:  Your skin is pale or bluish in color.  You continue to feel nauseous or vomit.  Your pain is  getting worse and is not helped by medicine.  You have bleeding or swelling.  You are still sleepy or feeling clumsy after 24 hours. SEEK IMMEDIATE MEDICAL CARE IF:  You develop a rash.  You have difficulty breathing.  You develop any type of allergic problem.  You have a fever. MAKE SURE YOU:  Understand these instructions.  Will watch your condition.  Will get help right away if you are not doing well or get worse. Document Released: 07/20/2013 Document Reviewed: 05/06/2013 [facility] Patient Information 2014 [place], [place]. Electrical Cardioversion, Care After Refer to this sheet in the next few weeks. These instructions provide you with information on caring for yourself after your procedure. Your health care provider may also give you more specific instructions. Your treatment has been planned according to current medical practices, but problems sometimes occur. Call your health care provider if you have any problems or questions after your procedure. WHAT TO EXPECT AFTER THE PROCEDURE After your procedure, it is typical to have the following sensations:  Some redness on the skin where the shocks were delivered. If this is tender, a sunburn lotion or hydrocortisone cream may help.  Possible return of an abnormal heart rhythm within hours or days after the procedure. HOME CARE INSTRUCTIONS  Only take medicine as directed by your health care provider. Be sure you understand how and when to take your medicine.  Learn how to feel your pulse and check it often.  Limit your activity for 48 hours after the procedure or as directed.  Avoid or minimize caffeine and  other stimulants as directed. SEEK MEDICAL CARE IF:  You feel like your heart is beating too fast or your pulse is not regular.  You have any questions about your medicines.  You have bleeding that will not stop. SEEK IMMEDIATE MEDICAL CARE IF:  You are dizzy or feel faint.  It is hard to breathe or you feel  short of breath.  There is a change in discomfort in your chest.  Your speech is slurred or you have trouble moving an arm or leg on one side of your body.  You get a serious muscle cramp that does not go away.  Your fingers or toes turn cold or blue. MAKE SURE YOU:   Understand these instructions.   Will watch your condition.   Will get help right away if you are not doing well or get worse. Document Released: 07/20/2013 Document Reviewed: 04/13/2013 [facility] Patient Information 2014 [place], [place].

## 2013-12-21 NOTE — CV Procedure (Signed)
DCC: Indication: Afib Propofol 120mg  40 mg lidocaine given by Dr Milon Dikes x3 biphasic 120/200/200  Converted from afib rate 108 to NSR rate 72  On Rx xarelto with compliance  F/u with Dr Zackery Barefoot

## 2013-12-21 NOTE — Preoperative (Signed)
Beta Blockers   Reason not to administer Beta Blockers:Not Applicable 

## 2013-12-21 NOTE — Anesthesia Postprocedure Evaluation (Signed)
  Anesthesia Post-op Note  Patient: [name]  Procedure(s) Performed: Procedure(s): CARDIOVERSION ([name])  Patient Location: Endoscopy Unit  Anesthesia Type:General  Level of Consciousness: awake, alert , oriented and patient cooperative  Airway and Oxygen Therapy: Patient Spontanous Breathing and Patient connected to nasal cannula oxygen  Post-op Pain: none  Post-op Assessment: Post-op Vital signs reviewed, Patient's Cardiovascular Status Stable, Respiratory Function Stable, Patent Airway, No signs of Nausea or vomiting and Pain level controlled  Post-op Vital Signs: Reviewed and stable  Complications: No apparent anesthesia complications

## 2013-12-21 NOTE — Anesthesia Procedure Notes (Signed)
Procedure Name: MAC Date/Time: 12/21/2013 1:37 PM Performed by: Jerilee Hoh Pre-anesthesia Checklist: Patient identified, Emergency Drugs available, Suction available, Patient being monitored and Timeout performed Patient Re-evaluated:Patient Re-evaluated prior to inductionOxygen Delivery Method: Ambu bag Intubation Type: IV induction Placement Confirmation: breath sounds checked- equal and bilateral

## 2013-12-21 NOTE — Anesthesia Preprocedure Evaluation (Addendum)
Anesthesia Evaluation  Patient identified by MRN, date of birth, ID band Patient awake    Reviewed: Allergy & Precautions, H&P , NPO status , Patient's Chart, lab work & pertinent test results, reviewed documented beta blocker date and time   Airway Mallampati: II TM Distance: >3 FB Neck ROM: Full    Dental  (+) Teeth Intact   Pulmonary sleep apnea ,          Cardiovascular hypertension, Pt. on medications + dysrhythmias Atrial Fibrillation Rhythm:Irregular     Neuro/Psych    GI/Hepatic negative GI ROS, Neg liver ROS,   Endo/Other  negative endocrine ROS  Renal/GU negative Renal ROS     Musculoskeletal   Abdominal   Peds  Hematology  (+) anemia ,   Anesthesia Other Findings   Reproductive/Obstetrics                          Anesthesia Physical Anesthesia Plan  ASA: III  Anesthesia Plan: General   Post-op Pain Management:    Induction: Intravenous  Airway Management Planned: Mask  Additional Equipment:   Intra-op Plan:   Post-operative Plan:   Informed Consent: I have reviewed the patients History and Physical, chart, labs and discussed the procedure including the risks, benefits and alternatives for the proposed anesthesia with the patient or authorized representative who has indicated his/her understanding and acceptance.   Dental advisory given  Plan Discussed with: Anesthesiologist, Surgeon and CRNA  Anesthesia Plan Comments:        Anesthesia Quick Evaluation

## 2013-12-21 NOTE — H&P (View-Only) (Signed)
PCP:  Maurice Saupe, MD  The patient presents today for electrophysiology followup.  He is back in afib.  He reports fatigue with this.  He reports compliance with medicines and CPAP.  Today, he denies symptoms of chest pain, orthopnea, PND, lower extremity edema, dizziness, presyncope, syncope, or neurologic sequela.  The patient is otherwise without complaint today.   Past Medical History  Diagnosis Date  . Paroxysmal atrial fibrillation   . Obesity   . ANEMIA   . BRADYCARDIA   . OSA (obstructive sleep apnea)   . Bradycardia   . Atrial fibrillation with RVR    Past Surgical History  Procedure Laterality Date  . Bilateral knee arthroscopy    . Appendectomy    . L eye cataract    . Tee without cardioversion  04/29/2012    Procedure: TRANSESOPHAGEAL ECHOCARDIOGRAM (TEE);  Surgeon: Vesta Mixer, MD;  Location: [facility] ENDOSCOPY;  Service: Cardiovascular;  Laterality: [name];  changed from nish to nahser/dl  . Cardioversion  04/29/2012    Procedure: CARDIOVERSION;  Surgeon: Vesta Mixer, MD;  Location: [facility] ENDOSCOPY;  Service: Cardiovascular;  Laterality: [name];    Current Outpatient Prescriptions  Medication Sig Dispense Refill  . amLODipine (NORVASC) 5 MG tablet Take 1 tablet by mouth daily.      Marland Kitchen ibuprofen (ADVIL,MOTRIN) 200 MG tablet Take 200-600 mg by mouth every 6 (six) hours as needed for headache or moderate pain.      . metoprolol succinate (TOPROL-XL) 50 MG 24 hr tablet Take 2 tablets (100 mg total) by mouth daily. Take with or immediately following a meal.  180 tablet  3  . Rivaroxaban (XARELTO) 20 MG TABS tablet TAKE 1 TABLET BY MOUTH EVERY DAY      . dronedarone (MULTAQ) 400 MG tablet Take 1 tablet (400 mg total) by mouth 2 (two) times daily with a meal.  60 tablet  11   No current facility-administered medications for this visit.    No Known Allergies  History   Social History  . Marital Status: Single    Spouse Name: [name]    Number of Children: [name]  . Years of  Education: [name]   Occupational History  . retired    Social History Main Topics  . Smoking status: Never Smoker   . Smokeless tobacco: Not on file  . Alcohol Use: No  . Drug Use: No  . Sexual Activity: Not on file   Other Topics Concern  . Not on file   Social History Narrative   Lives with mother in [place].  Retired/ disabled from [employer].   Physical Exam: Filed Vitals:   12/14/13 1438  BP: 121/85  Pulse: 129  Height: 6\' 3"  (1.905 m)  Weight: 290 lb (131.543 kg)    GEN- The patient is well appearing, alert and oriented x 3 today.   Head- normocephalic, atraumatic Eyes-  Sclera clear, conjunctiva pink Ears- hearing intact Oropharynx- clear Neck- supple,  Lungs- Clear to ausculation bilaterally, normal work of breathing Heart- tachycardic irregular rhythm, no murmurs, rubs or gallops, PMI not laterally displaced GI- soft, NT, ND, + BS Extremities- no clubbing, cyanosis, no edema  Echo is reviewed ekg today reveals afib with V rate 129 bpm, incomplete RBBB, lAD  Assessment and Plan:  1. afib Back in afib Therapeutic strategies for afib including rate and rhythm control were discussed in detail with the patient today.  At this time, I will start multaq.  He will return in 1 week for ekg .  If in sinus, then I will see in 6 weeks.  If he remains in afib then he should be scheduled for cardioversion at that time.  2. HTN At goal Continue norvasc  3. LVH Stable No change required today

## 2013-12-22 ENCOUNTER — Encounter (HOSPITAL_COMMUNITY): Payer: Self-pay | Admitting: Cardiovascular Disease

## 2013-12-23 ENCOUNTER — Encounter: Payer: Self-pay | Admitting: Internal Medicine

## 2013-12-23 NOTE — Telephone Encounter (Deleted)
Called pt to set up eph fu appt, pt has questions re travel and medication, pls call [phone], pt aware kelly out and will get call on monday

## 2013-12-26 ENCOUNTER — Encounter: Payer: Self-pay | Admitting: Internal Medicine

## 2013-12-26 ENCOUNTER — Ambulatory Visit (INDEPENDENT_AMBULATORY_CARE_PROVIDER_SITE_OTHER): Payer: Medicare Other | Admitting: Internal Medicine

## 2013-12-26 VITALS — BP 114/80 | HR 112 | Ht 75.0 in | Wt 284.0 lb

## 2013-12-26 DIAGNOSIS — G4733 Obstructive sleep apnea (adult) (pediatric): Secondary | ICD-10-CM

## 2013-12-26 DIAGNOSIS — I4891 Unspecified atrial fibrillation: Secondary | ICD-10-CM

## 2013-12-26 DIAGNOSIS — I10 Essential (primary) hypertension: Secondary | ICD-10-CM

## 2013-12-26 MED ORDER — DILTIAZEM HCL ER COATED BEADS 120 MG PO CP24: 120.0000 mg | ORAL_CAPSULE | Freq: Every day | ORAL | Status: DC

## 2013-12-26 NOTE — Telephone Encounter (Signed)
  This encounter was created in error - please disregard.  Message an error  Patient added on to schedule today

## 2013-12-26 NOTE — Patient Instructions (Signed)
Your physician has recommended that you have an ablation. Catheter ablation is a medical procedure used to treat some cardiac arrhythmias (irregular heartbeats). During catheter ablation, a long, thin, flexible tube is put into a blood vessel in your groin (upper thigh), or neck. This tube is called an ablation catheter. It is then guided to your heart through the blood vessel. Radio frequency waves destroy small areas of heart tissue where abnormal heartbeats may cause an arrhythmia to start. Please see the instruction sheet given to you today.    Your physician has recommended you make the following change in your medication:  1) Stop Multaq 2) Start Cardizem 120mg  daily

## 2013-12-26 NOTE — Progress Notes (Signed)
PCP:  Cain Saupe, MD  The patient presents today for electrophysiology followup.  He was cardioverted 12/21/13.  He reports having "much more energy" post cardioversion but has unfortunately already returned to afib.  V rates are once again elevated. Today, he denies symptoms of palpitations, chest pain, shortness of breath, orthopnea, PND, lower extremity edema, dizziness, presyncope, syncope, or neurologic sequela.  The patient feels that he is tolerating medications without difficulties and is otherwise without complaint today.   Past Medical History  Diagnosis Date  . Paroxysmal atrial fibrillation   . Obesity   . ANEMIA   . BRADYCARDIA   . OSA (obstructive sleep apnea)   . Bradycardia   . Atrial fibrillation with RVR    Past Surgical History  Procedure Laterality Date  . Bilateral knee arthroscopy    . Appendectomy    . L eye cataract    . Tee without cardioversion  04/29/2012    Procedure: TRANSESOPHAGEAL ECHOCARDIOGRAM (TEE);  Surgeon: Vesta Mixer, MD;  Location: [facility] ENDOSCOPY;  Service: Cardiovascular;  Laterality: [name];  changed from nish to nahser/dl  . Cardioversion  04/29/2012    Procedure: CARDIOVERSION;  Surgeon: Vesta Mixer, MD;  Location: [facility] ENDOSCOPY;  Service: Cardiovascular;  Laterality: [name];  . Cardioversion [name] 12/21/2013    Procedure: CARDIOVERSION;  Surgeon: Wendall Stade, MD;  Location: [facility] ENDOSCOPY;  Service: Cardiovascular;  Laterality: [name];    Current Outpatient Prescriptions  Medication Sig Dispense Refill  . amLODipine (NORVASC) 5 MG tablet Take 1 tablet by mouth daily.      . metoprolol succinate (TOPROL-XL) 50 MG 24 hr tablet Take 2 tablets (100 mg total) by mouth daily. Take with or immediately following a meal.  180 tablet  3  . Rivaroxaban (XARELTO) 20 MG TABS tablet TAKE 1 TABLET BY MOUTH EVERY DAY      . diltiazem (CARDIZEM CD) 120 MG 24 hr capsule Take 1 capsule (120 mg total) by mouth daily.  90 capsule  3   No current  facility-administered medications for this visit.    No Known Allergies  History   Social History  . Marital Status: Single    Spouse Name: [name]    Number of Children: [name]  . Years of Education: [name]   Occupational History  . retired    Social History Main Topics  . Smoking status: Never Smoker   . Smokeless tobacco: Not on file  . Alcohol Use: No  . Drug Use: No  . Sexual Activity: Not on file   Other Topics Concern  . Not on file   Social History Narrative   Lives with mother in [place].  Retired/ disabled from [employer].    Family History  Problem Relation Age of Onset  . Diabetes Mother   . Hypertension Mother   . Hypertension Father     ROS-  All systems are reviewed and are negative except as outlined in the HPI above  Physical Exam: Filed Vitals:   12/26/13 1256  BP: 114/80  Pulse: 112  Height: 6\' 3"  (1.905 m)  Weight: 284 lb (128.822 kg)    GEN- The patient is well appearing, alert and oriented x 3 today.   Head- normocephalic, atraumatic Eyes-  Sclera clear, conjunctiva pink Ears- hearing intact Oropharynx- clear Neck- supple, no JVP Lymph- no cervical lymphadenopathy Lungs- Clear to ausculation bilaterally, normal work of breathing Heart- tachycardic irregular rhythm, no murmurs, rubs or gallops, PMI not laterally displaced GI- soft, NT, ND, + BS  Extremities- no clubbing, cyanosis, or edema MS- no significant deformity or atrophy Skin- no rash or lesion Psych- euthymic mood, full affect Neuro- strength and sensation are intact  ekg reveals afib with V rate 117 bpm Echo is reviewed,  LA size is 53 mm  Assessment and Plan:  1.  The patient has persistent afib.  He has failed medical therapy with multaq and metoprolol.  He is not a candidate for Ics due to severe LVH.  Therapeutic strategies for afib including medicine (tikosyn) and ablation were discussed in detail with the patient today. Risk, benefits, and alternatives to EP study and  radiofrequency ablation for afib were also discussed in detail today. These risks include but are not limited to stroke, bleeding, vascular damage, tamponade, perforation, damage to the esophagus, lungs, and other structures, pulmonary vein stenosis, worsening renal function, and death. The patient understands these risk and wishes to proceed. He understands that anticipated success rates are 60% with a 1/4 chance of multiple procedures required.   We will therefore proceed with catheter ablation at the next available time.  Stop multaq today.  Add diltiazem for rate control.  2. htn Stable No change required today  3. osa Compliance with CPAP is encouraged

## 2013-12-29 ENCOUNTER — Encounter: Payer: Self-pay | Admitting: *Deleted

## 2013-12-29 ENCOUNTER — Other Ambulatory Visit: Payer: Self-pay | Admitting: *Deleted

## 2013-12-29 DIAGNOSIS — I4891 Unspecified atrial fibrillation: Secondary | ICD-10-CM

## 2013-12-29 DIAGNOSIS — Z01810 Encounter for preprocedural cardiovascular examination: Secondary | ICD-10-CM

## 2014-01-02 ENCOUNTER — Encounter: Payer: Medicare Other | Admitting: Internal Medicine

## 2014-01-04 ENCOUNTER — Telehealth: Payer: Self-pay | Admitting: Internal Medicine

## 2014-01-04 NOTE — Telephone Encounter (Signed)
Missed his dose of blood thinner last night but resumed today.  I told him the importance of not skipping doses and he verbalizes understanding with his upcoming ablation

## 2014-01-04 NOTE — Telephone Encounter (Signed)
New message ° ° ° ° ° °Talk to a nurse regarding his medication °

## 2014-01-06 DIAGNOSIS — M25569 Pain in unspecified knee: Secondary | ICD-10-CM | POA: Diagnosis not present

## 2014-01-06 DIAGNOSIS — IMO0002 Reserved for concepts with insufficient information to code with codable children: Secondary | ICD-10-CM | POA: Diagnosis not present

## 2014-01-06 DIAGNOSIS — M1710 Unilateral primary osteoarthritis, unspecified knee: Secondary | ICD-10-CM | POA: Diagnosis not present

## 2014-01-09 DIAGNOSIS — M1710 Unilateral primary osteoarthritis, unspecified knee: Secondary | ICD-10-CM | POA: Diagnosis not present

## 2014-01-12 ENCOUNTER — Encounter (HOSPITAL_COMMUNITY): Payer: Self-pay | Admitting: Pharmacist

## 2014-01-19 ENCOUNTER — Other Ambulatory Visit (INDEPENDENT_AMBULATORY_CARE_PROVIDER_SITE_OTHER): Payer: Medicare Other

## 2014-01-19 DIAGNOSIS — Z01810 Encounter for preprocedural cardiovascular examination: Secondary | ICD-10-CM

## 2014-01-19 DIAGNOSIS — I4891 Unspecified atrial fibrillation: Secondary | ICD-10-CM | POA: Diagnosis not present

## 2014-01-19 LAB — BASIC METABOLIC PANEL
BUN: 16 mg/dL (ref 6–23)
CO2: 29 mEq/L (ref 19–32)
Calcium: 9 mg/dL (ref 8.4–10.5)
Chloride: 104 mEq/L (ref 96–112)
Creatinine, Ser: 0.9 mg/dL (ref 0.4–1.5)
GFR: 107.86 mL/min (ref >60.00)
Glucose, Bld: 108 mg/dL — ABNORMAL HIGH (ref 70–99)
Potassium: 3.9 mEq/L (ref 3.5–5.1)
Sodium: 138 mEq/L (ref 135–145)

## 2014-01-19 LAB — CBC WITH DIFFERENTIAL/PLATELET
Basophils Absolute: 0 10*3/uL (ref 0.0–0.1)
Basophils Relative: 0.3 % (ref 0.0–3.0)
Eosinophils Absolute: 0.1 10*3/uL (ref 0.0–0.7)
Eosinophils Relative: 1.2 % (ref 0.0–5.0)
HCT: 39.1 % (ref 39.0–52.0)
Hemoglobin: 12.9 g/dL — ABNORMAL LOW (ref 13.0–17.0)
Lymphocytes Relative: 19.7 % (ref 12.0–46.0)
Lymphs Abs: 1.7 10*3/uL (ref 0.7–4.0)
MCHC: 33 g/dL (ref 30.0–36.0)
MCV: 85.4 fl (ref 78.0–100.0)
Monocytes Absolute: 0.8 10*3/uL (ref 0.1–1.0)
Monocytes Relative: 9.6 % (ref 3.0–12.0)
Neutro Abs: 6.1 10*3/uL (ref 1.4–7.7)
Neutrophils Relative %: 69.2 % (ref 43.0–77.0)
Platelets: 244 10*3/uL (ref 150.0–400.0)
RBC: 4.58 Mil/uL (ref 4.22–5.81)
RDW: 14.8 % — ABNORMAL HIGH (ref 11.5–14.6)
WBC: 8.8 10*3/uL (ref 4.5–10.5)

## 2014-01-20 ENCOUNTER — Telehealth: Payer: Self-pay | Admitting: Internal Medicine
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
## 2014-01-20 NOTE — Telephone Encounter (Signed)
New message     Need to ask Maurice Golden a question.  Pt would not tell me the question.

## 2014-01-25 ENCOUNTER — Encounter (HOSPITAL_COMMUNITY): Payer: Self-pay | Admitting: *Deleted

## 2014-01-25 ENCOUNTER — Ambulatory Visit (HOSPITAL_COMMUNITY)
Admission: RE | Admit: 2014-01-25 | Discharge: 2014-01-25 | Disposition: A | Discharge status: Home / Self Care | Payer: Medicare Other | Source: Ambulatory Visit | Attending: Internal Medicine | Admitting: Internal Medicine

## 2014-01-25 ENCOUNTER — Encounter (HOSPITAL_COMMUNITY)
Admission: RE | Disposition: A | Discharge status: Home / Self Care | Payer: Self-pay | Source: Ambulatory Visit | Attending: Internal Medicine

## 2014-01-25 DIAGNOSIS — I059 Rheumatic mitral valve disease, unspecified: Secondary | ICD-10-CM | POA: Diagnosis not present

## 2014-01-25 DIAGNOSIS — I428 Other cardiomyopathies: Secondary | ICD-10-CM | POA: Insufficient documentation

## 2014-01-25 DIAGNOSIS — I10 Essential (primary) hypertension: Secondary | ICD-10-CM | POA: Diagnosis not present

## 2014-01-25 DIAGNOSIS — Z7901 Long term (current) use of anticoagulants: Secondary | ICD-10-CM | POA: Diagnosis not present

## 2014-01-25 DIAGNOSIS — I429 Cardiomyopathy, unspecified: Secondary | ICD-10-CM | POA: Diagnosis present

## 2014-01-25 DIAGNOSIS — G4733 Obstructive sleep apnea (adult) (pediatric): Secondary | ICD-10-CM | POA: Diagnosis not present

## 2014-01-25 DIAGNOSIS — Q2111 Secundum atrial septal defect: Secondary | ICD-10-CM | POA: Diagnosis not present

## 2014-01-25 DIAGNOSIS — E669 Obesity, unspecified: Secondary | ICD-10-CM | POA: Diagnosis not present

## 2014-01-25 DIAGNOSIS — I4891 Unspecified atrial fibrillation: Secondary | ICD-10-CM | POA: Insufficient documentation

## 2014-01-25 DIAGNOSIS — Q211 Atrial septal defect: Secondary | ICD-10-CM | POA: Diagnosis not present

## 2014-01-25 DIAGNOSIS — I498 Other specified cardiac arrhythmias: Secondary | ICD-10-CM | POA: Insufficient documentation

## 2014-01-25 HISTORY — PX: TEE WITHOUT CARDIOVERSION: SHX5443

## 2014-01-25 SURGERY — ECHOCARDIOGRAM, TRANSESOPHAGEAL
Anesthesia: Moderate Sedation

## 2014-01-25 MED ORDER — LIDOCAINE VISCOUS 2 % MT SOLN
OROMUCOSAL | Status: DC | PRN
  Administered 2014-01-25: 15 mL via OROMUCOSAL

## 2014-01-25 MED ORDER — SODIUM CHLORIDE 0.9 % IV SOLN
INTRAVENOUS | Status: DC
  Administered 2014-01-25: 500 mL via INTRAVENOUS

## 2014-01-25 MED ORDER — MIDAZOLAM HCL 10 MG/2ML IJ SOLN
INTRAMUSCULAR | Status: DC | PRN
  Administered 2014-01-25 (×2): 2 mg via INTRAVENOUS

## 2014-01-25 MED ORDER — FENTANYL CITRATE 0.05 MG/ML IJ SOLN
INTRAMUSCULAR | Status: AC
  Filled 2014-01-25: qty 2

## 2014-01-25 MED ORDER — LIDOCAINE VISCOUS 2 % MT SOLN
OROMUCOSAL | Status: AC
  Filled 2014-01-25: qty 15

## 2014-01-25 MED ORDER — MIDAZOLAM HCL 5 MG/ML IJ SOLN
INTRAMUSCULAR | Status: AC
  Filled 2014-01-25: qty 2

## 2014-01-25 MED ORDER — BUTAMBEN-TETRACAINE-BENZOCAINE 2-2-14 % EX AERO
INHALATION_SPRAY | CUTANEOUS | Status: DC | PRN
  Administered 2014-01-25: 1 via TOPICAL

## 2014-01-25 MED ORDER — FENTANYL CITRATE 0.05 MG/ML IJ SOLN
INTRAMUSCULAR | Status: DC | PRN
  Administered 2014-01-25 (×2): 25 ug via INTRAVENOUS

## 2014-01-25 MED ORDER — DIPHENHYDRAMINE HCL 50 MG/ML IJ SOLN
INTRAMUSCULAR | Status: AC
  Filled 2014-01-25: qty 1

## 2014-01-25 NOTE — Progress Notes (Signed)
  Echocardiogram Echocardiogram Transesophageal has been performed.  Maurice Golden 01/25/2014, 12:41 PM

## 2014-01-25 NOTE — Discharge Instructions (Signed)
Transesophageal Echocardiography °Transesophageal echocardiography (TEE) is a special type of test that produces images of the heart by using sound waves (echocardiogram). This type of echocardiography can obtain better images of the heart than standard echocardiography. TEE is done by passing a flexible tube down the esophagus. The heart is located in front of the esophagus. Because the heart and esophagus are close to one another, your health care provider can take very clear, detailed pictures of the heart via ultrasound waves. °TEE may be done: °· If your health care provider needs more information based on standard echocardiography findings. °· If you had a stroke. This might have happened because a clot formed in your heart. TEE can visualize different areas of the heart and check for clots. °· To check valve anatomy and function. °· To check for infection on the inside of your heart (endocarditis). °· To evaluate the dividing wall (septum) of the heart and presence of a hole that did not close after birth (patent foramen ovale or atrial septal defect). °· To help diagnose a tear in the wall of the aorta (aortic dissection). °· During cardiac valve surgery. This allows the surgeon to assess the valve repair before closing the chest. °· During a variety of other cardiac procedures to guide positioning of catheters. °· Sometimes before a cardioversion, which is a shock to convert heart rhythm back to normal. °LET YOUR HEALTH CARE PROVIDER KNOW ABOUT:  °· Any allergies you have. °· All medicines you are taking, including vitamins, herbs, eye drops, creams, and over-the-counter medicines. °· Previous problems you or members of your family have had with the use of anesthetics. °· Any blood disorders you have. °· Previous surgeries you have had. °· Medical conditions you have. °· Swallowing difficulties. °· An esophageal obstruction. °RISKS AND COMPLICATIONS  °Generally, TEE is a safe procedure. However, as with any  procedure, complications can occur. Possible complications include an esophageal tear (rupture). °BEFORE THE PROCEDURE  °· Do not eat or drink for 6 hours before the procedure or as directed by your health care provider. °· Arrange for someone to drive you home after the procedure. Do not drive yourself home. During the procedure, you will be given medicines that can continue to make you feel drowsy and can impair your reflexes. °· An IV access tube will be started in the arm. °PROCEDURE  °· A medicine to help you relax (sedative) will be given through the IV access tube. °· A medicine that numbs the area (local anesthetic) may be sprayed in the back of the throat. °· Your blood pressure, heart rate, and breathing (vital signs) will be monitored during the procedure. °· The TEE probe is a long, flexible tube. The tip of the probe is placed into the back of the mouth, and you will be asked to swallow. This helps to pass the tip of the probe into the esophagus. Once the tip of the probe is in the correct area, your health care provider can take pictures of the heart. °· TEE is usually not a painful procedure. You may feel the probe press against the back of the throat. The probe does not enter the trachea and does not affect your breathing. °· Your time spent at the hospital is usually less than 2 hours. °AFTER THE PROCEDURE  °· You will be in bed, resting, until you have fully returned to consciousness. °· When you first awaken, your throat may feel slightly sore and will probably still feel numb. This will   improve slowly over time. °· You will not be allowed to eat or drink until it is clear that the numbness has improved. °· Once you have been able to drink, urinate, and sit on the edge of the bed without feeling sick to your stomach (nausea) or dizzy, you may be cleared to go home. °· You should have a friend or family member with you for the next 24 hours after your procedure. °Document Released: 12/20/2002  Document Revised: 07/20/2013 Document Reviewed: 03/31/2013 °ExitCare® Patient Information ©2014 [employer]. °Moderate Sedation, Adult °Moderate sedation is given to help you relax or even sleep through a procedure. You may remain sleepy, be clumsy, or have poor balance for several hours following this procedure. Arrange for a responsible adult, family member, or friend to take you home. A responsible adult should stay with you for at least 24 hours or until the medicines have worn off. °· Do not participate in any activities where you could become injured for the next 24 hours, or until you feel normal again. Do not: °· Drive. °· Swim. °· Ride a bicycle. °· Operate heavy machinery. °· Cook. °· Use power tools. °· Climb ladders. °· Work at heights. °· Do not make important decisions or sign legal documents until you are improved. °· Vomiting may occur if you eat too soon. When you can drink without vomiting, try water, juice, or soup. Try solid foods if you feel little or no nausea. °· Only take over-the-counter or prescription medications for pain, discomfort, or fever as directed by your caregiver.If pain medications have been prescribed for you, ask your caregiver how soon it is safe to take them. °· Make sure you and your family fully understands everything about the medication given to you. Make sure you understand what side effects may occur. °· You should not drink alcohol, take sleeping pills, or medications that cause drowsiness for at least 24 hours. °· If you smoke, do not smoke alone. °· If you are feeling better, you may resume normal activities 24 hours after receiving sedation. °· Keep all appointments as scheduled. Follow all instructions. °· Ask questions if you do not understand. °SEEK MEDICAL CARE IF:  °· Your skin is pale or bluish in color. °· You continue to feel sick to your stomach (nauseous) or throw up (vomit). °· Your pain is getting worse and not helped by medication. °· You have  bleeding or swelling. °· You are still sleepy or feeling clumsy after 24 hours. °SEEK IMMEDIATE MEDICAL CARE IF:  °· You develop a rash. °· You have difficulty breathing. °· You develop any type of allergic problem. °· You have a fever. °Document Released: 06/24/2001 Document Revised: 12/22/2011 Document Reviewed: 06/06/2013 °ExitCare® Patient Information ©2014 [employer]. ° °

## 2014-01-25 NOTE — H&P (Signed)
     INTERVAL PROCEDURE H&P  History and Physical Interval Note:  01/25/2014 11:38 AM  Maurice Golden has presented today for their planned procedure. The various methods of treatment have been discussed with the patient and family. After consideration of risks, benefits and other options for treatment, the patient has consented to the procedure.  The patients' outpatient history has been reviewed, patient examined, and no change in status from most recent office note within the past 30 days. I have reviewed the patients' chart and labs and will proceed as planned. Questions were answered to the patient's satisfaction.   Maurice Nose, MD, [facility] Attending Cardiologist [facility]  Maurice Golden 01/25/2014, 11:38 AM

## 2014-01-25 NOTE — CV Procedure (Signed)
    TRANSESOPHAGEAL ECHOCARDIOGRAM (TEE) NOTE  INDICATIONS: atrial fibrillation, pre-ablation  PROCEDURE:   Informed consent was obtained prior to the procedure. The risks, benefits and alternatives for the procedure were discussed and the patient comprehended these risks.  Risks include, but are not limited to, cough, sore throat, vomiting, nausea, somnolence, esophageal and stomach trauma or perforation, bleeding, low blood pressure, aspiration, pneumonia, infection, trauma to the teeth and death.    After a procedural time-out, the patient was given 4 mg versed and 50 mcg fentanyl for moderate sedation.  The oropharynx was anesthetized 10 cc of topical 1% viscous lidocaine and 1 cetacaine spray  The transesophageal probe was inserted in the esophagus and stomach without difficulty and multiple views were obtained.  The patient was kept under observation until the patient left the procedure room.  The patient left the procedure room in stable condition.   Agitated microbubble saline contrast was administered.  COMPLICATIONS:    There were no immediate complications.  Findings:  1. LEFT VENTRICLE: The left ventricular wall thickness is mildly increased.  The left ventricular cavity is normal in size. Wall motion is severely hypokinetic globally.  LVEF is ~30%.  2. RIGHT VENTRICLE:  The right ventricle is normal in structure and function without any thrombus or masses.    3. LEFT ATRIUM:  The left atrium is dilated in size without any thrombus or masses.  There is spontaneous echo contrast ("smoke") in the left atrium consistent with a low flow state.  4. LEFT ATRIAL APPENDAGE:  The left atrial appendage is free of any thrombus or masses. The appendage has single lobes. Pulse doppler indicates low flow in the appendage.  5. ATRIAL SEPTUM:  The atrial septum is notable for a small PFO.  There is predominantly left to right flow by color doppler. Intermittent right to left flow was noted  by saline microbubble, with a small number of saline microbubbles noted to cross.  6. RIGHT ATRIUM:  The right atrium is normal in size and function without any thrombus or masses.  7. MITRAL VALVE:  The mitral valve is normal in structure and function with Mild regurgitation.  There is a slightly elongated anterior leaflet which malcoapts. There were no vegetations or stenosis.  8. AORTIC VALVE:  The aortic valve is normal in structure and function with no regurgitation.  There were no vegetations or stenosis  9. TRICUSPID VALVE:  The tricuspid valve is normal in structure and function with trivial regurgitation.  There were no vegetations or stenosis  10.  PULMONIC VALVE:  The pulmonic valve is normal in structure and function with no regurgitation.  There were no vegetations or stenosis.   11. AORTIC ARCH, ASCENDING AND DESCENDING AORTA:  There was no atherosclerosis of the ascending aorta, aortic arch, or proximal descending aorta.  IMPRESSION:   1. Severe global hypokinesis, EF 30%. 2. Dilated LA with spontaneous echo contrast 3. No LA thrombus noted 4. PFO with predominantly left to right flow, intermittent right to left flow with valsalva.  RECOMMENDATIONS:    1. Plans for a-fib ablation per Dr. Johney Frame. 2. Further evaluation and medication adjustment per his primary cardiologist for new cardiomyopathy.  Time Spent Directly with the Patient:  60 minutes   [name], MD, [facility] Attending Cardiologist [facility]  01/25/2014, 1:00 PM

## 2014-01-26 ENCOUNTER — Encounter (HOSPITAL_COMMUNITY): Payer: Medicare Other | Admitting: Certified Registered"

## 2014-01-26 ENCOUNTER — Ambulatory Visit (HOSPITAL_COMMUNITY)
Admission: RE | Admit: 2014-01-26 | Discharge: 2014-01-27 | Disposition: A | Discharge status: Home / Self Care | Payer: Medicare Other | Source: Ambulatory Visit | Attending: Internal Medicine | Admitting: Internal Medicine

## 2014-01-26 ENCOUNTER — Encounter (HOSPITAL_COMMUNITY): Payer: Self-pay | Admitting: Certified Registered"

## 2014-01-26 ENCOUNTER — Ambulatory Visit (HOSPITAL_COMMUNITY): Payer: Medicare Other | Admitting: Certified Registered"

## 2014-01-26 ENCOUNTER — Encounter (HOSPITAL_COMMUNITY)
Admission: RE | Disposition: A | Discharge status: Home / Self Care | Payer: Self-pay | Source: Ambulatory Visit | Attending: Internal Medicine

## 2014-01-26 DIAGNOSIS — D649 Anemia, unspecified: Secondary | ICD-10-CM | POA: Diagnosis not present

## 2014-01-26 DIAGNOSIS — Q211 Atrial septal defect: Secondary | ICD-10-CM | POA: Diagnosis not present

## 2014-01-26 DIAGNOSIS — E669 Obesity, unspecified: Secondary | ICD-10-CM | POA: Diagnosis not present

## 2014-01-26 DIAGNOSIS — I4891 Unspecified atrial fibrillation: Secondary | ICD-10-CM | POA: Diagnosis present

## 2014-01-26 DIAGNOSIS — Z7901 Long term (current) use of anticoagulants: Secondary | ICD-10-CM | POA: Insufficient documentation

## 2014-01-26 DIAGNOSIS — I498 Other specified cardiac arrhythmias: Secondary | ICD-10-CM | POA: Diagnosis not present

## 2014-01-26 DIAGNOSIS — Q2111 Secundum atrial septal defect: Secondary | ICD-10-CM | POA: Diagnosis not present

## 2014-01-26 DIAGNOSIS — G4733 Obstructive sleep apnea (adult) (pediatric): Secondary | ICD-10-CM | POA: Diagnosis not present

## 2014-01-26 DIAGNOSIS — I428 Other cardiomyopathies: Secondary | ICD-10-CM | POA: Diagnosis not present

## 2014-01-26 HISTORY — PX: ATRIAL FIBRILLATION ABLATION: SHX5456

## 2014-01-26 HISTORY — PX: ABLATION: SHX5711

## 2014-01-26 LAB — POCT ACTIVATED CLOTTING TIME
Activated Clotting Time: 265 seconds
Activated Clotting Time: 254 seconds
Activated Clotting Time: 298 seconds
Activated Clotting Time: 299 seconds
Activated Clotting Time: 193 seconds
Activated Clotting Time: 182 seconds

## 2014-01-26 SURGERY — ATRIAL FIBRILLATION ABLATION
Anesthesia: General

## 2014-01-26 MED ORDER — LIDOCAINE HCL (CARDIAC) 20 MG/ML IV SOLN
INTRAVENOUS | Status: DC | PRN
  Administered 2014-01-26: 20 mg via INTRAVENOUS

## 2014-01-26 MED ORDER — ARTIFICIAL TEARS OP OINT
TOPICAL_OINTMENT | OPHTHALMIC | Status: DC | PRN
  Administered 2014-01-26: 1 via OPHTHALMIC

## 2014-01-26 MED ORDER — RIVAROXABAN 20 MG PO TABS: 20.0000 mg | ORAL_TABLET | Freq: Every day | ORAL | Status: DC

## 2014-01-26 MED ORDER — SODIUM CHLORIDE 0.9 % IV SOLN
INTRAVENOUS | Status: DC
Start: 2014-01-26 — End: 2014-01-26
  Administered 2014-01-26 (×2): via INTRAVENOUS

## 2014-01-26 MED ORDER — HYDROCODONE-ACETAMINOPHEN 5-325 MG PO TABS
1.0000 | ORAL_TABLET | ORAL | Status: DC | PRN
  Administered 2014-01-27: 1 via ORAL
  Filled 2014-01-26 (×3): qty 2

## 2014-01-26 MED ORDER — SODIUM CHLORIDE 0.9 % IJ SOLN: 3.0000 mL | INTRAMUSCULAR | Status: DC | PRN

## 2014-01-26 MED ORDER — OXYCODONE HCL 5 MG PO TABS: 5.0000 mg | ORAL_TABLET | Freq: Once | ORAL | Status: AC | PRN

## 2014-01-26 MED ORDER — MIDAZOLAM HCL 5 MG/5ML IJ SOLN
INTRAMUSCULAR | Status: DC | PRN
  Administered 2014-01-26 (×2): 1 mg via INTRAVENOUS
  Administered 2014-01-26: 2 mg via INTRAVENOUS

## 2014-01-26 MED ORDER — OXYCODONE HCL 5 MG/5ML PO SOLN: 5.0000 mg | Freq: Once | ORAL | Status: AC | PRN

## 2014-01-26 MED ORDER — PROTAMINE SULFATE 10 MG/ML IV SOLN
INTRAVENOUS | Status: DC | PRN
  Administered 2014-01-26 (×4): 10 mg via INTRAVENOUS

## 2014-01-26 MED ORDER — PROMETHAZINE HCL 25 MG/ML IJ SOLN: 6.2500 mg | INTRAMUSCULAR | Status: DC | PRN

## 2014-01-26 MED ORDER — SODIUM CHLORIDE 0.9 % IV SOLN: 250.0000 mL | INTRAVENOUS | Status: DC | PRN

## 2014-01-26 MED ORDER — ONDANSETRON HCL 4 MG/2ML IJ SOLN
INTRAMUSCULAR | Status: DC | PRN
  Administered 2014-01-26: 4 mg via INTRAVENOUS

## 2014-01-26 MED ORDER — BUPIVACAINE HCL (PF) 0.25 % IJ SOLN
INTRAMUSCULAR | Status: AC
  Filled 2014-01-26: qty 30

## 2014-01-26 MED ORDER — PROPOFOL 10 MG/ML IV BOLUS
INTRAVENOUS | Status: DC | PRN
Start: 2014-01-26 — End: 2014-01-26
  Administered 2014-01-26: 120 mg via INTRAVENOUS

## 2014-01-26 MED ORDER — RIVAROXABAN 20 MG PO TABS
20.0000 mg | ORAL_TABLET | Freq: Every day | ORAL | Status: DC
  Administered 2014-01-26: 20 mg via ORAL
  Filled 2014-01-26 (×2): qty 1

## 2014-01-26 MED ORDER — HEPARIN SODIUM (PORCINE) 1000 UNIT/ML IJ SOLN
INTRAMUSCULAR | Status: DC | PRN
Start: 2014-01-26 — End: 2014-01-26
  Administered 2014-01-26: 14000 [IU] via INTRAVENOUS
  Administered 2014-01-26: 4000 [IU] via INTRAVENOUS
  Administered 2014-01-26: 5000 [IU] via INTRAVENOUS
  Administered 2014-01-26: 3000 [IU] via INTRAVENOUS

## 2014-01-26 MED ORDER — RIVAROXABAN 20 MG PO TABS
20.0000 mg | ORAL_TABLET | Freq: Every day | ORAL | Status: DC
  Filled 2014-01-26: qty 1

## 2014-01-26 MED ORDER — HEPARIN SODIUM (PORCINE) 1000 UNIT/ML IJ SOLN
INTRAMUSCULAR | Status: AC
  Filled 2014-01-26: qty 1

## 2014-01-26 MED ORDER — SODIUM CHLORIDE 0.9 % IJ SOLN: 3.0000 mL | Freq: Two times a day (BID) | INTRAMUSCULAR | Status: DC

## 2014-01-26 MED ORDER — FENTANYL CITRATE 0.05 MG/ML IJ SOLN
INTRAMUSCULAR | Status: DC | PRN
  Administered 2014-01-26: 100 ug via INTRAVENOUS
  Administered 2014-01-26 (×4): 25 ug via INTRAVENOUS

## 2014-01-26 NOTE — Anesthesia Preprocedure Evaluation (Addendum)
Anesthesia Evaluation  Patient identified by MRN, date of birth, ID band Patient awake    Reviewed: Allergy & Precautions, H&P , NPO status , Patient's Chart, lab work & pertinent test results  History of Anesthesia Complications Negative for: history of anesthetic complications  Airway Mallampati: I TM Distance: >3 FB Neck ROM: Full    Dental  (+) Teeth Intact, Dental Advisory Given, Missing,    Pulmonary sleep apnea and Continuous Positive Airway Pressure Ventilation ,  breath sounds clear to auscultation        Cardiovascular hypertension, Pt. on home beta blockers and Pt. on medications + dysrhythmias Atrial Fibrillation Rhythm:Irregular Rate:Normal  '13 ECHO: EF 35-40%, valves OK '15 ECHO: EF 30%, valves Ok   Neuro/Psych negative neurological ROS     GI/Hepatic negative GI ROS, Neg liver ROS,   Endo/Other  Morbid obesity  Renal/GU negative Renal ROS     Musculoskeletal   Abdominal (+) + obese,   Peds  Hematology negative hematology ROS (+)   Anesthesia Other Findings   Reproductive/Obstetrics                       Anesthesia Physical Anesthesia Plan  ASA: III  Anesthesia Plan: General   Post-op Pain Management:    Induction: Intravenous  Airway Management Planned: LMA  Additional Equipment:   Intra-op Plan:   Post-operative Plan:   Informed Consent: I have reviewed the patients History and Physical, chart, labs and discussed the procedure including the risks, benefits and alternatives for the proposed anesthesia with the patient or authorized representative who has indicated his/her understanding and acceptance.   Dental advisory given  Plan Discussed with: CRNA and Surgeon  Anesthesia Plan Comments: (Plan routine monitors, GA- LMA OK)        Anesthesia Quick Evaluation

## 2014-01-26 NOTE — Progress Notes (Signed)
RT note: pt states he can place himself on CPAP when he is ready for bed. RT informed pt. To notify if he needs any assistance. Pt. States that he wears full face mask at home, RT has pt set with full face mask at 10 cm h20.

## 2014-01-26 NOTE — Anesthesia Procedure Notes (Signed)
Procedure Name: LMA Insertion Date/Time: 01/26/2014 7:56 AM Performed by: Jefm Miles E Pre-anesthesia Checklist: Patient identified, Emergency Drugs available, Suction available, Patient being monitored and Timeout performed Patient Re-evaluated:Patient Re-evaluated prior to inductionOxygen Delivery Method: Circle system utilized Preoxygenation: Pre-oxygenation with 100% oxygen Intubation Type: IV induction LMA: LMA with gastric port inserted LMA Size: 5.0 Number of attempts: 1 Placement Confirmation: positive ETCO2 and breath sounds checked- equal and bilateral Tube secured with: Tape Dental Injury: Teeth and Oropharynx as per pre-operative assessment

## 2014-01-26 NOTE — Discharge Summary (Signed)
ELECTROPHYSIOLOGY DISCHARGE SUMMARY   Patient ID: Maurice Golden,  MRN: [id number], DOB/AGE: [birth year] 53 y.o.  Admit date: 01/26/2014 Discharge date: 01/27/2014  Primary Care Physician: Cain Saupe, MD Primary EP: Hillis Range, MD  Primary Discharge Diagnosis:  Persistent atrial fibrillation s/p comprehensive EP study, AFib ablation and DCCV  Secondary Discharge Diagnoses:  Bradycardia Obesity Obstructive sleep apnea  Procedures This Admission:  1. Comprehensive electrophysiologic study.  2. Coronary sinus pacing and recording.  3. Three-dimensional mapping of atrial fibrillation (with additional mapping and ablation of a second discrete focus- CAFEs)  4. Ablation of atrial fibrillation (with additional mapping and ablation of a second discrete focus- CAFEs)  5. Intracardiac echocardiography.  6. Transseptal puncture of an intact septum.  7. Rotational Angiography with processing at an independent workstation  8. Arrhythmia induction with pacing  9.External cardioversion. CONCLUSIONS:  1. Atrial fibrillation upon presentation.  2. Rotational Angiography reveals a moderate sized left atrium with four separate pulmonary veins with a short common ostium to the left pulmonary veins  3. Successful electrical isolation and anatomical encircling of all four pulmonary veins with radiofrequency current using a WACA approach.  3. CFAEs were identified and ablated along the lateral wall of the left atrium and above the coronary sinus.  4. Atrial fibrillation successfully cardioverted to sinus rhythm.  5. No early apparent complications.  History and Hospital Course:  Maurice Golden is a 53 year old man with persistent atrial fibrillation who reports initially being diagnosed with atrial fibrillation after presenting with palpitations and fatgiue. He has failed medical therapy with Multaq and calcium channel blockers. He has developed a tachycardia-mediated cardiomyopathy. After discussion  regarding treatment options he elected to proceed with AFib ablation. Therefore he presented yesterday and underwent catheter ablation of atrial fibrillation (of note, he underwent pre-procedure TEE with Dr. Rennis Golden on 01/25/2014 which was negative for LA/LAA thrombus). He was also externally cardioverted successfully to SR while in the EP lab. [name] tolerated this procedure well without any immediate complication. He remains hemodynamically stable and afebrile. His groin site is intact without significant bleeding or hematoma. He has been monitored on telemetry which revealed sinus rhythm . He has been given discharge instructions including wound care and activity restrictions. He will follow-up in clinic in 12 weeks. He has been seen, examined and deemed stable for discharge today by Dr. Johney Frame .   Discharge Vitals: Blood pressure 152/67, pulse 73, temperature 98 F (36.7 C), temperature source Oral, resp. rate 18, height 6\' 3"  (1.905 m), weight 280 lb (127.007 kg), SpO2 100.00%.  Physical Exam: Filed Vitals:   01/26/14 2336 01/27/14 0000 01/27/14 0355 01/27/14 0400  BP: 163/99  152/67   Pulse:      Temp:  98.1 F (36.7 C)  98 F (36.7 C)  TempSrc:  Oral  Oral  Resp:      Height:      Weight:      SpO2:  100%  100%    GEN- The patient is well appearing, alert and oriented x 3 today.   Head- normocephalic, atraumatic Eyes-  Sclera clear, conjunctiva pink Ears- hearing intact Oropharynx- clear Neck- supple, no JVP Lymph- no cervical lymphadenopathy Lungs- Clear to ausculation bilaterally, normal work of breathing Heart- Regular rate and rhythm, no murmurs, rubs or gallops, PMI not laterally displaced GI- soft, NT, ND, + BS Extremities- no clubbing, cyanosis, or edema MS- no significant deformity or atrophy Skin- no rash or lesion Psych- euthymic mood, full affect Neuro- strength and  sensation are intact   Labs: Lab Results  Component Value Date   WBC 8.8 01/19/2014   HGB  12.9* 01/19/2014   HCT 39.1 01/19/2014   MCV 85.4 01/19/2014   PLT 244.0 01/19/2014      Component Value Date/Time   NA 140 01/27/2014 0400   K 3.8 01/27/2014 0400   CL 103 01/27/2014 0400   CO2 25 01/27/2014 0400   GLUCOSE 99 01/27/2014 0400   BUN 16 01/27/2014 0400   CREATININE 0.95 01/27/2014 0400   CALCIUM 8.9 01/27/2014 0400   GFRNONAA >90 01/27/2014 0400   GFRAA >90 01/27/2014 0400    Disposition:  The patient is being discharged in stable condition.  Follow-up:     Follow-up Information   [facility] [address] [facility] On 03/07/2014. (At 8:30 AM for follow-up with Rick DuffBrooke Edmisten, PA-C)    Specialty:  Cardiology   Contact information:   [address], [address] [place] [address] [phone]      Follow up with Hillis RangeJames Emersen Mascari, MD In 3 months. (Our office will notify you of your appointment date and time)    Specialty:  Cardiology   Contact information:   [address] [place] [address] [phone]      Discharge Medications:    Medication List    STOP taking these medications       diltiazem 120 MG 24 hr capsule  Commonly known as:  CARDIZEM CD      TAKE these medications       amLODipine 5 MG tablet  Commonly known as:  NORVASC  Take 5 mg by mouth daily.     metoprolol succinate 50 MG 24 hr tablet  Commonly known as:  TOPROL-XL  Take 100 mg by mouth daily. Take with or immediately following a meal.     pantoprazole 40 MG tablet  Commonly known as:  PROTONIX  Take 1 tablet (40 mg total) by mouth daily.     rivaroxaban 20 MG Tabs tablet  Commonly known as:  XARELTO  Take 20 mg by mouth daily with supper.       Duration of Discharge Encounter: Greater than 30 minutes including physician time.  Signed, Hillis RangeJames Eryx Zane, MD  01/27/2014, 7:36 AM

## 2014-01-26 NOTE — Progress Notes (Signed)
Utilization Review Completed.Alando Colleran T Dowell4/16/2015  

## 2014-01-26 NOTE — Transfer of Care (Signed)
Immediate Anesthesia Transfer of Care Note  Patient: Maurice Golden  Procedure(s) Performed: Procedure(s): ATRIAL FIBRILLATION ABLATION ([name])  Patient Location: Cath Lab  Anesthesia Type:General  Level of Consciousness: awake, alert  and oriented  Airway & Oxygen Therapy: Patient Spontanous Breathing and Patient connected to face mask oxygen  Post-op Assessment: Report given to PACU RN  Post vital signs: Reviewed and stable  Complications: No apparent anesthesia complications

## 2014-01-26 NOTE — H&P (Signed)
PCP: Cain Saupe, MD   The patient presents today for ablation. He was cardioverted 12/21/13. He reports having "much more energy" post cardioversion but has unfortunately already returned to afib. V rates are once again elevated. Today, he denies symptoms of palpitations, chest pain, shortness of breath, orthopnea, PND, lower extremity edema, dizziness, presyncope, syncope, or neurologic sequela. The patient feels that he is tolerating medications without difficulties and is otherwise without complaint today.   Past Medical History   Diagnosis  Date   .  Paroxysmal atrial fibrillation    .  Obesity    .  ANEMIA    .  BRADYCARDIA    .  OSA (obstructive sleep apnea)    .  Bradycardia    .  Atrial fibrillation with RVR     Past Surgical History   Procedure  Laterality  Date   .  Bilateral knee arthroscopy     .  Appendectomy     .  L eye cataract     .  Tee without cardioversion   04/29/2012     Procedure: TRANSESOPHAGEAL ECHOCARDIOGRAM (TEE); Surgeon: Vesta Mixer, MD; [facility] ENDOSCOPY; Service: Cardiovascular; Laterality: [name]; changed from nish to nahser/dl   .  Cardioversion   04/29/2012     Procedure: CARDIOVERSION; Surgeon: Vesta Mixer, MD; Location: [facility] ENDOSCOPY; Service: Cardiovascular; Laterality: [name];   .  Cardioversion  [name]  12/21/2013     Procedure: CARDIOVERSION; Surgeon: Wendall Stade, MD; Location: [facility] ENDOSCOPY; Service: Cardiovascular; Laterality: [name];    Current Outpatient Prescriptions   Medication  Sig  Dispense  Refill   .  amLODipine (NORVASC) 5 MG tablet  Take 1 tablet by mouth daily.     .  metoprolol succinate (TOPROL-XL) 50 MG 24 hr tablet  Take 2 tablets (100 mg total) by mouth daily. Take with or immediately following a meal.  180 tablet  3   .  Rivaroxaban (XARELTO) 20 MG TABS tablet  TAKE 1 TABLET BY MOUTH EVERY DAY     .  diltiazem (CARDIZEM CD) 120 MG 24 hr capsule  Take 1 capsule (120 mg total) by mouth daily.  90 capsule  3    No current  facility-administered medications for this visit.   No Known Allergies  History    Social History   .  Marital Status:  Single     Spouse Name:  [name]     Number of Children:  [name]   .  Years of Education:  [name]    Occupational History   .  retired     Social History Main Topics   .  Smoking status:  Never Smoker   .  Smokeless tobacco:  Not on file   .  Alcohol Use:  No   .  Drug Use:  No   .  Sexual Activity:  Not on file    Other Topics  Concern   .  Not on file    Social History Narrative    Lives with mother in [place]. Retired/ disabled from [employer].    Family History   Problem  Relation  Age of Onset   .  Diabetes  Mother    .  Hypertension  Mother    .  Hypertension  Father    ROS- All systems are reviewed and are negative except as outlined in the HPI above  Physical Exam:  Filed Vitals:   01/26/14 0617  BP: 152/88  Pulse: 85  Temp:  98.1 F (36.7 C)  Resp: 18   GEN- The patient is well appearing, alert and oriented x 3 today.  Head- normocephalic, atraumatic  Eyes- Sclera clear, conjunctiva pink  Ears- hearing intact  Oropharynx- clear  Neck- supple, no JVP  Lymph- no cervical lymphadenopathy  Lungs- Clear to ausculation bilaterally, normal work of breathing  Heart- tachycardic irregular rhythm, no murmurs, rubs or gallops, PMI not laterally displaced  GI- soft, NT, ND, + BS  Extremities- no clubbing, cyanosis, or edema  MS- no significant deformity or atrophy  Skin- no rash or lesion  Psych- euthymic mood, full affect  Neuro- strength and sensation are intact    Assessment and Plan:  1. The patient has persistent afib. He has failed medical therapy with multaq and metoprolol. He is not a candidate for Ics due to severe LVH and reduced EF.  Risk, benefits, and alternatives to EP study and radiofrequency ablation for afib were also discussed in detail today. These risks include but are not limited to stroke, bleeding, vascular damage, tamponade,  perforation, damage to the esophagus, lungs, and other structures, pulmonary vein stenosis, worsening renal function, and death. The patient understands these risk and wishes to proceed.  We will therefore proceed with catheter ablation at this time.

## 2014-01-26 NOTE — Op Note (Signed)
SURGEON:  Hillis Range, MD  PREPROCEDURE DIAGNOSES: 1. Persistent atrial fibrillation.  POSTPROCEDURE DIAGNOSES: 1. Persistent atrial fibrillation.  PROCEDURES: 1. Comprehensive electrophysiologic study. 2. Coronary sinus pacing and recording. 3. Three-dimensional mapping of atrial fibrillation (with additional mapping and ablation of a second discrete focus- CAFEs) 4. Ablation of atrial fibrillation (with additional mapping and ablation of a second discrete focus- CAFEs) 5. Intracardiac echocardiography. 6. Transseptal puncture of an intact septum. 7. Rotational Angiography with processing at an independent workstation 8. Arrhythmia induction with pacing 9.External cardioversion.  INTRODUCTION:  Maurice Golden is a 54 y.o. male with a history of persistent atrial fibrillation who now presents for EP study and radiofrequency ablation.  The patient reports initially being diagnosed with atrial fibrillation after presenting with symptomatic palpitations and fatgiue.  The patient has failed medical therapy with Multaq and calcium channel blockers.  He has developed a tachycardia mediated cardiomyopathy.  The patient therefore presents today for catheter ablation of atrial fibrillation.  DESCRIPTION OF PROCEDURE:  Informed written consent was obtained, and the patient was brought to the electrophysiology lab in a fasting state.  The patient was adequately sedated with intravenous medications as outlined in the anesthesia report.  The patient's left and right groins were prepped and draped in the usual sterile fashion by the EP lab staff.  Using a percutaneous Seldinger technique, two 7-French and one 11-French hemostasis sheaths were placed into the right common femoral vein.    3 Dimensional Rotational Angiography: A 5 french pigtail catheter was introduced through the right common femoral vein and advanced into the inferior venocava.  3 demential rotational angiography was then performed by power  injection of 100cc of nonionic contrast.  Reprocessing at an independent work station was then performed.   This demonstrated a moderate sized left atrium with 4 separate pulmonary veins which were large in size.  There was a short common ostium the the left superior and inferior pulmonary veins.    A 3 dimensional rendering of the left atrium was then merged using [employer] onto the WellPoint system and registered with intracardiac echo (see below).  The pigtail catheter was then removed.  Catheter Placement:  A 7-French Biosense Webster Decapolar coronary sinus catheter was introduced through the right common femoral vein and advanced into the coronary sinus for recording and pacing from this location.  A 6-French quadripolar Josephson catheter was introduced through the right common femoral vein and advanced into the right ventricle for recording and pacing.  This catheter was then pulled back to the His bundle location.    Initial Measurements: The patient presented to the electrophysiology lab in atrial fibrillation.  His HV interval was 51 msec.     Intracardiac Echocardiography: A 10-French Biosense Webster AcuNav intracardiac echocardiography catheter was introduced through the left common femoral vein and advanced into the right atrium. Intracardiac echocardiography was performed of the left atrium, and a three-dimensional anatomical rendering of the left atrium was performed using CARTO sound technology.  The patient was noted to have a large sized left atrium.  The interatrial septum was very prominent with a PFO present. All 4 pulmonary veins were visualized.  There was a common left ostium.   The pulmonary veins were large in size.  The left atrial appendage was visualized and did not reveal thrombus.   There was no evidence of pulmonary vein stenosis.   Transseptal Puncture: The middle right common femoral vein sheath was exchanged for an 8.5 [place] SL2 transseptal sheath  and transseptal access was achieved in a standard fashion using a Brockenbrough needle under biplane fluoroscopy with intracardiac echocardiography confirmation of the transseptal puncture.  Once transseptal access had been achieved, heparin was administered intravenously and intra- arterially in order to maintain an ACT of greater than 350 seconds throughout the procedure.   3D Mapping and Ablation: The His bundle catheter was removed and in its place a 3.5 mm Edison InternationalBiosense Webster Smart Touch Thermocool ablation catheter was advanced into the right atrium.  The transseptal sheath was pulled back into the IVC over a guidewire.  The ablation catheter was advanced across the transseptal hole using the wire as a guide.  The transseptal sheath was then re-advanced over the guidewire into the left atrium.  A duodecapolar Biosense Webster circular mapping catheter was introduced through the transseptal sheath and positioned over the mouth of all 4 pulmonary veins.  Three-dimensional electroanatomical mapping was performed using CARTO technology.  This demonstrated electrical activity within all four pulmonary veins at baseline. The patient underwent successful sequential electrical isolation and anatomical encircling of all four pulmonary veins using radiofrequency current with a circular mapping catheter as a guide using a WACA approach.  Complex fractionated electrograms (CFAEs) were then identified and ablated along the lateral wall of the left atrium and above the coronary sinus.  There was a relative paucity of CAFEs throughout the left atrium.  Cardioversion: The patient was then cardioverted to sinus rhythm with a single synchronized 360-J biphasic shock with cardioversion electrodes in the anterior-posterior thoracic configuration.   He remained in sinus rhythm thereafter.  Measurements Following Ablation: In sinus rhythm the RR interval was 1033 msec, with PR 239 msec, QRS 99 msec, and Qtc 465 msec.   Following ablation the AH interval measured 110 msec with an HV interval of 54 msec. Ventricular pacing was performed, which revealed VA dissociation when pacing at a cycle length of 600 msec.  Rapid atrial pacing was performed without inducible arrhythmias.  Electroisolation was then again confirmed in all four pulmonary veins.  Entrance and exit block were achieved.  The procedure was therefore considered completed.  All catheters were removed, and the sheaths were aspirated and flushed.  The patient was transferred to the recovery area for sheath removal per protocol.  A limited bedside transthoracic echocardiogram revealed no pericardial effusion.  There were no early apparent complications.  CONCLUSIONS: 1. Atrial fibrillation upon presentation.   2. Rotational Angiography reveals a moderate sized left atrium with four separate pulmonary veins with a short common ostium to the left pulmonary veins 3. Successful electrical isolation and anatomical encircling of all four pulmonary veins with radiofrequency current using a WACA approach. 3. CFAEs were identified and ablated along  the lateral wall of the left atrium and above the coronary sinus.  4. Atrial fibrillation successfully cardioverted to sinus rhythm. 5. No early apparent complications.   Zakkery Dorian,MD 11:14 AM 01/26/2014

## 2014-01-26 NOTE — Discharge Instructions (Signed)
No driving for 3 days. No lifting over 5 lbs for 1 week. No sexual activity for 1 week. Keep procedure site clean & dry. If you notice increased pain, swelling, bleeding or pus, call/return! You may shower, but no soaking baths/hot tubs/pools for 1 week. ° °

## 2014-01-26 NOTE — Anesthesia Postprocedure Evaluation (Signed)
  Anesthesia Post-op Note  Patient: Maurice Golden  Procedure(s) Performed: Procedure(s): ATRIAL FIBRILLATION ABLATION ([name])  Patient Location: PACU and Cath Lab  Anesthesia Type:General  Level of Consciousness: awake, alert , oriented and patient cooperative  Airway and Oxygen Therapy: Patient Spontanous Breathing and Patient connected to nasal cannula oxygen  Post-op Pain: none  Post-op Assessment: Post-op Vital signs reviewed, Patient's Cardiovascular Status Stable, Respiratory Function Stable, Patent Airway, No signs of Nausea or vomiting and Pain level controlled  Post-op Vital Signs: Reviewed and stable  Last Vitals:  Filed Vitals:   01/26/14 0617  BP: 152/88  Pulse: 85  Temp: 36.7 C  Resp: 18    Complications: No apparent anesthesia complications

## 2014-01-27 ENCOUNTER — Telehealth: Payer: Self-pay | Admitting: Internal Medicine

## 2014-01-27 DIAGNOSIS — Q211 Atrial septal defect: Secondary | ICD-10-CM | POA: Diagnosis not present

## 2014-01-27 DIAGNOSIS — Q2111 Secundum atrial septal defect: Secondary | ICD-10-CM | POA: Diagnosis not present

## 2014-01-27 DIAGNOSIS — I428 Other cardiomyopathies: Secondary | ICD-10-CM | POA: Diagnosis not present

## 2014-01-27 DIAGNOSIS — I4891 Unspecified atrial fibrillation: Secondary | ICD-10-CM | POA: Diagnosis not present

## 2014-01-27 DIAGNOSIS — G4733 Obstructive sleep apnea (adult) (pediatric): Secondary | ICD-10-CM

## 2014-01-27 DIAGNOSIS — I498 Other specified cardiac arrhythmias: Secondary | ICD-10-CM | POA: Diagnosis not present

## 2014-01-27 LAB — BASIC METABOLIC PANEL
BUN: 16 mg/dL (ref 6–23)
CO2: 25 mEq/L (ref 19–32)
Calcium: 8.9 mg/dL (ref 8.4–10.5)
Chloride: 103 mEq/L (ref 96–112)
Creatinine, Ser: 0.95 mg/dL (ref 0.50–1.35)
GFR calc Af Amer: >90 mL/min (ref >90)
GFR calc non Af Amer: >90 mL/min (ref >90)
Glucose, Bld: 99 mg/dL (ref 70–99)
Potassium: 3.8 mEq/L (ref 3.7–5.3)
Sodium: 140 mEq/L (ref 137–147)

## 2014-01-27 MED ORDER — PANTOPRAZOLE SODIUM 40 MG PO TBEC: 40.0000 mg | DELAYED_RELEASE_TABLET | Freq: Every day | ORAL | Status: DC

## 2014-01-27 NOTE — Telephone Encounter (Signed)
Called wanting to clarify if he was to discontinue his Cardiazem. Advised per d/c summary he was to stop the Cardiazem.

## 2014-01-27 NOTE — Telephone Encounter (Signed)
New message    Patient calling back to speak with nurse regarding questions from his procedure he had done.

## 2014-01-27 NOTE — Telephone Encounter (Signed)
New problem   Pt has question about his medication. Please call pt

## 2014-01-27 NOTE — Telephone Encounter (Addendum)
Called patient back.  He is going to need some Xarelto.  I have left samples out front

## 2014-01-30 ENCOUNTER — Encounter (HOSPITAL_COMMUNITY): Payer: Self-pay | Admitting: Emergency Medicine

## 2014-01-30 ENCOUNTER — Telehealth: Payer: Self-pay | Admitting: Internal Medicine

## 2014-01-30 DIAGNOSIS — Z79899 Other long term (current) drug therapy: Secondary | ICD-10-CM | POA: Insufficient documentation

## 2014-01-30 DIAGNOSIS — I10 Essential (primary) hypertension: Secondary | ICD-10-CM | POA: Diagnosis not present

## 2014-01-30 DIAGNOSIS — Z9889 Other specified postprocedural states: Secondary | ICD-10-CM | POA: Diagnosis not present

## 2014-01-30 DIAGNOSIS — I4891 Unspecified atrial fibrillation: Secondary | ICD-10-CM | POA: Diagnosis not present

## 2014-01-30 DIAGNOSIS — Z8669 Personal history of other diseases of the nervous system and sense organs: Secondary | ICD-10-CM | POA: Insufficient documentation

## 2014-01-30 DIAGNOSIS — Z7901 Long term (current) use of anticoagulants: Secondary | ICD-10-CM | POA: Insufficient documentation

## 2014-01-30 DIAGNOSIS — Z862 Personal history of diseases of the blood and blood-forming organs and certain disorders involving the immune mechanism: Secondary | ICD-10-CM | POA: Insufficient documentation

## 2014-01-30 DIAGNOSIS — R51 Headache: Secondary | ICD-10-CM | POA: Insufficient documentation

## 2014-01-30 DIAGNOSIS — E669 Obesity, unspecified: Secondary | ICD-10-CM | POA: Insufficient documentation

## 2014-01-30 NOTE — ED Notes (Signed)
Pt states he had an ablation last week and has been having headaches since Friday.  Pt states he has been taking motrin OTC at home and tylenol with little relief.

## 2014-01-30 NOTE — Telephone Encounter (Signed)
New problem    Pt needs a call back pt is having headaches and thinks it maybe his medication.   Please give pt a call.

## 2014-01-30 NOTE — Telephone Encounter (Signed)
Called pt about his complaint of having a headache when he takes his medications. Pt states that since his procedures last week, every time he takes his Amlodipine, and his Metoprolol, pt is experiencing a bad headache, rated on a scale of 5 out of 10. Pt states he is very concerned about being on Amlodipine and Metoprolol at the same time. Told PT that a common side effect from Amlodipine is a HA.  Pt states "it makes absolutely no sense to me that I have to be on both of these meds, when they do the exact same thing."  Pt would like clarification from Dr. Johney Frame on why he's taking the meds he's taking and would also like to know why every time he takes his Amlodipine and Metoprolol, he gets a horrible HA. Asked pt if he is taking any med to relieve the HA, and pt stated he is taking Ibuprofen. Advised pt to stop taking any NSAIDS, Aleve, Ibuprofen, and asprin while taking current med Xarelto, until further advised from his M.D. Explained to pt those meds could increase his risk of bleeding if taken while on Xarelto. Advised pt that its safe to take Tylenol for the HA. Also asked pt if he has a BP machine at home and has he taken it. Pt states yes he does, and no he has not taken it. Advised pt to take his BP every day and log it on paper and call back on Friday to the nurse to report readings. Pt verbalized understanding. Told pt that Dr. Johney Frame and his nurse are both out of the office today and that I would forward his concerns to both of them to follow-up with concerns. Pt has no other complaints. Pt states he's in no acute distress. Pt verbalized understanding of all instructions given.

## 2014-01-30 NOTE — Telephone Encounter (Signed)
Maurice Golden, Please call patient to follow-up.  Have him come in for a nursing visit with ekg and blood pressure check.

## 2014-01-31 ENCOUNTER — Encounter (HOSPITAL_COMMUNITY): Payer: Self-pay

## 2014-01-31 ENCOUNTER — Emergency Department (HOSPITAL_COMMUNITY)
Admission: EM | Admit: 2014-01-31 | Discharge: 2014-01-31 | Disposition: A | Discharge status: Home / Self Care | Payer: Medicare Other | Attending: Emergency Medicine | Admitting: Emergency Medicine

## 2014-01-31 ENCOUNTER — Emergency Department (HOSPITAL_COMMUNITY): Payer: Medicare Other

## 2014-01-31 DIAGNOSIS — R51 Headache: Secondary | ICD-10-CM

## 2014-01-31 DIAGNOSIS — R519 Headache, unspecified: Secondary | ICD-10-CM

## 2014-01-31 MED ORDER — ONDANSETRON 4 MG PO TBDP
4.0000 mg | ORAL_TABLET | Freq: Once | ORAL | Status: AC
  Administered 2014-01-31: 4 mg via ORAL
  Filled 2014-01-31: qty 1

## 2014-01-31 MED ORDER — HYDROCODONE-ACETAMINOPHEN 5-325 MG PO TABS: 2.0000 | ORAL_TABLET | ORAL | Status: DC | PRN

## 2014-01-31 MED ORDER — HYDROCODONE-ACETAMINOPHEN 5-325 MG PO TABS
2.0000 | ORAL_TABLET | Freq: Once | ORAL | Status: AC
  Administered 2014-01-31: 2 via ORAL
  Filled 2014-01-31: qty 2

## 2014-01-31 NOTE — ED Notes (Signed)
Pt has ride home with significant other.

## 2014-01-31 NOTE — Discharge Instructions (Signed)
Continue to record your blood pressures. Vicodin as needed for pain. Chest or any additional symptoms  Headaches, Frequently Asked Questions MIGRAINE HEADACHES Q: What is migraine? What causes it? How can I treat it? A: Generally, migraine headaches begin as a dull ache. Then they develop into a constant, throbbing, and pulsating pain. You may experience pain at the temples. You may experience pain at the front or back of one or both sides of the head. The pain is usually accompanied by a combination of:  Nausea.  Vomiting.  Sensitivity to light and noise. Some people (about 15%) experience an aura (see below) before an attack. The cause of migraine is believed to be chemical reactions in the brain. Treatment for migraine may include over-the-counter or prescription medications. It may also include self-help techniques. These include relaxation training and biofeedback.  Q: What is an aura? A: About 15% of people with migraine get an "aura". This is a sign of neurological symptoms that occur before a migraine headache. You may see wavy or jagged lines, dots, or flashing lights. You might experience tunnel vision or blind spots in one or both eyes. The aura can include visual or auditory hallucinations (something imagined). It may include disruptions in smell (such as strange odors), taste or touch. Other symptoms include:  Numbness.  A "pins and needles" sensation.  Difficulty in recalling or speaking the correct word. These neurological events may last as long as 60 minutes. These symptoms will fade as the headache begins. Q: What is a trigger? A: Certain physical or environmental factors can lead to or "trigger" a migraine. These include:  Foods.  Hormonal changes.  Weather.  Stress. It is important to remember that triggers are different for everyone. To help prevent migraine attacks, you need to figure out which triggers affect you. Keep a headache diary. This is a good way to  track triggers. The diary will help you talk to your healthcare professional about your condition. Q: Does weather affect migraines? A: Bright sunshine, hot, humid conditions, and drastic changes in barometric pressure may lead to, or "trigger," a migraine attack in some people. But studies have shown that weather does not act as a trigger for everyone with migraines. Q: What is the link between migraine and hormones? A: Hormones start and regulate many of your body's functions. Hormones keep your body in balance within a constantly changing environment. The levels of hormones in your body are unbalanced at times. Examples are during menstruation, pregnancy, or menopause. That can lead to a migraine attack. In fact, about three quarters of all women with migraine report that their attacks are related to the menstrual cycle.  Q: Is there an increased risk of stroke for migraine sufferers? A: The likelihood of a migraine attack causing a stroke is very remote. That is not to say that migraine sufferers cannot have a stroke associated with their migraines. In persons under age 54, the most common associated factor for stroke is migraine headache. But over the course of a person's normal life span, the occurrence of migraine headache may actually be associated with a reduced risk of dying from cerebrovascular disease due to stroke.  Q: What are acute medications for migraine? A: Acute medications are used to treat the pain of the headache after it has started. Examples over-the-counter medications, NSAIDs, ergots, and triptans.  Q: What are the triptans? A: Triptans are the newest class of abortive medications. They are specifically targeted to treat migraine. Triptans are vasoconstrictors. They  moderate some chemical reactions in the brain. The triptans work on receptors in your brain. Triptans help to restore the balance of a neurotransmitter called serotonin. Fluctuations in levels of serotonin are thought  to be a main cause of migraine.  Q: Are over-the-counter medications for migraine effective? A: Over-the-counter, or "OTC," medications may be effective in relieving mild to moderate pain and associated symptoms of migraine. But you should see your caregiver before beginning any treatment regimen for migraine.  Q: What are preventive medications for migraine? A: Preventive medications for migraine are sometimes referred to as "prophylactic" treatments. They are used to reduce the frequency, severity, and length of migraine attacks. Examples of preventive medications include antiepileptic medications, antidepressants, beta-blockers, calcium channel blockers, and NSAIDs (nonsteroidal anti-inflammatory drugs). Q: Why are anticonvulsants used to treat migraine? A: During the past few years, there has been an increased interest in antiepileptic drugs for the prevention of migraine. They are sometimes referred to as "anticonvulsants". Both epilepsy and migraine may be caused by similar reactions in the brain.  Q: Why are antidepressants used to treat migraine? A: Antidepressants are typically used to treat people with depression. They may reduce migraine frequency by regulating chemical levels, such as serotonin, in the brain.  Q: What alternative therapies are used to treat migraine? A: The term "alternative therapies" is often used to describe treatments considered outside the scope of conventional Western medicine. Examples of alternative therapy include acupuncture, acupressure, and yoga. Another common alternative treatment is herbal therapy. Some herbs are believed to relieve headache pain. Always discuss alternative therapies with your caregiver before proceeding. Some herbal products contain arsenic and other toxins. TENSION HEADACHES Q: What is a tension-type headache? What causes it? How can I treat it? A: Tension-type headaches occur randomly. They are often the result of temporary stress, anxiety,  fatigue, or anger. Symptoms include soreness in your temples, a tightening band-like sensation around your head (a "vice-like" ache). Symptoms can also include a pulling feeling, pressure sensations, and contracting head and neck muscles. The headache begins in your forehead, temples, or the back of your head and neck. Treatment for tension-type headache may include over-the-counter or prescription medications. Treatment may also include self-help techniques such as relaxation training and biofeedback. CLUSTER HEADACHES Q: What is a cluster headache? What causes it? How can I treat it? A: Cluster headache gets its name because the attacks come in groups. The pain arrives with little, if any, warning. It is usually on one side of the head. A tearing or bloodshot eye and a runny nose on the same side of the headache may also accompany the pain. Cluster headaches are believed to be caused by chemical reactions in the brain. They have been described as the most severe and intense of any headache type. Treatment for cluster headache includes prescription medication and oxygen. SINUS HEADACHES Q: What is a sinus headache? What causes it? How can I treat it? A: When a cavity in the bones of the face and skull (a sinus) becomes inflamed, the inflammation will cause localized pain. This condition is usually the result of an allergic reaction, a tumor, or an infection. If your headache is caused by a sinus blockage, such as an infection, you will probably have a fever. An x-ray will confirm a sinus blockage. Your caregiver's treatment might include antibiotics for the infection, as well as antihistamines or decongestants.  REBOUND HEADACHES Q: What is a rebound headache? What causes it? How can I treat it? A: A  pattern of taking acute headache medications too often can lead to a condition known as "rebound headache." A pattern of taking too much headache medication includes taking it more than 2 days per week or in  excessive amounts. That means more than the label or a caregiver advises. With rebound headaches, your medications not only stop relieving pain, they actually begin to cause headaches. Doctors treat rebound headache by tapering the medication that is being overused. Sometimes your caregiver will gradually substitute a different type of treatment or medication. Stopping may be a challenge. Regularly overusing a medication increases the potential for serious side effects. Consult a caregiver if you regularly use headache medications more than 2 days per week or more than the label advises. ADDITIONAL QUESTIONS AND ANSWERS Q: What is biofeedback? A: Biofeedback is a self-help treatment. Biofeedback uses special equipment to monitor your body's involuntary physical responses. Biofeedback monitors:  Breathing.  Pulse.  Heart rate.  Temperature.  Muscle tension.  Brain activity. Biofeedback helps you refine and perfect your relaxation exercises. You learn to control the physical responses that are related to stress. Once the technique has been mastered, you do not need the equipment any more. Q: Are headaches hereditary? A: Four out of five (80%) of people that suffer report a family history of migraine. Scientists are not sure if this is genetic or a family predisposition. Despite the uncertainty, a child has a 50% chance of having migraine if one parent suffers. The child has a 75% chance if both parents suffer.  Q: Can children get headaches? A: By the time they reach high school, most young people have experienced some type of headache. Many safe and effective approaches or medications can prevent a headache from occurring or stop it after it has begun.  Q: What type of doctor should I see to diagnose and treat my headache? A: Start with your primary caregiver. Discuss his or her experience and approach to headaches. Discuss methods of classification, diagnosis, and treatment. Your caregiver may  decide to recommend you to a headache specialist, depending upon your symptoms or other physical conditions. Having diabetes, allergies, etc., may require a more comprehensive and inclusive approach to your headache. The National [employer] will provide, upon request, a list of [facility] physician members in your state. Document Released: 12/20/2003 Document Revised: 12/22/2011 Document Reviewed: 05/29/2008 [facility] Patient Information 2014 [place].

## 2014-01-31 NOTE — ED Provider Notes (Signed)
CSN: [id number]     Arrival date & time 01/30/14  2149 History   First MD Initiated Contact with Patient 01/31/14 0017     Chief Complaint  Patient presents with  . Headache      HPI  Patient presents with a headache. Throbbing left-sided headache. On slowly on Friday. Last Thursday he underwent an ablation for atrial fibrillation. He did not take his medications on Wednesday night 5 days ago. On Thursday had a successful ablation and started his medicines again at night. These include amlodipine, metoprolol, and Xarelto. He developed a headache Friday after being discharged. Only new medicine as pantoprazole. Throbbing left-sided headache. No other symptoms today he states he had some wavy vision in his left eye for about 10 minutes and then resolved. His vision is normal now. No scotoma but is not light sensitive. He is not sound sensitive. No neck pain. No fevers. No numbness weakness tingling or extremity symptoms.  Past Medical History  Diagnosis Date  . Paroxysmal atrial fibrillation   . Obesity   . ANEMIA   . BRADYCARDIA   . OSA (obstructive sleep apnea)   . Bradycardia   . Hypertension    Past Surgical History  Procedure Laterality Date  . Bilateral knee arthroscopy    . Appendectomy    . L eye cataract    . Tee without cardioversion  04/29/2012    Procedure: TRANSESOPHAGEAL ECHOCARDIOGRAM (TEE);  Surgeon: Vesta MixerPhilip J Nahser, MD;  [facility];  Service: Cardiovascular;  Laterality: [name];  changed from nish to nahser/dl  . Cardioversion  04/29/2012    Procedure: CARDIOVERSION;  Surgeon: Vesta MixerPhilip J Nahser, MD;  [facility];  Service: Cardiovascular;  Laterality: [name];  . Cardioversion [name] 12/21/2013    Procedure: CARDIOVERSION;  Surgeon: Wendall StadePeter C Nishan, MD;  [facility];  Service: Cardiovascular;  Laterality: [name];  . Tee without cardioversion [name] 01/25/2014    Procedure: TRANSESOPHAGEAL ECHOCARDIOGRAM (TEE);  Surgeon: Chrystie NoseKenneth C. Hilty, MD;  [facility];  Service: Cardiovascular;  Laterality: [name];  . Ablation  01/26/14    PVI by Dr Johney FrameAllred   Family History  Problem Relation Age of Onset  . Diabetes Mother   . Hypertension Mother   . Hypertension Father    History  Substance Use Topics  . Smoking status: Never Smoker   . Smokeless tobacco: Not on file  . Alcohol Use: No    Review of Systems  Constitutional: Negative for fever, chills, diaphoresis, appetite change and fatigue.  HENT: Negative for mouth sores, sore throat and trouble swallowing.   Eyes: Negative for visual disturbance.  Respiratory: Negative for cough, chest tightness, shortness of breath and wheezing.   Cardiovascular: Negative for chest pain.  Gastrointestinal: Negative for nausea, vomiting, abdominal pain, diarrhea and abdominal distention.  Endocrine: Negative for polydipsia, polyphagia and polyuria.  Genitourinary: Negative for dysuria, frequency and hematuria.  Musculoskeletal: Negative for gait problem.  Skin: Negative for color change, pallor and rash.  Neurological: Positive for headaches. Negative for dizziness, syncope and light-headedness.  Hematological: Does not bruise/bleed easily.  Psychiatric/Behavioral: Negative for behavioral problems and confusion.      Allergies  Review of patient's allergies indicates no known allergies.  Home Medications   Prior to Admission medications   Medication Sig Start Date End Date Taking? Authorizing Provider  amLODipine (NORVASC) 5 MG tablet Take 5 mg by mouth daily.  07/20/13   Historical Provider, MD  metoprolol succinate (TOPROL-XL) 50 MG 24 hr tablet Take 100  mg by mouth daily. Take with or immediately following a meal. 12/02/13   Hillis Range, MD  pantoprazole (PROTONIX) 40 MG tablet Take 1 tablet (40 mg total) by mouth daily. 01/27/14   Hillis Range, MD  Rivaroxaban (XARELTO) 20 MG TABS tablet Take 20 mg by mouth daily with supper.    Historical Provider, MD   BP 160/88  Pulse 60  Temp(Src)  98.5 F (36.9 C) (Oral)  Resp 18  Ht 6\' 3"  (1.905 m)  Wt 280 lb (127.007 kg)  BMI 35.00 kg/m2  SpO2 98% Physical Exam  Constitutional: He is oriented to person, place, and time. He appears well-developed and well-nourished. No distress.  HENT:  Head: Normocephalic.  Eyes: Conjunctivae are normal. Pupils are equal, round, and reactive to light. No scleral icterus.  Neck: Normal range of motion. Neck supple. No thyromegaly present.  Cardiovascular: Normal rate and regular rhythm.  Exam reveals no gallop and no friction rub.   No murmur heard. Regular rate and rhythm. Sinus on the monitor.  Pulmonary/Chest: Effort normal and breath sounds normal. No respiratory distress. He has no wheezes. He has no rales.  Abdominal: Soft. Bowel sounds are normal. He exhibits no distension. There is no tenderness. There is no rebound.  Musculoskeletal: Normal range of motion.  Neurological: He is alert and oriented to person, place, and time.  Cranial nerves. Normal peripheral neurological examination. Awake alert oriented lucid.  Skin: Skin is warm and dry. No rash noted.  Psychiatric: He has a normal mood and affect. His behavior is normal.    ED Course  Procedures (including critical care time) Labs Review Labs Reviewed - No data to display  Imaging Review Ct Head Wo Contrast  01/31/2014   CLINICAL DATA:  Headache for 4 days.  Vertigo.  Hypertension.  EXAM: CT HEAD WITHOUT CONTRAST  TECHNIQUE: Contiguous axial images were obtained from the base of the skull through the vertex without intravenous contrast.  COMPARISON:  None.  FINDINGS: Ventricles and sulci appear symmetrical. No mass effect or midline shift. No abnormal extra-axial fluid collections. Gray-white matter junctions are distinct. Basal cisterns are not effaced. No evidence of acute intracranial hemorrhage. No depressed skull fractures. Mucosal thickening in the paranasal sinuses.  IMPRESSION: No acute intracranial abnormalities. Mucosal  thickening in the paranasal sinuses.   Electronically Signed   By: Burman Nieves M.D.   On: 01/31/2014 01:05     EKG Interpretation None      MDM   Final diagnoses:  Headache    To 160 systolic initially. Just sitting he is now 142. Plan will be some by mouth pain medications and CT scan considering he is anticoagulated. Symptoms are consistent with migraine headache.  01:31:  Headache slightly improved. BP 152/94. Normal CT. His only new medication is peptic result. Small percentage report headaches. Asked him to stop to pantoprazole. Vicodin today and tomorrow as needed. Follow his blood pressures.    Rolland Porter, MD 01/31/14 [phone]

## 2014-01-31 NOTE — ED Notes (Signed)
Patient was here for ablation on Thursday, patient reports they did not give him his BP medications while he was here, and is concerned that his BP is elevated and is going to have an aneurysm d/t high BP

## 2014-02-01 NOTE — Addendum Note (Signed)
Addendum created 02/01/14 0919 by Germaine Pomfret, MD   Modules edited: Anesthesia Attestations

## 2014-02-01 NOTE — Telephone Encounter (Signed)
Called and spoke with patient ----  No headaches now, and went to the ER and was given Vicodin and told to stop Protonix  Has not had a headache since

## 2014-02-03 DIAGNOSIS — R21 Rash and other nonspecific skin eruption: Secondary | ICD-10-CM | POA: Diagnosis not present

## 2014-02-24 ENCOUNTER — Telehealth: Payer: Self-pay | Admitting: Internal Medicine

## 2014-02-24 NOTE — Telephone Encounter (Signed)
New problem     Pt asked for a call back. Pt would not give the reason for the call back

## 2014-02-24 NOTE — Telephone Encounter (Signed)
Will move to 03/22/14 so he can see Dr Johney Frame

## 2014-03-07 ENCOUNTER — Encounter: Payer: Medicare Other | Admitting: Cardiology

## 2014-03-14 ENCOUNTER — Encounter: Payer: Medicare Other | Admitting: Cardiology

## 2014-03-15 DIAGNOSIS — M1710 Unilateral primary osteoarthritis, unspecified knee: Secondary | ICD-10-CM | POA: Diagnosis not present

## 2014-03-20 ENCOUNTER — Other Ambulatory Visit: Payer: Self-pay

## 2014-03-21 ENCOUNTER — Telehealth: Payer: Self-pay

## 2014-03-21 ENCOUNTER — Telehealth: Payer: Self-pay | Admitting: Internal Medicine

## 2014-03-21 NOTE — Telephone Encounter (Signed)
New message    Patient did not disclose much information stating he need to discuss with the nurse regarding medication.    Patient stated he is not out of medication.

## 2014-03-21 NOTE — Telephone Encounter (Signed)
[facility] called to let us know that the patient is buying only one pill a month of xarelto and he has not bought one since 12/08/13

## 2014-03-21 NOTE — Telephone Encounter (Signed)
Patient stated that he ran out of xarelto but that the pharmacy already took care of it. They are providing him with a couple doses. He has an appointment tomorrow with Dr. Johney Frame.

## 2014-03-22 ENCOUNTER — Encounter: Payer: Self-pay | Admitting: Internal Medicine

## 2014-03-22 ENCOUNTER — Ambulatory Visit (INDEPENDENT_AMBULATORY_CARE_PROVIDER_SITE_OTHER): Payer: Medicare Other | Admitting: Internal Medicine

## 2014-03-22 VITALS — BP 138/90 | HR 122 | Ht 75.0 in | Wt 286.0 lb

## 2014-03-22 DIAGNOSIS — I10 Essential (primary) hypertension: Secondary | ICD-10-CM | POA: Diagnosis not present

## 2014-03-22 DIAGNOSIS — G4733 Obstructive sleep apnea (adult) (pediatric): Secondary | ICD-10-CM

## 2014-03-22 DIAGNOSIS — I4891 Unspecified atrial fibrillation: Secondary | ICD-10-CM

## 2014-03-22 MED ORDER — AMIODARONE HCL 200 MG PO TABS: 200.0000 mg | ORAL_TABLET | Freq: Two times a day (BID) | ORAL | Status: DC

## 2014-03-22 NOTE — Patient Instructions (Signed)
Your physician recommends that you schedule a follow-up appointment in: 4 weeks in nurse room for EKG  Your physician has recommended you make the following change in your medication:  1) Start Amiodarone 200mg  twice daily

## 2014-03-23 NOTE — Progress Notes (Signed)
PCP:  Cain Saupe, MD  The patient presents today for electrophysiology followup.  He has done well since his recent afib ablation.  Unfortunately already returned to afib (ERAF).  V rates are once again elevated. Today, he denies symptoms of palpitations, chest pain, shortness of breath, orthopnea, PND, lower extremity edema, dizziness, presyncope, syncope, or neurologic sequela.  The patient feels that he is tolerating medications without difficulties and is otherwise without complaint today.   Past Medical History  Diagnosis Date  . Paroxysmal atrial fibrillation   . Obesity   . ANEMIA   . BRADYCARDIA   . OSA (obstructive sleep apnea)   . Bradycardia   . Hypertension    Past Surgical History  Procedure Laterality Date  . Bilateral knee arthroscopy    . Appendectomy    . L eye cataract    . Tee without cardioversion  04/29/2012    Procedure: TRANSESOPHAGEAL ECHOCARDIOGRAM (TEE);  Surgeon: Vesta Mixer, MD;  Location: [facility] ENDOSCOPY;  Service: Cardiovascular;  Laterality: [name];  changed from nish to nahser/dl  . Cardioversion  04/29/2012    Procedure: CARDIOVERSION;  Surgeon: Vesta Mixer, MD;  Location: [facility] ENDOSCOPY;  Service: Cardiovascular;  Laterality: [name];  . Cardioversion [name] 12/21/2013    Procedure: CARDIOVERSION;  Surgeon: Wendall Stade, MD;  Location: [facility] ENDOSCOPY;  Service: Cardiovascular;  Laterality: [name];  . Tee without cardioversion [name] 01/25/2014    Procedure: TRANSESOPHAGEAL ECHOCARDIOGRAM (TEE);  Surgeon: [name], MD;  Location: [facility] ENDOSCOPY;  Service: Cardiovascular;  Laterality: [name];  . Ablation  01/26/14    PVI by Dr Johney Frame    Current Outpatient Prescriptions  Medication Sig Dispense Refill  . amLODipine (NORVASC) 5 MG tablet Take 5 mg by mouth daily.       . metoprolol succinate (TOPROL-XL) 50 MG 24 hr tablet Take 100 mg by mouth daily. Take with or immediately following a meal.      . Rivaroxaban (XARELTO) 20 MG TABS tablet Take 20 mg by mouth  daily with supper.      Marland Kitchen amiodarone (PACERONE) 200 MG tablet Take 1 tablet (200 mg total) by mouth 2 (two) times daily.  60 tablet  3   No current facility-administered medications for this visit.    No Known Allergies  History   Social History  . Marital Status: Single    Spouse Name: [name]    Number of Children: [name]  . Years of Education: [name]   Occupational History  . retired    Social History Main Topics  . Smoking status: Never Smoker   . Smokeless tobacco: Not on file  . Alcohol Use: No  . Drug Use: No  . Sexual Activity: Yes   Other Topics Concern  . Not on file   Social History Narrative   Lives with mother in [place].  Retired/ disabled from [employer].    Family History  Problem Relation Age of Onset  . Diabetes Mother   . Hypertension Mother   . Hypertension Father     ROS-  All systems are reviewed and are negative except as outlined in the HPI above  Physical Exam: Filed Vitals:   03/22/14 1646  BP: 138/90  Pulse: 122  Height: 6\' 3"  (1.905 m)  Weight: 286 lb (129.729 kg)    GEN- The patient is well appearing, alert and oriented x 3 today.   Head- normocephalic, atraumatic Eyes-  Sclera clear, conjunctiva pink Ears- hearing intact Oropharynx- clear Neck- supple, no JVP Lymph-  no cervical lymphadenopathy Lungs- Clear to ausculation bilaterally, normal work of breathing Heart- tachycardic irregular rhythm, no murmurs, rubs or gallops, PMI not laterally displaced GI- soft, NT, ND, + BS Extremities- no clubbing, cyanosis, or edema MS- no significant deformity or atrophy Skin- no rash or lesion Psych- euthymic mood, full affect Neuro- strength and sensation are intact  ekg reveals afib with V rate 122 bpm Echo is reviewed,  LA size is 53 mm  Assessment and Plan:  1.  The patient has ERAF post ablation.  He has failed medical therapy with multaq and metoprolol.  At this point, I think that we should try to achieve/ maintain sinus with  amiodarone.  Risks, benefits, and alternatives to amiodarone were discussed at length with him today.  He understands the risks and wishes to proceed.  We will therefore start amiodarone 200mg  BID today.  He will return in 4 weeks.  If he remains in afib then he will require cardioversion.  The importance of compliance with xarelto was stressed today.  2. htn Stable No change required today  3. osa Compliance with CPAP is encouraged  Return in 4-6 weeks

## 2014-03-28 DIAGNOSIS — Z79899 Other long term (current) drug therapy: Secondary | ICD-10-CM | POA: Diagnosis not present

## 2014-03-28 DIAGNOSIS — R7301 Impaired fasting glucose: Secondary | ICD-10-CM | POA: Diagnosis not present

## 2014-03-28 DIAGNOSIS — I10 Essential (primary) hypertension: Secondary | ICD-10-CM | POA: Diagnosis not present

## 2014-03-28 DIAGNOSIS — Z1211 Encounter for screening for malignant neoplasm of colon: Secondary | ICD-10-CM | POA: Diagnosis not present

## 2014-04-06 ENCOUNTER — Telehealth: Payer: Self-pay | Admitting: *Deleted

## 2014-04-06 ENCOUNTER — Telehealth: Payer: Self-pay | Admitting: Internal Medicine

## 2014-04-06 NOTE — Telephone Encounter (Signed)
Patient would not give me any information. He only wanted to speak with Tresa Endo.

## 2014-04-06 NOTE — Telephone Encounter (Signed)
Called to speak with patient and he says he picked up his samples.  All is good

## 2014-04-06 NOTE — Telephone Encounter (Signed)
Patient requests xarelto samples. I will place at the front desk for pick up. 

## 2014-04-20 ENCOUNTER — Ambulatory Visit (INDEPENDENT_AMBULATORY_CARE_PROVIDER_SITE_OTHER): Payer: Medicare Other | Admitting: *Deleted

## 2014-04-20 VITALS — BP 143/92 | HR 111 | Resp 20 | Ht 75.0 in | Wt 280.0 lb

## 2014-04-20 DIAGNOSIS — I482 Chronic atrial fibrillation, unspecified: Secondary | ICD-10-CM

## 2014-04-20 DIAGNOSIS — I4891 Unspecified atrial fibrillation: Secondary | ICD-10-CM | POA: Diagnosis not present

## 2014-04-20 NOTE — Progress Notes (Signed)
1.) Reason for visit: EKG per Dr. Nance Pew note on 6/10  2.) Name of MD requesting visit: Dr. Johney Frame  H&P: The patient has ERAF post ablation. He has failed medical therapy with multaq and metoprolol. Per Dr Johney Frame last OV the pt is to try to achieve/ maintain sinus with amiodarone. According to 6/10 note pt is to start amiodarone 200mg  BID, and return in 4 weeks (7/9) for a EKG. Per Dr. Johney Frame if pt remains in afib then he will require cardioversion.    3.) ROS related to problem: Pt was here today 7/9 for a EKG to determine if he is still in afib. Pt was instructed and stressed to at last OV with Dr Johney Frame that he is to take all prescribed medications. Pt is taking all of his medications except for the amiodarone 200mg  bid, that was newly prescribed by Dr Johney Frame at last OV 6/10. Pt states he is not taking this medication because "I felt like I didn't need it." "I'm taking the other 3 meds." Pts V/S this visit were 143/92-111 irregular-20.  Pt is asymptomatic with no cardiac complaints. EKG was performed and interpreted by DOD Dr. Myrtis Ser, d/t Dr Johney Frame being out of the office. EKG showed pt remains in afib with a rate of 111. Informed Dr Myrtis Ser that pt is non-compliant with his amiodarone 200 mg BID, and has never even taken it. Per Dr Myrtis Ser (DOD), he strongly advised for pt to start his Amiodarone today and schedule a f/u EKG in 1 month and f/u with Dr. Johney Frame thereafter. Informed and stressed to pt what Dr. Myrtis Ser advised with being compliant and taking his Amiodarone. Provided pt teaching. Pt is scheduled to come in for a nurse visit (EKG) on 8/10 and a f/u OV with Dr Johney Frame on 06/14/14. Pt states "I am going to the pharmacy now to pick my amiodarone up." Pt verbalized understanding and agrees with this plan.   4.) Assessment and plan per MD: Per Dr Myrtis Ser (DOD) this pt is to start taking his Amiodarone 200 mg BID today, as instructed by Dr Johney Frame at last OV. Dr Myrtis Ser advised for pt to schedule a repeat EKG in one  month and a f/u OV with Dr Johney Frame thereafter. Pt verbalized understanding of instructions given and agrees with this plan. Repeat EKG scheduled for 8/10 and f/u OV scheduled for 9/2. Will forward this note to Dr Johney Frame for further review and recommendation.

## 2014-04-21 ENCOUNTER — Ambulatory Visit (INDEPENDENT_AMBULATORY_CARE_PROVIDER_SITE_OTHER): Payer: Medicare Other | Admitting: Pulmonary Disease

## 2014-04-21 ENCOUNTER — Encounter: Payer: Self-pay | Admitting: Pulmonary Disease

## 2014-04-21 VITALS — BP 130/90 | HR 90 | Temp 98.3°F | Ht 75.0 in | Wt 290.0 lb

## 2014-04-21 DIAGNOSIS — G4733 Obstructive sleep apnea (adult) (pediatric): Secondary | ICD-10-CM | POA: Diagnosis not present

## 2014-04-21 NOTE — Progress Notes (Signed)
   Subjective:    Patient ID: Maurice Golden, male    DOB: 07/18/1961, 53 y.o.   MRN: [id number]  HPI Patient comes in today for followup of his obstructive sleep apnea. He is wearing CPAP compliantly, and is not having any significant issues with his mask or pressure. He feels that he sleeps well with the device, and has adequate daytime alertness. He is been working on weight loss, and lost 25 pounds last year, and another 6 this year. I have encouraged him to keep up the good work.    Review of Systems  Constitutional: Negative for fever and unexpected weight change.  HENT: Negative for congestion, dental problem, ear pain, nosebleeds, postnasal drip, rhinorrhea, sinus pressure, sneezing, sore throat and trouble swallowing.   Eyes: Negative for redness and itching.  Respiratory: Negative for cough, chest tightness, shortness of breath and wheezing.   Cardiovascular: Negative for palpitations and leg swelling.  Gastrointestinal: Negative for nausea and vomiting.  Genitourinary: Negative for dysuria.  Musculoskeletal: Negative for joint swelling.  Skin: Negative for rash.  Neurological: Negative for headaches.  Hematological: Does not bruise/bleed easily.  Psychiatric/Behavioral: Negative for dysphoric mood. The patient is not nervous/anxious.        Objective:   Physical Exam Overweight male in no acute distress Nose without purulence or discharge noted No skin breakdown or pressure necrosis from the CPAP mask Neck without lymphadenopathy or thyromegaly Lower extremities with mild edema, no cyanosis Alert and oriented, moves all 4 extremities       Assessment & Plan:

## 2014-04-21 NOTE — Assessment & Plan Note (Signed)
The patient is doing very well with CPAP, and is continuing to slowly decrease his weight. I have asked him to stay on his device, and to keep up with his mask changes and supplies. I've also asked him to keep working on aggressive weight loss.

## 2014-04-21 NOTE — Patient Instructions (Signed)
Stay on cpap, and keep up with mask changes and supplies. Keep working on weight loss.  You are doing well. followup with me again in one year.  

## 2014-04-25 ENCOUNTER — Telehealth: Payer: Self-pay | Admitting: Internal Medicine

## 2014-04-25 NOTE — Telephone Encounter (Signed)
Gave him samples of Xarelto

## 2014-04-25 NOTE — Telephone Encounter (Signed)
New problem   Pt need to speak to you concerning a personal matter, wouldn't say why.

## 2014-05-08 ENCOUNTER — Telehealth: Payer: Self-pay | Admitting: Internal Medicine

## 2014-05-08 NOTE — Telephone Encounter (Signed)
New message     Talk to [facility].  He told me it was personal.

## 2014-05-08 NOTE — Telephone Encounter (Signed)
Samples outfront 

## 2014-05-22 ENCOUNTER — Encounter: Payer: Self-pay | Admitting: Cardiovascular Disease

## 2014-05-22 ENCOUNTER — Ambulatory Visit (INDEPENDENT_AMBULATORY_CARE_PROVIDER_SITE_OTHER): Payer: Medicare Other | Admitting: *Deleted

## 2014-05-22 VITALS — BP 145/90 | HR 77 | Ht 75.0 in | Wt 283.0 lb

## 2014-05-22 DIAGNOSIS — I4891 Unspecified atrial fibrillation: Secondary | ICD-10-CM | POA: Diagnosis not present

## 2014-05-22 DIAGNOSIS — I4819 Other persistent atrial fibrillation: Secondary | ICD-10-CM

## 2014-05-22 NOTE — Progress Notes (Addendum)
Reason for Visit: EKG  Performed EKG and vitals, EKG reviewed by Maurice Golden who states to send a note to Maurice Golden to set up a cardioversion.  Pt stated that he would like to talk to Dr. Johney Golden about why this is not getting any better. He would also like to discuss what his limitations are as far as daily activity because he fears something worse will happen.    Addendum:  Maurice Golden is out this week.  I am also on vacation. Please go ahead and schedule for cardioversion. He has only recently begun taking amiodarone.  Afib is very common early post ablation and should improve during the healing phases.  I would like for him to be cardioverted this week if possible.

## 2014-05-25 ENCOUNTER — Encounter (HOSPITAL_COMMUNITY): Payer: Self-pay | Admitting: Pharmacy Technician

## 2014-05-25 NOTE — Progress Notes (Signed)
I spoke with the pt and made him aware that Dr Johney Frame would like him to have a Cardioversion this week.  The pt cannot do this week and would like to have procedure on 05/30/14.  Pt scheduled for DCCV on 05/30/14 at [facility] with Dr Shirlee Latch. Pre-procedure instructions given to the pt by phone.  I made the pt aware of the importance of taking his medications as prescribed.

## 2014-05-28 ENCOUNTER — Other Ambulatory Visit: Payer: Self-pay | Admitting: *Deleted

## 2014-05-28 DIAGNOSIS — I480 Paroxysmal atrial fibrillation: Secondary | ICD-10-CM

## 2014-05-28 NOTE — Progress Notes (Signed)
Thanks Leonia Corona please follow-up with Mr Renicker.

## 2014-05-29 MED ORDER — SODIUM CHLORIDE 0.9 % IV SOLN
INTRAVENOUS | Status: DC
Start: 2014-05-29 — End: 2014-05-30

## 2014-05-30 ENCOUNTER — Encounter (HOSPITAL_COMMUNITY)
Admission: RE | Disposition: A | Discharge status: Home / Self Care | Payer: Self-pay | Source: Ambulatory Visit | Attending: Cardiology

## 2014-05-30 ENCOUNTER — Ambulatory Visit (HOSPITAL_COMMUNITY): Payer: Medicare Other | Admitting: Anesthesiology

## 2014-05-30 ENCOUNTER — Encounter (HOSPITAL_COMMUNITY): Payer: Medicare Other | Admitting: Anesthesiology

## 2014-05-30 ENCOUNTER — Encounter (HOSPITAL_COMMUNITY): Payer: Self-pay

## 2014-05-30 ENCOUNTER — Ambulatory Visit (HOSPITAL_COMMUNITY)
Admission: RE | Admit: 2014-05-30 | Discharge: 2014-05-30 | Disposition: A | Discharge status: Home / Self Care | Payer: Medicare Other | Source: Ambulatory Visit | Attending: Cardiology | Admitting: Cardiology

## 2014-05-30 DIAGNOSIS — Z6835 Body mass index (BMI) 35.0-35.9, adult: Secondary | ICD-10-CM | POA: Insufficient documentation

## 2014-05-30 DIAGNOSIS — Z885 Allergy status to narcotic agent status: Secondary | ICD-10-CM | POA: Insufficient documentation

## 2014-05-30 DIAGNOSIS — G4730 Sleep apnea, unspecified: Secondary | ICD-10-CM | POA: Diagnosis not present

## 2014-05-30 DIAGNOSIS — Z7901 Long term (current) use of anticoagulants: Secondary | ICD-10-CM | POA: Insufficient documentation

## 2014-05-30 DIAGNOSIS — I10 Essential (primary) hypertension: Secondary | ICD-10-CM | POA: Diagnosis not present

## 2014-05-30 DIAGNOSIS — I4891 Unspecified atrial fibrillation: Secondary | ICD-10-CM | POA: Insufficient documentation

## 2014-05-30 DIAGNOSIS — I480 Paroxysmal atrial fibrillation: Secondary | ICD-10-CM

## 2014-05-30 HISTORY — PX: CARDIOVERSION: SHX1299

## 2014-05-30 SURGERY — CARDIOVERSION
Anesthesia: General

## 2014-05-30 MED ORDER — LACTATED RINGERS IV SOLN: INTRAVENOUS | Status: DC

## 2014-05-30 MED ORDER — LIDOCAINE HCL (CARDIAC) 20 MG/ML IV SOLN
INTRAVENOUS | Status: DC | PRN
  Administered 2014-05-30: 50 mg via INTRAVENOUS

## 2014-05-30 MED ORDER — PROPOFOL 10 MG/ML IV BOLUS
INTRAVENOUS | Status: DC | PRN
  Administered 2014-05-30: 120 mg via INTRAVENOUS

## 2014-05-30 MED ORDER — LACTATED RINGERS IV SOLN
INTRAVENOUS | Status: DC | PRN
  Administered 2014-05-30: 14:00:00 via INTRAVENOUS

## 2014-05-30 NOTE — Anesthesia Preprocedure Evaluation (Addendum)
Anesthesia Evaluation  Patient identified by MRN, date of birth, ID band Patient awake    Reviewed: Allergy & Precautions, H&P , NPO status , Patient's Chart, lab work & pertinent test results  History of Anesthesia Complications Negative for: history of anesthetic complications  Airway Mallampati: I TM Distance: >3 FB Neck ROM: Full    Dental  (+) Teeth Intact, Dental Advisory Given, Missing,    Pulmonary sleep apnea and Continuous Positive Airway Pressure Ventilation ,  breath sounds clear to auscultation        Cardiovascular hypertension, Pt. on home beta blockers and Pt. on medications + dysrhythmias Atrial Fibrillation Rhythm:Irregular Rate:Normal  '13 ECHO: EF 35-40%, valves OK '15 ECHO: EF 30%, valves Ok   Neuro/Psych negative neurological ROS     GI/Hepatic negative GI ROS, Neg liver ROS,   Endo/Other  Morbid obesity  Renal/GU      Musculoskeletal   Abdominal (+) + obese,   Peds  Hematology negative hematology ROS (+)   Anesthesia Other Findings   Reproductive/Obstetrics                          Anesthesia Physical Anesthesia Plan  ASA: III  Anesthesia Plan: General   Post-op Pain Management:    Induction:   Airway Management Planned:   Additional Equipment:   Intra-op Plan:   Post-operative Plan:   Informed Consent: I have reviewed the patients History and Physical, chart, labs and discussed the procedure including the risks, benefits and alternatives for the proposed anesthesia with the patient or authorized representative who has indicated his/her understanding and acceptance.     Plan Discussed with:   Anesthesia Plan Comments:        Anesthesia Quick Evaluation

## 2014-05-30 NOTE — Anesthesia Postprocedure Evaluation (Signed)
  Anesthesia Post-op Note  Patient: Maurice Golden  Procedure(s) Performed: Procedure(s): CARDIOVERSION ([name])  Patient Location: Endoscopy Unit  Anesthesia Type:General  Level of Consciousness: awake, alert  and oriented  Airway and Oxygen Therapy: Patient Spontanous Breathing  Post-op Pain: none  Post-op Assessment: Post-op Vital signs reviewed, Patient's Cardiovascular Status Stable, Respiratory Function Stable and Patent Airway  Post-op Vital Signs: Reviewed and stable  Last Vitals:  Filed Vitals:   05/30/14 1358  BP: 179/130  Pulse:   Temp:   Resp: 16    Complications: No apparent anesthesia complications

## 2014-05-30 NOTE — H&P (View-Only) (Signed)
Thanks Lauren  Kelly please follow-up with [name]. 

## 2014-05-30 NOTE — Transfer of Care (Signed)
Immediate Anesthesia Transfer of Care Note  Patient: Maurice Golden  Procedure(s) Performed: Procedure(s): CARDIOVERSION ([name])  Patient Location: Endoscopy Unit  Anesthesia Type:General  Level of Consciousness: awake, alert  and oriented  Airway & Oxygen Therapy: Patient Spontanous Breathing  Post-op Assessment: Report given to PACU RN  Post vital signs: Reviewed and stable  Complications: No apparent anesthesia complications

## 2014-05-30 NOTE — Procedures (Signed)
Electrical Cardioversion Procedure Note Maurice Golden [id number] [birth year]  Procedure: Electrical Cardioversion Indications:  Atrial Fibrillation.  Patient has been on Xarelto > 1 month without missing a dose.   Procedure Details Consent: Risks of procedure as well as the alternatives and risks of each were explained to the (patient/caregiver).  Consent for procedure obtained. Time Out: Verified patient identification, verified procedure, site/side was marked, verified correct patient position, special equipment/implants available, medications/allergies/relevent history reviewed, required imaging and test results available.  Performed  Patient placed on cardiac monitor, pulse oximetry, supplemental oxygen as necessary.  Sedation given: Propofol 120 mg IV per anesthesiology Pacer pads placed anterior and posterior chest.  Cardioverted 1 time(s).  Cardioverted at 200J.  Evaluation Findings: Post procedure EKG shows: NSR Complications: None Patient did tolerate procedure well.   Maurice Golden 05/30/2014, 2:12 PM

## 2014-05-30 NOTE — Interval H&P Note (Signed)
History and Physical Interval Note:  05/30/2014 2:06 PM  Maurice Golden  has presented today for surgery, with the diagnosis of AFIB  The various methods of treatment have been discussed with the patient and family. After consideration of risks, benefits and other options for treatment, the patient has consented to  Procedure(s): CARDIOVERSION ([name]) as a surgical intervention .  The patient's history has been reviewed, patient examined, no change in status, stable for surgery.  I have reviewed the patient's chart and labs.  Questions were answered to the patient's satisfaction.     Dalton [employer]

## 2014-05-30 NOTE — Discharge Instructions (Addendum)

## 2014-05-31 ENCOUNTER — Encounter (HOSPITAL_COMMUNITY): Payer: Self-pay | Admitting: Cardiology

## 2014-06-07 ENCOUNTER — Telehealth: Payer: Self-pay | Admitting: Internal Medicine

## 2014-06-07 DIAGNOSIS — Z961 Presence of intraocular lens: Secondary | ICD-10-CM | POA: Diagnosis not present

## 2014-06-07 DIAGNOSIS — H04129 Dry eye syndrome of unspecified lacrimal gland: Secondary | ICD-10-CM | POA: Diagnosis not present

## 2014-06-07 NOTE — Telephone Encounter (Signed)
lmom for patient to call back tomorrow 

## 2014-06-07 NOTE — Telephone Encounter (Signed)
New message ° ° °Patient returning call back to nurse.  °

## 2014-06-08 NOTE — Telephone Encounter (Signed)
Follow up  ° ° °Patient calling back to speak with nurse  °

## 2014-06-08 NOTE — Telephone Encounter (Signed)
Xarelto 20mg  out front

## 2014-06-14 ENCOUNTER — Ambulatory Visit (INDEPENDENT_AMBULATORY_CARE_PROVIDER_SITE_OTHER): Payer: Medicare Other | Admitting: Internal Medicine

## 2014-06-14 ENCOUNTER — Encounter: Payer: Self-pay | Admitting: Internal Medicine

## 2014-06-14 VITALS — BP 142/90 | HR 54 | Ht 75.0 in | Wt 292.1 lb

## 2014-06-14 DIAGNOSIS — I4891 Unspecified atrial fibrillation: Secondary | ICD-10-CM

## 2014-06-14 DIAGNOSIS — I10 Essential (primary) hypertension: Secondary | ICD-10-CM | POA: Diagnosis not present

## 2014-06-14 DIAGNOSIS — G4733 Obstructive sleep apnea (adult) (pediatric): Secondary | ICD-10-CM | POA: Diagnosis not present

## 2014-06-14 DIAGNOSIS — I4819 Other persistent atrial fibrillation: Secondary | ICD-10-CM

## 2014-06-14 MED ORDER — AMIODARONE HCL 200 MG PO TABS: 200.0000 mg | ORAL_TABLET | Freq: Every day | ORAL | Status: DC

## 2014-06-14 NOTE — Progress Notes (Signed)
PCP:  Cain Saupe, MD  The patient presents today for electrophysiology followup.  He has done well since his recent afib ablation.  He did have ERAF post ablation but is presently maintaining sinus rhythm with recent cardioversion. Today, he denies symptoms of palpitations, chest pain, shortness of breath, orthopnea, PND, dizziness, presyncope, syncope, or neurologic sequela.  He has noticed very mild swelling with amlodipine.  The patient feels that he is tolerating medications without difficulties and is otherwise without complaint today.   Past Medical History  Diagnosis Date  . Paroxysmal atrial fibrillation   . Obesity   . ANEMIA   . BRADYCARDIA   . OSA (obstructive sleep apnea)   . Bradycardia   . Hypertension    Past Surgical History  Procedure Laterality Date  . Bilateral knee arthroscopy    . Appendectomy    . L eye cataract    . Tee without cardioversion  04/29/2012    Procedure: TRANSESOPHAGEAL ECHOCARDIOGRAM (TEE);  Surgeon: Vesta Mixer, MD;  Location: [facility] ENDOSCOPY;  Service: Cardiovascular;  Laterality: [name];  changed from nish to nahser/dl  . Cardioversion  04/29/2012    Procedure: CARDIOVERSION;  Surgeon: Vesta Mixer, MD;  Location: [facility] ENDOSCOPY;  Service: Cardiovascular;  Laterality: [name];  . Cardioversion [name] 12/21/2013    Procedure: CARDIOVERSION;  Surgeon: Wendall Stade, MD;  Location: [facility] ENDOSCOPY;  Service: Cardiovascular;  Laterality: [name];  . Tee without cardioversion [name] 01/25/2014    Procedure: TRANSESOPHAGEAL ECHOCARDIOGRAM (TEE);  Surgeon: [name], MD;  Location: [facility] ENDOSCOPY;  Service: Cardiovascular;  Laterality: [name];  . Ablation  01/26/14    PVI by Dr Johney Frame  . Cardioversion [name] 05/30/2014    Procedure: CARDIOVERSION;  Surgeon: Laurey Morale, MD;  Location: [facility] ENDOSCOPY;  Service: Cardiovascular;  Laterality: [name];    Current Outpatient Prescriptions  Medication Sig Dispense Refill  . amiodarone (PACERONE) 200 MG tablet Take 1 tablet  (200 mg total) by mouth daily.  90 tablet  3  . amLODipine (NORVASC) 5 MG tablet Take 5 mg by mouth daily.       . metoprolol succinate (TOPROL-XL) 50 MG 24 hr tablet Take 100 mg by mouth daily. Take with or immediately following a meal.      . Rivaroxaban (XARELTO) 20 MG TABS tablet Take 20 mg by mouth daily with supper.       No current facility-administered medications for this visit.    Allergies  Allergen Reactions  . Hydrocodone Itching    History   Social History  . Marital Status: Single    Spouse Name: [name]    Number of Children: [name]  . Years of Education: [name]   Occupational History  . retired    Social History Main Topics  . Smoking status: Never Smoker   . Smokeless tobacco: Not on file  . Alcohol Use: No  . Drug Use: No  . Sexual Activity: Yes   Other Topics Concern  . Not on file   Social History Narrative   Lives with mother in [place].  Retired/ disabled from [employer].    Family History  Problem Relation Age of Onset  . Diabetes Mother   . Hypertension Mother   . Hypertension Father     ROS-  All systems are reviewed and are negative except as outlined in the HPI above  Physical Exam: Filed Vitals:   06/14/14 1646  BP: 142/90  Pulse: 54  Height: 6\' 3"  (1.905 m)  Weight: 292 lb  1.9 oz (132.505 kg)    GEN- The patient is well appearing, alert and oriented x 3 today.   Head- normocephalic, atraumatic Eyes-  Sclera clear, conjunctiva pink Ears- hearing intact Oropharynx- clear Neck- supple  Lungs- Clear to ausculation bilaterally, normal work of breathing Heart- RRR, no murmurs, rubs or gallops, PMI not laterally displaced GI- soft, NT, ND, + BS Extremities- no clubbing, cyanosis, trace edema Neuro- strength and sensation are intact  ekg reveals sinus rhythm 56 bpm, PR 242, incomplete RBB, LAD, Qtc 461  Assessment and Plan:  1.  Persistent AF- The patient has ERAF post ablation.  He is now in sinus rhythm.  Decrease amiodarone to   daily Will need LFTs and TFTs upon return Continue xarelto  2. htn Stable No change required today Low sodium diet is encouraged with recent swelling  3. osa Compliance with CPAP is encouraged  Return in 3 months

## 2014-06-14 NOTE — Patient Instructions (Signed)
Your physician has recommended you make the following change in your medication:  1) DECREASE Amiodarone to 200mg  daily.   Your physician recommends that you schedule a follow-up appointment in: 3 months with Dr. Johney Frame.

## 2014-06-15 ENCOUNTER — Encounter: Payer: Self-pay | Admitting: Internal Medicine

## 2014-07-07 ENCOUNTER — Telehealth: Payer: Self-pay | Admitting: Internal Medicine

## 2014-07-07 NOTE — Telephone Encounter (Signed)
Will leave samples of Xarelto out front

## 2014-07-07 NOTE — Telephone Encounter (Signed)
New message ° ° ° ° ° °Talk to Maurice Golden.  He would not tell me what he wanted °

## 2014-08-08 ENCOUNTER — Telehealth: Payer: Self-pay | Admitting: Internal Medicine

## 2014-08-08 NOTE — Telephone Encounter (Signed)
Patient has questions about medication, please call and advise.  °

## 2014-08-08 NOTE — Telephone Encounter (Signed)
Samples of Xarelto left for patient to pick up. He is aware to come to the [facility].

## 2014-08-25 ENCOUNTER — Telehealth: Payer: Self-pay | Admitting: Internal Medicine

## 2014-08-25 NOTE — Telephone Encounter (Signed)
Left message on machine for patient to come pick up samples of Xarelto

## 2014-08-25 NOTE — Telephone Encounter (Signed)
New message           Pt would like for kelly to give him a call / pt did not disclose any other info

## 2014-09-15 ENCOUNTER — Other Ambulatory Visit: Payer: Self-pay | Admitting: *Deleted

## 2014-09-15 MED ORDER — METOPROLOL SUCCINATE ER 50 MG PO TB24: 100.0000 mg | ORAL_TABLET | Freq: Every day | ORAL | Status: DC

## 2014-09-18 ENCOUNTER — Ambulatory Visit (INDEPENDENT_AMBULATORY_CARE_PROVIDER_SITE_OTHER): Payer: Medicare Other | Admitting: Internal Medicine

## 2014-09-18 ENCOUNTER — Telehealth: Payer: Self-pay | Admitting: Internal Medicine

## 2014-09-18 ENCOUNTER — Encounter: Payer: Self-pay | Admitting: Internal Medicine

## 2014-09-18 VITALS — BP 130/82 | HR 65 | Ht 75.0 in | Wt 295.0 lb

## 2014-09-18 DIAGNOSIS — Z79899 Other long term (current) drug therapy: Secondary | ICD-10-CM

## 2014-09-18 DIAGNOSIS — I10 Essential (primary) hypertension: Secondary | ICD-10-CM

## 2014-09-18 DIAGNOSIS — I481 Persistent atrial fibrillation: Secondary | ICD-10-CM | POA: Diagnosis not present

## 2014-09-18 DIAGNOSIS — I4819 Other persistent atrial fibrillation: Secondary | ICD-10-CM

## 2014-09-18 DIAGNOSIS — G4733 Obstructive sleep apnea (adult) (pediatric): Secondary | ICD-10-CM

## 2014-09-18 LAB — T4, FREE: Free T4: 1 ng/dL (ref 0.60–1.60)

## 2014-09-18 LAB — HEPATIC FUNCTION PANEL
ALT: 16 U/L (ref 0–53)
AST: 19 U/L (ref 0–37)
Albumin: 3.8 g/dL (ref 3.5–5.2)
Alkaline Phosphatase: 61 U/L (ref 39–117)
Bilirubin, Direct: 0.1 mg/dL (ref 0.0–0.3)
Total Bilirubin: 0.9 mg/dL (ref 0.2–1.2)
Total Protein: 7.5 g/dL (ref 6.0–8.3)

## 2014-09-18 LAB — TSH: TSH: 0.83 u[IU]/mL (ref 0.35–4.50)

## 2014-09-18 MED ORDER — AMIODARONE HCL 200 MG PO TABS: 100.0000 mg | ORAL_TABLET | Freq: Every day | ORAL | Status: DC

## 2014-09-18 NOTE — Telephone Encounter (Signed)
New problem ° ° °Pt returning your call. °

## 2014-09-18 NOTE — Telephone Encounter (Signed)
Amiodarone 100mg  daily is correct dose

## 2014-09-18 NOTE — Progress Notes (Signed)
PCP:  Cain Saupe, MD  The patient presents today for electrophysiology followup.  He has done well since his last visit.  He has had no further AF and is pleased with the results of his procedure. Today, he denies symptoms of palpitations, chest pain, shortness of breath, orthopnea, PND, dizziness, presyncope, syncope, or neurologic sequela.  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  The patient feels that he is tolerating medications without difficulties and is otherwise without complaint today.   Past Medical History  Diagnosis Date  . Paroxysmal atrial fibrillation   . Obesity   . ANEMIA   . BRADYCARDIA   . OSA (obstructive sleep apnea)   . Bradycardia   . Hypertension    Past Surgical History  Procedure Laterality Date  . Bilateral knee arthroscopy    . Appendectomy    . L eye cataract    . Tee without cardioversion  04/29/2012    Procedure: TRANSESOPHAGEAL ECHOCARDIOGRAM (TEE);  Surgeon: Vesta Mixer, MD;  Location: [facility] ENDOSCOPY;  Service: Cardiovascular;  Laterality: [name];  changed from nish to nahser/dl  . Cardioversion  04/29/2012    Procedure: CARDIOVERSION;  Surgeon: Vesta Mixer, MD;  Location: [facility] ENDOSCOPY;  Service: Cardiovascular;  Laterality: [name];  . Cardioversion [name] 12/21/2013    Procedure: CARDIOVERSION;  Surgeon: Wendall Stade, MD;  Location: [facility] ENDOSCOPY;  Service: Cardiovascular;  Laterality: [name];  . Tee without cardioversion [name] 01/25/2014    Procedure: TRANSESOPHAGEAL ECHOCARDIOGRAM (TEE);  Surgeon: [name], MD;  Location: [facility] ENDOSCOPY;  Service: Cardiovascular;  Laterality: [name];  . Ablation  01/26/14    PVI by Dr Johney Frame  . Cardioversion [name] 05/30/2014    Procedure: CARDIOVERSION;  Surgeon: Laurey Morale, MD;  Location: [facility] ENDOSCOPY;  Service: Cardiovascular;  Laterality: [name];    Current Outpatient Prescriptions  Medication Sig Dispense Refill  . amiodarone (PACERONE) 200 MG tablet Take 0.5 tablets (100 mg total) by mouth daily. 90  tablet 3  . amLODipine (NORVASC) 5 MG tablet Take 5 mg by mouth daily.     . metoprolol succinate (TOPROL-XL) 50 MG 24 hr tablet Take 2 tablets (100 mg total) by mouth daily. Take with or immediately following a meal. 180 tablet 0  . Rivaroxaban (XARELTO) 20 MG TABS tablet Take 20 mg by mouth daily with supper.     No current facility-administered medications for this visit.    Allergies  Allergen Reactions  . Hydrocodone     Severe itching  . Other     All narcotics - Severe itching     History   Social History  . Marital Status: Single    Spouse Name: [name]    Number of Children: [name]  . Years of Education: [name]   Occupational History  . retired    Social History Main Topics  . Smoking status: Never Smoker   . Smokeless tobacco: Not on file  . Alcohol Use: No  . Drug Use: No  . Sexual Activity: Yes   Other Topics Concern  . Not on file   Social History Narrative   Lives with mother in [place].  Retired/ disabled from [employer].    Family History  Problem Relation Age of Onset  . Diabetes Mother   . Hypertension Mother   . Hypertension Father     ROS-  All systems are reviewed and are negative except as outlined in the HPI above  Physical Exam: Filed Vitals:   09/18/14 1423  BP: 130/82  Pulse: 65  Height: 6\' 3"  (1.905 m)  Weight: 295 lb (133.811 kg)    GEN- The patient is well appearing, alert and oriented x 3 today.   Head- normocephalic, atraumatic Eyes-  Sclera clear, conjunctiva pink Ears- hearing intact Oropharynx- clear Neck- supple  Lungs- Clear to ausculation bilaterally, normal work of breathing Heart- RRR, no murmurs, rubs or gallops, PMI not laterally displaced GI- soft, NT, ND, + BS Extremities- no clubbing, cyanosis, trace edema Neuro- strength and sensation are intact  ekg reveals sinus rhythm 65 bpm, PR 242, incomplete RBB, LAD, Qtc 474  Assessment and Plan:  1.  Persistent AF- Maintaining sinus rhythm post ablation Reduce  amiodarone to 100mg  daily today Check lfts/tfts Consider stopping Amio if no further af upon return Continue xarleto  2. htn Stable No change required today  3. osa Compliance with CPAP is encouraged.  He says that he has been compliant  Return in 3 months

## 2014-09-18 NOTE — Patient Instructions (Signed)
Your physician recommends that you schedule a follow-up appointment in: 3 months with Dr. Johney Frame  Your physician recommends that you return for lab work today  Your physician has recommended you make the following change in your medication:  1) Decrease Amiodarone to 100mg  daily

## 2014-09-21 ENCOUNTER — Encounter (HOSPITAL_COMMUNITY): Payer: Self-pay | Admitting: Internal Medicine

## 2014-09-26 ENCOUNTER — Telehealth: Payer: Self-pay | Admitting: Internal Medicine

## 2014-09-26 NOTE — Telephone Encounter (Signed)
New Msg    Pt states to contact ASAP but didn't give details. Left call back # of [phone].

## 2014-09-26 NOTE — Telephone Encounter (Signed)
Samples outfront 

## 2014-09-27 DIAGNOSIS — H6123 Impacted cerumen, bilateral: Secondary | ICD-10-CM | POA: Diagnosis not present

## 2014-10-10 ENCOUNTER — Telehealth: Payer: Self-pay | Admitting: Internal Medicine

## 2014-10-10 NOTE — Telephone Encounter (Signed)
New Msg       Pt would like a call back, states he has a medical questions, didn't provide details.   Please call at [phone].

## 2014-10-10 NOTE — Telephone Encounter (Signed)
Called and wanted to know if he can eat a RUM cake.  Told him to enjoy.

## 2014-10-12 ENCOUNTER — Telehealth: Payer: Self-pay | Admitting: Internal Medicine

## 2014-10-12 NOTE — Telephone Encounter (Signed)
New Message        Pt calling stating he will run out of Metoprolol on Saturday and states the pharmacy needs the refill to be put in the system, attempted to contact medication dept but they are gone for the day. Please call back and advise.

## 2014-10-12 NOTE — Telephone Encounter (Signed)
Pt called b/c he is running out of his metoprolol & could not get a refill. Running out of metoprolol Saturday. States he is in [place]. I called [facility] & they filled an old prescription. They have now activated the new prescription which was sent in on 09/15/14  I called pt back & he is aware he can pick this up in [place]. Mylo Red RN

## 2014-10-17 DIAGNOSIS — M25562 Pain in left knee: Secondary | ICD-10-CM | POA: Diagnosis not present

## 2014-10-17 DIAGNOSIS — M25561 Pain in right knee: Secondary | ICD-10-CM | POA: Diagnosis not present

## 2014-10-18 DIAGNOSIS — M13862 Other specified arthritis, left knee: Secondary | ICD-10-CM | POA: Diagnosis not present

## 2014-10-18 DIAGNOSIS — M13861 Other specified arthritis, right knee: Secondary | ICD-10-CM | POA: Diagnosis not present

## 2014-10-19 ENCOUNTER — Telehealth: Payer: Self-pay | Admitting: Internal Medicine

## 2014-10-19 MED ORDER — RIVAROXABAN 20 MG PO TABS: 20.0000 mg | ORAL_TABLET | Freq: Every day | ORAL | Status: DC

## 2014-10-19 NOTE — Telephone Encounter (Signed)
New msg        Please contact pt in regards to medication concern. No details given.

## 2014-10-19 NOTE — Telephone Encounter (Signed)
Rx sent into [place] for Xarelto

## 2014-11-10 ENCOUNTER — Telehealth: Payer: Self-pay | Admitting: Internal Medicine

## 2014-11-10 ENCOUNTER — Other Ambulatory Visit: Payer: Self-pay | Admitting: Internal Medicine

## 2014-11-10 NOTE — Telephone Encounter (Signed)
New Message         Pt calling stating that he wants a return phone call from [place]. Pt will not state what call is in regards to. Please call back and advise.

## 2014-11-10 NOTE — Telephone Encounter (Signed)
Will get samples for patient to be put out front of Xarelto 20mg 

## 2014-12-13 ENCOUNTER — Encounter: Payer: Self-pay | Admitting: Internal Medicine

## 2014-12-13 ENCOUNTER — Ambulatory Visit (INDEPENDENT_AMBULATORY_CARE_PROVIDER_SITE_OTHER): Payer: Medicare Other | Admitting: Internal Medicine

## 2014-12-13 VITALS — BP 130/84 | HR 56 | Ht 75.0 in | Wt 293.1 lb

## 2014-12-13 DIAGNOSIS — I4819 Other persistent atrial fibrillation: Secondary | ICD-10-CM

## 2014-12-13 DIAGNOSIS — G4733 Obstructive sleep apnea (adult) (pediatric): Secondary | ICD-10-CM

## 2014-12-13 DIAGNOSIS — I10 Essential (primary) hypertension: Secondary | ICD-10-CM

## 2014-12-13 DIAGNOSIS — I481 Persistent atrial fibrillation: Secondary | ICD-10-CM | POA: Diagnosis not present

## 2014-12-13 NOTE — Progress Notes (Signed)
PCP:  Maurice Saupe, MD  The patient presents today for electrophysiology followup.  He has done well since his last visit.  He has had no further AF and is pleased with the results of his procedure. He currently is taking amiodarone 100 mg a day and Xarelto without bleeding issues.   Today, he denies symptoms of palpitations, chest pain, shortness of breath, orthopnea, PND, dizziness, presyncope, syncope, or neurologic sequela.  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  The patient feels that he is tolerating medications without difficulties and is otherwise without complaint today.   Past Medical History  Diagnosis Date  . Paroxysmal atrial fibrillation   . Obesity   . ANEMIA   . BRADYCARDIA   . OSA (obstructive sleep apnea)   . Bradycardia   . Hypertension    Past Surgical History  Procedure Laterality Date  . Bilateral knee arthroscopy    . Appendectomy    . L eye cataract    . Tee without cardioversion  04/29/2012    Procedure: TRANSESOPHAGEAL ECHOCARDIOGRAM (TEE);  Surgeon: Vesta Mixer, MD;  Location: [facility] ENDOSCOPY;  Service: Cardiovascular;  Laterality: [name];  changed from nish to nahser/dl  . Cardioversion  04/29/2012    Procedure: CARDIOVERSION;  Surgeon: Vesta Mixer, MD;  Location: [facility] ENDOSCOPY;  Service: Cardiovascular;  Laterality: [name];  . Cardioversion [name] 12/21/2013    Procedure: CARDIOVERSION;  Surgeon: Wendall Stade, MD;  Location: [facility] ENDOSCOPY;  Service: Cardiovascular;  Laterality: [name];  . Tee without cardioversion [name] 01/25/2014    Procedure: TRANSESOPHAGEAL ECHOCARDIOGRAM (TEE);  Surgeon: [name], MD;  Location: [facility] ENDOSCOPY;  Service: Cardiovascular;  Laterality: [name];  . Ablation  01/26/14    PVI by Dr Johney Frame  . Cardioversion [name] 05/30/2014    Procedure: CARDIOVERSION;  Surgeon: Laurey Morale, MD;  Location: [facility] ENDOSCOPY;  Service: Cardiovascular;  Laterality: [name];  . Atrial fibrillation ablation [name] 01/26/2014    Procedure: ATRIAL  FIBRILLATION ABLATION;  Surgeon: Gardiner Rhyme, MD;  Location: [facility] CATH LAB;  Service: Cardiovascular;  Laterality: [name];    Current Outpatient Prescriptions  Medication Sig Dispense Refill  . amLODipine (NORVASC) 5 MG tablet Take 5 mg by mouth daily.     . metoprolol succinate (TOPROL-XL) 50 MG 24 hr tablet Take 2 tablets (100 mg total) by mouth daily. Take with or immediately following a meal. 180 tablet 0  . rivaroxaban (XARELTO) 20 MG TABS tablet Take 1 tablet (20 mg total) by mouth daily with supper. 30 tablet 11   No current facility-administered medications for this visit.    Allergies  Allergen Reactions  . Hydrocodone     Severe itching  . Other     All narcotics - Severe itching     History   Social History  . Marital Status: Single    Spouse Name: [name]  . Number of Children: [name]  . Years of Education: [name]   Occupational History  . retired    Social History Main Topics  . Smoking status: Never Smoker   . Smokeless tobacco: Not on file  . Alcohol Use: No  . Drug Use: No  . Sexual Activity: Yes   Other Topics Concern  . Not on file   Social History Narrative   Lives with mother in [place].  Retired/ disabled from [employer].    Family History  Problem Relation Age of Onset  . Diabetes Mother   . Hypertension Mother   . Hypertension Father  ROS-  All systems are reviewed and are negative except as outlined in the HPI above  Physical Exam: Filed Vitals:   12/13/14 1011  BP: 130/84  Pulse: 56  Height: 6\' 3"  (1.905 m)  Weight: 293 lb 1.9 oz (132.958 kg)    GEN- The patient is well appearing, alert and oriented x 3 today.   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  Assessment and Plan:  1.  Persistent AF- Maintaining sinus rhythm post ablation Stop amiodarone Continue xarleto  2. htn Stable No change required today  3. osa Compliance with CPAP is encouraged.  He says that he has been compliant  Return in 3 months

## 2014-12-13 NOTE — Progress Notes (Deleted)
 PCP:  FULP, CAMMIE, MD  The patient presents today for electrophysiology followup.  He has done well since his last visit.  He has had no further AF and is pleased with the results of his procedure. He currently is taking amiodarone 100 mg a day and Xarelto without bleeding issues.   Today, he denies symptoms of palpitations, chest pain, shortness of breath, orthopnea, PND, dizziness, presyncope, syncope, or neurologic sequela.  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  The patient feels that he is tolerating medications without difficulties and is otherwise without complaint today.   Past Medical History  Diagnosis Date  . Paroxysmal atrial fibrillation   . Obesity   . ANEMIA   . BRADYCARDIA   . OSA (obstructive sleep apnea)   . Bradycardia   . Hypertension    Past Surgical History  Procedure Laterality Date  . Bilateral knee arthroscopy    . Appendectomy    . L eye cataract    . Tee without cardioversion  04/29/2012    Procedure: TRANSESOPHAGEAL ECHOCARDIOGRAM (TEE);  Surgeon: Philip J Nahser, MD;  Location: [facility] ENDOSCOPY;  Service: Cardiovascular;  Laterality: [name];  changed from nish to nahser/dl  . Cardioversion  04/29/2012    Procedure: CARDIOVERSION;  Surgeon: Philip J Nahser, MD;  Location: [facility] ENDOSCOPY;  Service: Cardiovascular;  Laterality: [name];  . Cardioversion [name] 12/21/2013    Procedure: CARDIOVERSION;  Surgeon: Peter C Nishan, MD;  Location: [facility] ENDOSCOPY;  Service: Cardiovascular;  Laterality: [name];  . Tee without cardioversion [name] 01/25/2014    Procedure: TRANSESOPHAGEAL ECHOCARDIOGRAM (TEE);  Surgeon: Kenneth C. Hilty, MD;  Location: [facility] ENDOSCOPY;  Service: Cardiovascular;  Laterality: [name];  . Ablation  01/26/14    PVI by Dr Allred  . Cardioversion [name] 05/30/2014    Procedure: CARDIOVERSION;  Surgeon: Dalton S McLean, MD;  Location: [facility] ENDOSCOPY;  Service: Cardiovascular;  Laterality: [name];  . Atrial fibrillation ablation [name] 01/26/2014    Procedure: ATRIAL  FIBRILLATION ABLATION;  Surgeon: James D Allred, MD;  Location: [facility] CATH LAB;  Service: Cardiovascular;  Laterality: [name];    Current Outpatient Prescriptions  Medication Sig Dispense Refill  . amLODipine (NORVASC) 5 MG tablet Take 5 mg by mouth daily.     . metoprolol succinate (TOPROL-XL) 50 MG 24 hr tablet Take 2 tablets (100 mg total) by mouth daily. Take with or immediately following a meal. 180 tablet 0  . rivaroxaban (XARELTO) 20 MG TABS tablet Take 1 tablet (20 mg total) by mouth daily with supper. 30 tablet 11   No current facility-administered medications for this visit.    Allergies  Allergen Reactions  . Hydrocodone     Severe itching  . Other     All narcotics - Severe itching     History   Social History  . Marital Status: Single    Spouse Name: [name]  . Number of Children: [name]  . Years of Education: [name]   Occupational History  . retired    Social History Main Topics  . Smoking status: Never Smoker   . Smokeless tobacco: Not on file  . Alcohol Use: No  . Drug Use: No  . Sexual Activity: Yes   Other Topics Concern  . Not on file   Social History Narrative   Lives with mother in [place].  Retired/ disabled from [employer].    Family History  Problem Relation Age of Onset  . Diabetes Mother   . Hypertension Mother   . Hypertension Father       ROS-  All systems are reviewed and are negative except as outlined in the HPI above  Physical Exam: Filed Vitals:   12/13/14 1011  BP: 130/84  Pulse: 56  Height: 6' 3" (1.905 m)  Weight: 293 lb 1.9 oz (132.958 kg)    GEN- The patient is well appearing, alert and oriented x 3 today.   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  Assessment and Plan:  1.  Persistent AF- Maintaining sinus rhythm post ablation Stop amiodarone Continue xarleto  2. htn Stable No change required today  3. osa Compliance with CPAP is encouraged.  He says that he has been compliant  Return in 3 months 

## 2014-12-13 NOTE — Patient Instructions (Signed)
Your physician recommends that you schedule a follow-up appointment in: 3 months with Dr Allred   Your physician has recommended you make the following change in your medication:  1) Stop Amiodarone   

## 2014-12-18 ENCOUNTER — Telehealth: Payer: Self-pay | Admitting: Internal Medicine

## 2014-12-18 NOTE — Telephone Encounter (Signed)
Spoke with patient and he is c/o off a dizzy feeling that last only a second when going from sitting to standing.  I have asked him to make sure he is hydrated and let me know if it does not get better with hydration.  He was wondering if it could be coming off the Amiodarone that was making him have this.  As the symptoms only last for a second and do not stay with him probably not the medication but he will call me if he does not get better

## 2014-12-18 NOTE — Telephone Encounter (Signed)
New Message  Pt requested to speak w/ Rn. Please call back and discuss.  

## 2014-12-19 DIAGNOSIS — H6690 Otitis media, unspecified, unspecified ear: Secondary | ICD-10-CM | POA: Diagnosis not present

## 2014-12-19 DIAGNOSIS — R42 Dizziness and giddiness: Secondary | ICD-10-CM | POA: Diagnosis not present

## 2014-12-19 DIAGNOSIS — Z7901 Long term (current) use of anticoagulants: Secondary | ICD-10-CM | POA: Diagnosis not present

## 2014-12-19 DIAGNOSIS — H6120 Impacted cerumen, unspecified ear: Secondary | ICD-10-CM | POA: Diagnosis not present

## 2015-01-01 DIAGNOSIS — H8110 Benign paroxysmal vertigo, unspecified ear: Secondary | ICD-10-CM | POA: Diagnosis not present

## 2015-01-01 DIAGNOSIS — R42 Dizziness and giddiness: Secondary | ICD-10-CM | POA: Diagnosis not present

## 2015-01-01 DIAGNOSIS — G4733 Obstructive sleep apnea (adult) (pediatric): Secondary | ICD-10-CM | POA: Diagnosis not present

## 2015-01-16 ENCOUNTER — Telehealth: Payer: Self-pay | Admitting: Internal Medicine

## 2015-01-16 NOTE — Telephone Encounter (Signed)
Requesting a call from [facility] not disclose what the call is regarding

## 2015-01-16 NOTE — Telephone Encounter (Signed)
Samples of Xarelto for patient  He will come get today

## 2015-02-05 ENCOUNTER — Telehealth: Payer: Self-pay | Admitting: Internal Medicine

## 2015-02-05 NOTE — Telephone Encounter (Signed)
New message     Patient calling did not want to disclose any information - stated he just need to speak with the nurse.

## 2015-02-05 NOTE — Telephone Encounter (Signed)
New Message    Patient is calling again to speak with nurse, did not go into specifics, just said he would like to speak to [place].

## 2015-02-05 NOTE — Telephone Encounter (Signed)
Called patient and let him know I would have samples for him out front tomorrow

## 2015-03-02 ENCOUNTER — Other Ambulatory Visit: Payer: Self-pay | Admitting: Cardiology

## 2015-03-02 ENCOUNTER — Telehealth: Payer: Self-pay | Admitting: Internal Medicine

## 2015-03-02 ENCOUNTER — Telehealth: Payer: Self-pay | Admitting: Cardiology

## 2015-03-02 MED ORDER — RIVAROXABAN 20 MG PO TABS: 20.0000 mg | ORAL_TABLET | Freq: Every day | ORAL | Status: DC

## 2015-03-02 NOTE — Telephone Encounter (Signed)
Needed Xarelto refil  Corine Shelter PA-C 03/02/2015 5:44 PM

## 2015-03-02 NOTE — Telephone Encounter (Signed)
lmom for patient to return my call 

## 2015-03-02 NOTE — Telephone Encounter (Signed)
New Message       Pt calling stating he needs to speak to [facility], pt will not elaborate. Please call back and advise.

## 2015-03-03 ENCOUNTER — Emergency Department (HOSPITAL_COMMUNITY)
Admission: EM | Admit: 2015-03-03 | Discharge: 2015-03-03 | Disposition: A | Discharge status: Home / Self Care | Payer: Medicare Other | Attending: Emergency Medicine | Admitting: Emergency Medicine

## 2015-03-03 ENCOUNTER — Encounter (HOSPITAL_COMMUNITY): Payer: Self-pay | Admitting: *Deleted

## 2015-03-03 ENCOUNTER — Other Ambulatory Visit (HOSPITAL_COMMUNITY): Payer: Self-pay

## 2015-03-03 DIAGNOSIS — E669 Obesity, unspecified: Secondary | ICD-10-CM | POA: Diagnosis not present

## 2015-03-03 DIAGNOSIS — Z862 Personal history of diseases of the blood and blood-forming organs and certain disorders involving the immune mechanism: Secondary | ICD-10-CM | POA: Diagnosis not present

## 2015-03-03 DIAGNOSIS — R030 Elevated blood-pressure reading, without diagnosis of hypertension: Secondary | ICD-10-CM | POA: Diagnosis not present

## 2015-03-03 DIAGNOSIS — Z8669 Personal history of other diseases of the nervous system and sense organs: Secondary | ICD-10-CM | POA: Insufficient documentation

## 2015-03-03 DIAGNOSIS — R002 Palpitations: Secondary | ICD-10-CM | POA: Insufficient documentation

## 2015-03-03 DIAGNOSIS — Z7901 Long term (current) use of anticoagulants: Secondary | ICD-10-CM | POA: Insufficient documentation

## 2015-03-03 DIAGNOSIS — I10 Essential (primary) hypertension: Secondary | ICD-10-CM | POA: Insufficient documentation

## 2015-03-03 DIAGNOSIS — Z79899 Other long term (current) drug therapy: Secondary | ICD-10-CM | POA: Insufficient documentation

## 2015-03-03 NOTE — ED Notes (Signed)
Pt is in stable condition upon d/c and ambulates from ED. 

## 2015-03-03 NOTE — ED Notes (Signed)
Pt arrives from home via [facility]. Pt states he feels like his heart is racing since 0100 this morning. Pt has a hx of a fib and htn. Pt had an ablation by Dr. Johney Frame in 2015.

## 2015-03-03 NOTE — ED Provider Notes (Signed)
CSN: [id number]     Arrival date & time 03/03/15  1721 History   First MD Initiated Contact with Patient 03/03/15 1727     Chief Complaint  Patient presents with  . Atrial Fibrillation     (Consider location/radiation/quality/duration/timing/severity/associated sxs/prior Treatment) HPI Comments: 54 year old male history of paroxysmal atrial fibrillation comes in with chief complaint of palpitations. Patient states he had palpitations for the last 12-16 hours. Reports no chest pain shortness of breath etc. Had not been taking any new medication stimulants etc. Called ambulance after continued to have palpitations after several hours. Is followed by cardiology. On Zarontin. Been compliant with medication. 54 year old male on arrival normal sinus rate and rhythm. Pulse and my examination the 80s. Patient denies any chest pain shortness of breath or other concerning signs or symptoms. Patient does have a history of paroxysmal atrial fibrillation is artery on appropriate intellect 54 year old male with complaints of palpitations. Has history of paroxysmal atrial fibrillation     Patient is a 54 y.o. male presenting with palpitations.  Palpitations Palpitations quality:  Fast Onset quality:  Sudden Timing:  Intermittent Progression:  Waxing and waning Chronicity:  Recurrent Context: not caffeine, not illicit drugs and not stimulant use   Relieved by:  Nothing Worsened by:  Nothing Ineffective treatments:  Deep relaxation Associated symptoms: no back pain and no shortness of breath     Past Medical History  Diagnosis Date  . Paroxysmal atrial fibrillation   . Obesity   . ANEMIA   . BRADYCARDIA   . OSA (obstructive sleep apnea)   . Bradycardia   . Hypertension    Past Surgical History  Procedure Laterality Date  . Bilateral knee arthroscopy    . Appendectomy    . L eye cataract    . Tee without cardioversion  04/29/2012    Procedure: TRANSESOPHAGEAL ECHOCARDIOGRAM (TEE);  Surgeon:  Vesta Mixer, MD;  Location: [facility] ENDOSCOPY;  Service: Cardiovascular;  Laterality: [name];  changed from nish to nahser/dl  . Cardioversion  04/29/2012    Procedure: CARDIOVERSION;  Surgeon: Vesta Mixer, MD;  Location: [facility] ENDOSCOPY;  Service: Cardiovascular;  Laterality: [name];  . Cardioversion [name] 12/21/2013    Procedure: CARDIOVERSION;  Surgeon: Wendall Stade, MD;  Location: [facility] ENDOSCOPY;  Service: Cardiovascular;  Laterality: [name];  . Tee without cardioversion [name] 01/25/2014    Procedure: TRANSESOPHAGEAL ECHOCARDIOGRAM (TEE);  Surgeon: [name], MD;  Location: [facility] ENDOSCOPY;  Service: Cardiovascular;  Laterality: [name];  . Ablation  01/26/14    PVI by Dr Johney Frame  . Cardioversion [name] 05/30/2014    Procedure: CARDIOVERSION;  Surgeon: Laurey Morale, MD;  Location: [facility] ENDOSCOPY;  Service: Cardiovascular;  Laterality: [name];  . Atrial fibrillation ablation [name] 01/26/2014    Procedure: ATRIAL FIBRILLATION ABLATION;  Surgeon: Gardiner Rhyme, MD;  Location: [facility] CATH LAB;  Service: Cardiovascular;  Laterality: [name];   Family History  Problem Relation Age of Onset  . Diabetes Mother   . Hypertension Mother   . Hypertension Father    History  Substance Use Topics  . Smoking status: Never Smoker   . Smokeless tobacco: Not on file  . Alcohol Use: No    Review of Systems  Constitutional: Negative for fatigue.  HENT: Negative for congestion.   Respiratory: Negative for shortness of breath.   Cardiovascular: Positive for palpitations.  Gastrointestinal: Negative for abdominal pain.  Musculoskeletal: Negative for back pain.  Neurological: Negative for headaches.  Psychiatric/Behavioral: Negative for confusion.  All other systems reviewed  and are negative.     Allergies  Hydrocodone and Other  Home Medications   Prior to Admission medications   Medication Sig Start Date End Date Taking? Authorizing Provider  amLODipine (NORVASC) 5 MG tablet Take 5 mg by mouth daily.  07/20/13    Historical Provider, MD  metoprolol succinate (TOPROL-XL) 50 MG 24 hr tablet Take 2 tablets (100 mg total) by mouth daily. Take with or immediately following a meal. 09/15/14   Hillis Range, MD  rivaroxaban (XARELTO) 20 MG TABS tablet Take 1 tablet (20 mg total) by mouth daily with supper. 03/02/15   Luke K Kilroy, PA-C   BP 153/100 mmHg  Pulse 86  Temp(Src) 98.6 F (37 C)  Resp 21  Ht 6\' 3"  (1.905 m)  Wt 283 lb 11.7 oz (128.7 kg)  BMI 35.46 kg/m2  SpO2 96% Physical Exam  Constitutional: He is oriented to person, place, and time. He appears well-developed and well-nourished.  HENT:  Head: Normocephalic.  Eyes: Pupils are equal, round, and reactive to light.  Neck: Normal range of motion.  Cardiovascular: Normal rate, regular rhythm and intact distal pulses.   Pulmonary/Chest: Effort normal. No respiratory distress.  Abdominal: Soft.  Musculoskeletal: He exhibits no tenderness.  Neurological: He is alert and oriented to person, place, and time. No cranial nerve deficit. He exhibits normal muscle tone.  Skin: Skin is warm.  Psychiatric: He has a normal mood and affect.  Vitals reviewed.   ED Course  Procedures (including critical care time) Labs Review Labs Reviewed - No data to display  Imaging Review No results found.   EKG Interpretation None      MDM  54 year old male history of paroxysmal atrial fibrillation comes with complaints palpitations for several hours. He denies any chest pain shortness of breath or other concerning signs or symptoms. On arrival to the emergency department he is in normal sinus rate and rhythm with a pulse rate in the 80s. He is on appropriate anticoagulation with Xarelto and reports being compliant. He is followed by cardiology and is status Jakobe Blau ablation. He does occasionally have runs of palpitations at baseline. His only concern today was to seem to be more protracted than usual. However at this time is completely asystematic back in normal  rate and rhythm. Patient denies any stimulant use caffeine or other inciting. EKG confirms normal sinus rate and rhythm. No concerning findings or changes to his EKG. Believe patient appropriate for outpatient management and follow up cardiology  Final diagnoses:  None        Bridgett Larsson, MD 03/03/15 1753  Tilden Fossa, MD 03/03/15 2136

## 2015-03-05 NOTE — Telephone Encounter (Signed)
Follow up ° ° ° ° ° °Returning Kelly's call °

## 2015-03-05 NOTE — Telephone Encounter (Signed)
Samples of Xarelto left out front.  lmom for him to come pick up

## 2015-03-19 ENCOUNTER — Ambulatory Visit (INDEPENDENT_AMBULATORY_CARE_PROVIDER_SITE_OTHER): Payer: Medicare Other | Admitting: Internal Medicine

## 2015-03-19 ENCOUNTER — Encounter: Payer: Self-pay | Admitting: Internal Medicine

## 2015-03-19 VITALS — BP 148/86 | HR 64 | Ht 75.0 in | Wt 296.0 lb

## 2015-03-19 DIAGNOSIS — I4819 Other persistent atrial fibrillation: Secondary | ICD-10-CM

## 2015-03-19 DIAGNOSIS — I481 Persistent atrial fibrillation: Secondary | ICD-10-CM | POA: Diagnosis not present

## 2015-03-19 DIAGNOSIS — I10 Essential (primary) hypertension: Secondary | ICD-10-CM | POA: Diagnosis not present

## 2015-03-19 DIAGNOSIS — G4733 Obstructive sleep apnea (adult) (pediatric): Secondary | ICD-10-CM

## 2015-03-19 NOTE — Progress Notes (Signed)
PCP:  Maurice Saupe, MD  The patient presents today for electrophysiology followup.  He has done well since his last visit.  He had a few palpitations several weeks ago for which he presented to [facility] ED.  He was in sinus rhythm upon arrival.  Today, he denies symptoms of palpitations, chest pain, shortness of breath, orthopnea, PND, dizziness, presyncope, syncope, or neurologic sequela.  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  The patient feels that he is tolerating medications without difficulties and is otherwise without complaint today.   Past Medical History  Diagnosis Date  . Paroxysmal atrial fibrillation   . Obesity   . ANEMIA   . BRADYCARDIA   . OSA (obstructive sleep apnea)   . Bradycardia   . Hypertension    Past Surgical History  Procedure Laterality Date  . Bilateral knee arthroscopy    . Appendectomy    . L eye cataract    . Tee without cardioversion  04/29/2012    Procedure: TRANSESOPHAGEAL ECHOCARDIOGRAM (TEE);  Surgeon: Vesta Mixer, MD;  Location: [facility] ENDOSCOPY;  Service: Cardiovascular;  Laterality: [name];  changed from nish to nahser/dl  . Cardioversion  04/29/2012    Procedure: CARDIOVERSION;  Surgeon: Vesta Mixer, MD;  Location: [facility] ENDOSCOPY;  Service: Cardiovascular;  Laterality: [name];  . Cardioversion [name] 12/21/2013    Procedure: CARDIOVERSION;  Surgeon: Wendall Stade, MD;  Location: [facility] ENDOSCOPY;  Service: Cardiovascular;  Laterality: [name];  . Tee without cardioversion [name] 01/25/2014    Procedure: TRANSESOPHAGEAL ECHOCARDIOGRAM (TEE);  Surgeon: [name], MD;  Location: [facility] ENDOSCOPY;  Service: Cardiovascular;  Laterality: [name];  . Ablation  01/26/14    PVI by Dr Johney Frame  . Cardioversion [name] 05/30/2014    Procedure: CARDIOVERSION;  Surgeon: Laurey Morale, MD;  Location: [facility] ENDOSCOPY;  Service: Cardiovascular;  Laterality: [name];  . Atrial fibrillation ablation [name] 01/26/2014    Procedure: ATRIAL FIBRILLATION ABLATION;  Surgeon: Gardiner Rhyme, MD;  Location: [facility] CATH LAB;  Service: Cardiovascular;  Laterality: [name];    Current Outpatient Prescriptions  Medication Sig Dispense Refill  . amLODipine (NORVASC) 5 MG tablet Take 5 mg by mouth daily.     . metoprolol succinate (TOPROL-XL) 50 MG 24 hr tablet Take 2 tablets (100 mg total) by mouth daily. Take with or immediately following a meal. 180 tablet 0  . rivaroxaban (XARELTO) 20 MG TABS tablet Take 1 tablet (20 mg total) by mouth daily with supper. 30 tablet 11   No current facility-administered medications for this visit.    Allergies  Allergen Reactions  . Hydrocodone     Severe itching  . Other     All narcotics - Severe itching     History   Social History  . Marital Status: Single    Spouse Name: [name]  . Number of Children: [name]  . Years of Education: [name]   Occupational History  . retired    Social History Main Topics  . Smoking status: Never Smoker   . Smokeless tobacco: Not on file  . Alcohol Use: No  . Drug Use: No  . Sexual Activity: Yes   Other Topics Concern  . Not on file   Social History Narrative   Lives with mother in [place].  Retired/ disabled from [employer].    Family History  Problem Relation Age of Onset  . Diabetes Mother   . Hypertension Mother   . Hypertension Father     ROS-  All  systems are reviewed and are negative except as outlined in the HPI above  Physical Exam: Filed Vitals:   03/19/15 1146  BP: 148/86  Pulse: 64   GEN- The patient is well appearing, alert and oriented x 3 today.   Head- normocephalic, atraumatic Eyes-  Sclera clear, conjunctiva pink Ears- hearing intact Oropharynx- clear Neck- supple  Lungs- Clear to ausculation bilaterally, normal work of breathing Heart- RRR, no murmurs, rubs or gallops, PMI not laterally displaced GI- soft, NT, ND, + BS Extremities- no clubbing, cyanosis, trace edema Neuro- strength and sensation are intact  ekg reveals sinus rhythm 64bpm, with 1st degree AV  block, incomplete RBBB  Assessment and Plan:  1.  Persistent AF- Maintaining sinus rhythm post ablation off of AAD therapy Continue xarleto echo  2. htn Stable No change required today  3. osa Compliance with CPAP is encouraged.  He says that he has been compliant  Return in 6 months

## 2015-03-19 NOTE — Patient Instructions (Signed)
Medication Instructions:  Your physician recommends that you continue on your current medications as directed. Please refer to the Current Medication list given to you today.   Labwork: None ordered  Testing/Procedures: Your physician has requested that you have an echocardiogram. Echocardiography is a painless test that uses sound waves to create images of your heart. It provides your doctor with information about the size and shape of your heart and how well your heart's chambers and valves are working. This procedure takes approximately one hour. There are no restrictions for this procedure.    Follow-Up: Your physician wants you to follow-up in: 6 months with Dr Jacquiline Doe will receive a reminder letter in the mail two months in advance. If you don't receive a letter, please call our office to schedule the follow-up appointment.   Any Other Special Instructions Will Be Listed Below (If Applicable).

## 2015-03-29 ENCOUNTER — Ambulatory Visit (HOSPITAL_COMMUNITY): Payer: Medicare Other | Attending: Internal Medicine

## 2015-03-29 ENCOUNTER — Other Ambulatory Visit: Payer: Self-pay

## 2015-03-29 DIAGNOSIS — I481 Persistent atrial fibrillation: Secondary | ICD-10-CM | POA: Diagnosis not present

## 2015-03-29 DIAGNOSIS — I77810 Thoracic aortic ectasia: Secondary | ICD-10-CM | POA: Diagnosis not present

## 2015-03-29 DIAGNOSIS — I517 Cardiomegaly: Secondary | ICD-10-CM | POA: Insufficient documentation

## 2015-03-29 DIAGNOSIS — I4819 Other persistent atrial fibrillation: Secondary | ICD-10-CM

## 2015-04-12 ENCOUNTER — Telehealth: Payer: Self-pay | Admitting: Internal Medicine

## 2015-04-12 NOTE — Telephone Encounter (Signed)
New Prob   Pt is requesting a call back. He did not wish to leave any details. Please call.

## 2015-04-12 NOTE — Telephone Encounter (Signed)
20 days of Xarelto left out front for patient.  I have left him a message

## 2015-04-13 ENCOUNTER — Other Ambulatory Visit (HOSPITAL_COMMUNITY): Payer: Self-pay | Admitting: *Deleted

## 2015-04-13 MED ORDER — DILTIAZEM HCL 30 MG PO TABS: ORAL_TABLET | ORAL | Status: DC

## 2015-04-13 NOTE — Telephone Encounter (Signed)
Pt called into afib clinic stating he has been out of sinus rhythm this morning.  Can tell by his irregular pulse (he takes it manually) but only other symptom is feeling sluggish.  No shortness of breath.  Patient wondering if he should go to ER since out of rhythm.  States his heart rate is ranging from 60-80bpm nothing over 100. Discussed with Rudi Coco, NP and will add in cardizem 30mg  PRN.  Instructed patient on use. Patient instructed to go to ER if develops shortness of breath, heart rates over 120-150s or chest pain.

## 2015-04-19 DIAGNOSIS — R42 Dizziness and giddiness: Secondary | ICD-10-CM | POA: Diagnosis not present

## 2015-04-19 DIAGNOSIS — R002 Palpitations: Secondary | ICD-10-CM | POA: Diagnosis not present

## 2015-04-19 DIAGNOSIS — R7309 Other abnormal glucose: Secondary | ICD-10-CM | POA: Diagnosis not present

## 2015-04-20 ENCOUNTER — Ambulatory Visit (INDEPENDENT_AMBULATORY_CARE_PROVIDER_SITE_OTHER): Payer: Medicare Other | Admitting: Internal Medicine

## 2015-04-20 ENCOUNTER — Encounter: Payer: Self-pay | Admitting: Internal Medicine

## 2015-04-20 VITALS — BP 102/60 | HR 98 | Ht 75.0 in | Wt 293.8 lb

## 2015-04-20 DIAGNOSIS — I481 Persistent atrial fibrillation: Secondary | ICD-10-CM

## 2015-04-20 DIAGNOSIS — R42 Dizziness and giddiness: Secondary | ICD-10-CM | POA: Diagnosis not present

## 2015-04-20 DIAGNOSIS — I4819 Other persistent atrial fibrillation: Secondary | ICD-10-CM

## 2015-04-20 DIAGNOSIS — Z01812 Encounter for preprocedural laboratory examination: Secondary | ICD-10-CM

## 2015-04-20 MED ORDER — DRONEDARONE HCL 400 MG PO TABS: 400.0000 mg | ORAL_TABLET | Freq: Two times a day (BID) | ORAL | Status: DC

## 2015-04-20 NOTE — Progress Notes (Signed)
Patient Care Team: Cain Saupe, MD as PCP - General (Family Medicine)   HPI  Maurice Golden is a 54 y.o. male Has a history of atrial fibrillation status post ablation (4/15). He is followed by Dr. Fawn Kirk. He was seen a month ago. At that time ECG demonstrated sinus rhythm. He is seen today because of complaints of dizziness.  She was seen by PCP yesterday. ECG demonstrated atrial fibrillation with a controlled ventricular response.  Pre-ablation he had been on dronaderone. Review of the medical record demonstrates that amiodarone was started 2 months following PVI for early recurrence of atrial fibrillation. This was discontinued 3/16.   he noted the other day recurrence of his symptoms.  Some palps and LH with SOB; no edema   Echocardiogram has demonstrated some degree of asymmetric septal hypertrophy(16/13)  Past Medical History  Diagnosis Date  . Paroxysmal atrial fibrillation   . Obesity   . ANEMIA   . BRADYCARDIA   . OSA (obstructive sleep apnea)   . Bradycardia   . Hypertension     Past Surgical History  Procedure Laterality Date  . Bilateral knee arthroscopy    . Appendectomy    . L eye cataract    . Tee without cardioversion  04/29/2012    Procedure: TRANSESOPHAGEAL ECHOCARDIOGRAM (TEE);  Surgeon: Vesta Mixer, MD;  Location: [facility] ENDOSCOPY;  Service: Cardiovascular;  Laterality: [name];  changed from nish to nahser/dl  . Cardioversion  04/29/2012    Procedure: CARDIOVERSION;  Surgeon: Vesta Mixer, MD;  Location: [facility] ENDOSCOPY;  Service: Cardiovascular;  Laterality: [name];  . Cardioversion [name] 12/21/2013    Procedure: CARDIOVERSION;  Surgeon: Wendall Stade, MD;  Location: [facility] ENDOSCOPY;  Service: Cardiovascular;  Laterality: [name];  . Tee without cardioversion [name] 01/25/2014    Procedure: TRANSESOPHAGEAL ECHOCARDIOGRAM (TEE);  Surgeon: [name], MD;  Location: [facility] ENDOSCOPY;  Service: Cardiovascular;  Laterality: [name];  . Ablation  01/26/14    PVI by Dr Johney Frame    . Cardioversion [name] 05/30/2014    Procedure: CARDIOVERSION;  Surgeon: Laurey Morale, MD;  Location: [facility] ENDOSCOPY;  Service: Cardiovascular;  Laterality: [name];  . Atrial fibrillation ablation [name] 01/26/2014    Procedure: ATRIAL FIBRILLATION ABLATION;  Surgeon: Gardiner Rhyme, MD;  Location: [facility] CATH LAB;  Service: Cardiovascular;  Laterality: [name];    Current Outpatient Prescriptions  Medication Sig Dispense Refill  . amLODipine (NORVASC) 5 MG tablet Take 5 mg by mouth daily.     . metoprolol succinate (TOPROL-XL) 50 MG 24 hr tablet Take 2 tablets (100 mg total) by mouth daily. Take with or immediately following a meal. 180 tablet 0  . rivaroxaban (XARELTO) 20 MG TABS tablet Take 1 tablet (20 mg total) by mouth daily with supper. 30 tablet 11   No current facility-administered medications for this visit.    Allergies  Allergen Reactions  . Hydrocodone     Severe itching  . Other     All narcotics - Severe itching     Review of Systems negative except from HPI and PMH  Physical Exam BP 102/60 mmHg  Pulse 98  Ht  (1.905 m)  Wt 293 lb 12.8 oz (133.267 kg)  BMI 36.72 kg/m2 Well developed and well nourished in no acute distress HENT normal E scleral and icterus clear Neck Supple JVP flat; carotids brisk and full Clear to ausculation  irregularly irregular rate and rhythm, no murmurs gallops or rub Soft with active bowel sounds No  clubbing cyanosis  Edema Alert and oriented, grossly normal motor and sensory function Skin Warm and Dry  ECG demonstrates atrial fibrillation at 98 Intervals/100/38-QTC 490 ms Axis left -84 Poor R-wave progression  Assessment and  Plan  Atrial fibrillation-persistent  Hypertension  Obstructive sleep apnea  Obesity  Left ventricular hypertrophy with asymmetric septal hypertrophy  The patient has recurrent atrial fibrillation.  His hypertrophy precludes the use of a 1C agent. QT prolongation precludes the use of dofetilide. We will  begin therapy with Multaq, understanding that is not all that great a drug and it was ineffective prior to PCI. However, the side effect profile is certainly more benign than it is with amiodarone which he previously was prescribed  We'll anticipate cardioversion following initiation; we'll have him follow-up with the atrial fibrillation clinic  His CPAP is not working well; I've advised him to follow-up with pulmonary.  His left ventricular hypertrophy while concerning for HTN given the asymmetry is on is unassociated with any family history of heart disease. Likely simply related to high blood pressure obesity and sleep apnea.

## 2015-04-20 NOTE — Patient Instructions (Addendum)
Medication Instructions:  Your physician has recommended you make the following change in your medication:  1) START Multaq 400 mg twice a day  Labwork: None ordered  Testing/Procedures: Your physician has recommended that you have a Cardioversion (DCCV). Electrical Cardioversion uses a jolt of electricity to your heart either through paddles or wired patches attached to your chest. This is a controlled, usually prescheduled, procedure. Defibrillation is done under light anesthesia in the hospital, and you usually go home the day of the procedure. This is done to get your heart back into a normal rhythm. You are not awake for the procedure. Please see the instructions below.  Follow-Up: Your physician recommends that you schedule a follow-up appointment in: 2 weeks, after cardioversion, with Rudi Coco in AFib Clinic.   Any Other Special Instructions Will Be Listed Below (If Applicable). Your provider has recommended a cardioversion.   You are scheduled for a cardioversion on 04/26/15 with Dr. Rennis Golden or associates. Please go to [facility] 2[facility] Floor Short Stay at 12:00 p.m..  Enter through the [employer] A Do not have any food or drink after midnight on 04/25/15. (You may have a clear liquid breakfast morning of procedure, if you eat before 7 a.m.) You may take your medicines with a sip of water on the day of your procedure.  You will need someone to drive you home following your procedure.   Call the [facility] office at [phone] if you have any questions, problems or concerns.     Electrical Cardioversion Electrical cardioversion is the delivery of a jolt of electricity to change the rhythm of the heart. Sticky patches or metal paddles are placed on the chest to deliver the electricity from a device. This is done to restore a normal rhythm. A rhythm that is too fast or not regular keeps the heart from pumping well. Electrical cardioversion is  done in an emergency if:   There is low or no blood pressure as a result of the heart rhythm.   Normal rhythm must be restored as fast as possible to protect the brain and heart from further damage.   It may save a life. Cardioversion may be done for heart rhythms that are not immediately life threatening, such as atrial fibrillation or flutter, in which:   The heart is beating too fast or is not regular.   Medicine to change the rhythm has not worked.   It is safe to wait in order to allow time for preparation.  Symptoms of the abnormal rhythm are bothersome.  The risk of stroke and other serious problems can be reduced.  LET [facility] CARE PROVIDER KNOW ABOUT:   Any allergies you have.  All medicines you are taking, including vitamins, herbs, eye drops, creams, and over-the-counter medicines.  Previous problems you or members of your family have had with the use of anesthetics.   Any blood disorders you have.   Previous surgeries you have had.   Medical conditions you have.   RISKS AND COMPLICATIONS  Generally, this is a safe procedure. However, problems can occur and include:   Breathing problems related to the anesthetic used.  A blood clot that breaks free and travels to other parts of your body. This could cause a stroke or other problems. The risk of this is lowered by use of blood-thinning medicine (anticoagulant) prior to the procedure.  Cardiac arrest (rare).   BEFORE THE PROCEDURE   You may have tests to  detect blood clots in your heart and to evaluate heart function.  You may start taking anticoagulants so your blood does not clot as easily.   Medicines may be given to help stabilize your heart rate and rhythm.   PROCEDURE  You will be given medicine through an IV tube to reduce discomfort and make you sleepy (sedative).   An electrical shock will be delivered.   AFTER THE PROCEDURE Your heart rhythm will be watched to make sure it  does not change. You will need someone to drive you home.

## 2015-04-25 DIAGNOSIS — Z961 Presence of intraocular lens: Secondary | ICD-10-CM | POA: Diagnosis not present

## 2015-04-25 DIAGNOSIS — H43813 Vitreous degeneration, bilateral: Secondary | ICD-10-CM | POA: Diagnosis not present

## 2015-04-25 MED ORDER — SODIUM CHLORIDE 0.9 % IV SOLN
INTRAVENOUS | Status: DC
  Administered 2015-04-26: 13:00:00 via INTRAVENOUS

## 2015-04-26 ENCOUNTER — Ambulatory Visit (HOSPITAL_COMMUNITY)
Admission: RE | Admit: 2015-04-26 | Discharge: 2015-04-26 | Disposition: A | Discharge status: Home / Self Care | Payer: Medicare Other | Source: Ambulatory Visit | Attending: Internal Medicine | Admitting: Internal Medicine

## 2015-04-26 ENCOUNTER — Encounter (HOSPITAL_COMMUNITY): Payer: Self-pay | Admitting: Internal Medicine

## 2015-04-26 ENCOUNTER — Ambulatory Visit (HOSPITAL_COMMUNITY): Payer: Medicare Other | Admitting: Anesthesiology

## 2015-04-26 ENCOUNTER — Encounter (HOSPITAL_COMMUNITY)
Admission: RE | Disposition: A | Discharge status: Home / Self Care | Payer: Self-pay | Source: Ambulatory Visit | Attending: Internal Medicine

## 2015-04-26 DIAGNOSIS — R42 Dizziness and giddiness: Secondary | ICD-10-CM

## 2015-04-26 DIAGNOSIS — I4891 Unspecified atrial fibrillation: Secondary | ICD-10-CM | POA: Insufficient documentation

## 2015-04-26 DIAGNOSIS — I480 Paroxysmal atrial fibrillation: Secondary | ICD-10-CM | POA: Insufficient documentation

## 2015-04-26 DIAGNOSIS — I481 Persistent atrial fibrillation: Secondary | ICD-10-CM | POA: Diagnosis not present

## 2015-04-26 DIAGNOSIS — I4819 Other persistent atrial fibrillation: Secondary | ICD-10-CM

## 2015-04-26 HISTORY — PX: CARDIOVERSION: SHX1299

## 2015-04-26 LAB — POCT I-STAT 4, (NA,K, GLUC, HGB,HCT)
Glucose, Bld: 96 mg/dL (ref 65–99)
HCT: 38 % — ABNORMAL LOW (ref 39.0–52.0)
Hemoglobin: 12.9 g/dL — ABNORMAL LOW (ref 13.0–17.0)
Potassium: 3.9 mmol/L (ref 3.5–5.1)
Sodium: 140 mmol/L (ref 135–145)

## 2015-04-26 SURGERY — CARDIOVERSION
Anesthesia: General

## 2015-04-26 MED ORDER — PROPOFOL 10 MG/ML IV BOLUS
INTRAVENOUS | Status: DC | PRN
  Administered 2015-04-26: 135 mg via INTRAVENOUS

## 2015-04-26 NOTE — Discharge Instructions (Signed)
Electrical Cardioversion °Electrical cardioversion is the delivery of a jolt of electricity to change the rhythm of the heart. Sticky patches or metal paddles are placed on the chest to deliver the electricity from a device. This is done to restore a normal rhythm. A rhythm that is too fast or not regular keeps the heart from pumping well. °Electrical cardioversion is done in an emergency if:  °· There is low or no blood pressure as a result of the heart rhythm.   °· Normal rhythm must be restored as fast as possible to protect the brain and heart from further damage.   °· It may save a life. °Cardioversion may be done for heart rhythms that are not immediately life threatening, such as atrial fibrillation or flutter, in which:  °· The heart is beating too fast or is not regular.   °· Medicine to change the rhythm has not worked.   °· It is safe to wait in order to allow time for preparation. °· Symptoms of the abnormal rhythm are bothersome. °· The risk of stroke and other serious problems can be reduced. °LET YOUR HEALTH CARE PROVIDER KNOW ABOUT:  °· Any allergies you have. °· All medicines you are taking, including vitamins, herbs, eye drops, creams, and over-the-counter medicines. °· Previous problems you or members of your family have had with the use of anesthetics.   °· Any blood disorders you have.   °· Previous surgeries you have had.   °· Medical conditions you have. °RISKS AND COMPLICATIONS  °Generally, this is a safe procedure. However, problems can occur and include:  °· Breathing problems related to the anesthetic used. °· A blood clot that breaks free and travels to other parts of your body. This could cause a stroke or other problems. The risk of this is lowered by use of blood-thinning medicine (anticoagulant) prior to the procedure. °· Cardiac arrest (rare). °BEFORE THE PROCEDURE  °· You may have tests to detect blood clots in your heart and to evaluate heart function.  °· You may start taking  anticoagulants so your blood does not clot as easily.   °· Medicines may be given to help stabilize your heart rate and rhythm. °PROCEDURE °· You will be given medicine through an IV tube to reduce discomfort and make you sleepy (sedative).   °· An electrical shock will be delivered. °AFTER THE PROCEDURE °Your heart rhythm will be watched to make sure it does not change.  °Document Released: 09/19/2002 Document Revised: 02/13/2014 Document Reviewed: 04/13/2013 °ExitCare® Patient Information ©2015 [employer]. This information is not intended to replace advice given to you by your health care provider. Make sure you discuss any questions you have with your health care provider. ° °Electrical Cardioversion, Care After °Refer to this sheet in the next few weeks. These instructions provide you with information on caring for yourself after your procedure. Your health care provider may also give you more specific instructions. Your treatment has been planned according to current medical practices, but problems sometimes occur. Call your health care provider if you have any problems or questions after your procedure. °WHAT TO EXPECT AFTER THE PROCEDURE °After your procedure, it is typical to have the following sensations: °· Some redness on the skin where the shocks were delivered. If this is tender, a sunburn lotion or hydrocortisone cream may help. °· Possible return of an abnormal heart rhythm within hours or days after the procedure. °HOME CARE INSTRUCTIONS °· Take medicines only as directed by your health care provider. Be sure you understand how and when   to take your medicine. °· Learn how to feel your pulse and check it often. °· Limit your activity for 48 hours after the procedure or as directed by your health care provider. °· Avoid or minimize caffeine and other stimulants as directed by your health care provider. °SEEK MEDICAL CARE IF: °· You feel like your heart is beating too fast or your pulse is not  regular. °· You have any questions about your medicines. °· You have bleeding that will not stop. °SEEK IMMEDIATE MEDICAL CARE IF: °· You are dizzy or feel faint. °· It is hard to breathe or you feel short of breath. °· There is a change in discomfort in your chest. °· Your speech is slurred or you have trouble moving an arm or leg on one side of your body. °· You get a serious muscle cramp that does not go away. °· Your fingers or toes turn cold or blue. °Document Released: 07/20/2013 Document Revised: 02/13/2014 Document Reviewed: 07/20/2013 °ExitCare® Patient Information ©2015 [employer]. This information is not intended to replace advice given to you by your health care provider. Make sure you discuss any questions you have with your health care provider. ° °

## 2015-04-26 NOTE — Transfer of Care (Signed)
Immediate Anesthesia Transfer of Care Note  Patient: Maurice Golden  Procedure(s) Performed: Procedure(s): CARDIOVERSION ([name])  Patient Location: PACU and Endoscopy Unit  Anesthesia Type:General  Level of Consciousness: awake, alert , oriented and sedated  Airway & Oxygen Therapy: Patient Spontanous Breathing and Patient connected to nasal cannula oxygen  Post-op Assessment: Report given to RN, Post -op Vital signs reviewed and stable and Patient moving all extremities  Post vital signs: Reviewed/stable  Last Vitals:  Filed Vitals:   04/26/15 1403  BP:   Pulse:   Resp: 17    Complications: No apparent anesthesia complications

## 2015-04-26 NOTE — CV Procedure (Addendum)
    CARDIOVERSION NOTE  Procedure: Electrical Cardioversion Indications:  Atrial Fibrillation  Procedure Details:  Consent: Risks of procedure as well as the alternatives and risks of each were explained to the (patient/caregiver).  Consent for procedure obtained.  Time Out: Verified patient identification, verified procedure, site/side was marked, verified correct patient position, special equipment/implants available, medications/allergies/relevent history reviewed, required imaging and test results available.  Performed  Patient placed on cardiac monitor, pulse oximetry, supplemental oxygen as necessary.  Sedation given: Propofol per anesthesia Pacer pads placed anterior and posterior chest.  Cardioverted 3 time(s).  Cardioverted at 150J, 200J, 200J biphasic.  Impression: Findings: Post procedure EKG shows: Atrial Fibrillation Complications: None Patient did tolerate procedure well.  Plan: 1. Unsuccessful DCCV after 3 attempts. Consider switching to a more efficacious antiarrythmic agent or evaluation for repeat catheter ablation.  Time Spent Directly with the Patient:  45 minutes   [name], MD, [facility] Attending Cardiologist [facility]  [name] 04/26/2015, 2:00 PM

## 2015-04-26 NOTE — H&P (Signed)
     INTERVAL PROCEDURE H&P  History and Physical Interval Note:  04/26/2015 12:34 PM  Maurice Golden has presented today for their planned procedure. The various methods of treatment have been discussed with the patient and family. After consideration of risks, benefits and other options for treatment, the patient has consented to the procedure.  The patients' outpatient history has been reviewed, patient examined, and no change in status from most recent office note within the past 30 days. I have reviewed the patients' chart and labs and will proceed as planned. Questions were answered to the patient's satisfaction.   [name], MD, [facility] Attending Cardiologist [facility]  [name] 04/26/2015, 12:34 PM

## 2015-04-27 ENCOUNTER — Encounter (HOSPITAL_COMMUNITY): Payer: Self-pay | Admitting: Internal Medicine

## 2015-04-27 ENCOUNTER — Telehealth: Payer: Self-pay | Admitting: Internal Medicine

## 2015-04-27 NOTE — Telephone Encounter (Signed)
New message      Pt want to talk to a nurse----pt would not tell me what he wanted

## 2015-04-27 NOTE — Telephone Encounter (Signed)
Spoke with patient and moved his appointment up to [facility] as he is getting dizzy and not feeling well

## 2015-04-30 ENCOUNTER — Other Ambulatory Visit: Payer: Self-pay

## 2015-04-30 ENCOUNTER — Ambulatory Visit (HOSPITAL_COMMUNITY)
Admission: RE | Admit: 2015-04-30 | Discharge: 2015-04-30 | Disposition: A | Discharge status: Home / Self Care | Payer: Medicare Other | Source: Ambulatory Visit | Attending: Nurse Practitioner | Admitting: Nurse Practitioner

## 2015-04-30 ENCOUNTER — Encounter (HOSPITAL_COMMUNITY): Payer: Self-pay | Admitting: Nurse Practitioner

## 2015-04-30 VITALS — BP 138/82 | HR 79 | Ht 75.0 in | Wt 293.0 lb

## 2015-04-30 DIAGNOSIS — G4733 Obstructive sleep apnea (adult) (pediatric): Secondary | ICD-10-CM | POA: Insufficient documentation

## 2015-04-30 DIAGNOSIS — Z8249 Family history of ischemic heart disease and other diseases of the circulatory system: Secondary | ICD-10-CM | POA: Diagnosis not present

## 2015-04-30 DIAGNOSIS — I10 Essential (primary) hypertension: Secondary | ICD-10-CM | POA: Diagnosis not present

## 2015-04-30 DIAGNOSIS — I481 Persistent atrial fibrillation: Secondary | ICD-10-CM | POA: Diagnosis present

## 2015-04-30 DIAGNOSIS — Z833 Family history of diabetes mellitus: Secondary | ICD-10-CM | POA: Diagnosis not present

## 2015-04-30 DIAGNOSIS — Z79899 Other long term (current) drug therapy: Secondary | ICD-10-CM | POA: Diagnosis not present

## 2015-04-30 DIAGNOSIS — I4819 Other persistent atrial fibrillation: Secondary | ICD-10-CM

## 2015-04-30 MED ORDER — AMIODARONE HCL 200 MG PO TABS: 200.0000 mg | ORAL_TABLET | Freq: Two times a day (BID) | ORAL | Status: DC

## 2015-04-30 NOTE — Patient Instructions (Signed)
Your physician has recommended you make the following change in your medication:  1) stop multaq 2)start Amiodarone (on Wednesday evening) 200mg  twice a day.  Follow up with Dr. Johney Frame beginning of September -- scheduler will call and schedule the appointment.  Any issues call afib clinic [phone]

## 2015-05-01 ENCOUNTER — Encounter (HOSPITAL_COMMUNITY): Payer: Self-pay | Admitting: Nurse Practitioner

## 2015-05-01 NOTE — Progress Notes (Signed)
Patient ID: Maurice Golden, male   DOB: April 01, 1961, 54 y.o.   MRN: [id number]     Primary Care Physician: Cain Saupe, MD Referring Physician:   Antohny Samuels is a 54 y.o. male with a h/o afib ablation 01/26/14 and EFAF following abaltion.He was loaded on Amiodarone last June and then cardioverted 6-8 weeks later and maintained SR. Amiodarone was stopped in March with return of afib in June. He was seen by Dr. Graciela Husbands and started on Multaq and cardioverted 7/14 but had ERAF. He is symptomatic with dizziness with position change when he is in afib and doesn't feel himself with lack of energy. He is here today to discuss options to restore SR. He will be leaving to go to [place]  In one week and will not return until the end of August. He is not interested in another ablation. He does not want to pursue tikosyn. He would like to try amiodarone again. I reviewed Dr. Jenel Lucks notes and last June he was loaded on amiodarone 200 mg bid and then cardioverted in August. He states he tolerated the medicine well. I will stop multaq x 48 hours and then start amiodarone. He has been on the same dose of BB for years and did not experience any bradycardia with last loading of amiodarone.Taking xarelto without interruption.  Today, he denies symptoms of palpitations, chest pain, shortness of breath, orthopnea, PND, lower extremity edema,  presyncope, syncope, or neurologic sequela. Positive for dizziness and fatigue when in afib.The patient is tolerating medications without difficulties and is otherwise without complaint today.   Past Medical History  Diagnosis Date  . Paroxysmal atrial fibrillation   . Obesity   . ANEMIA   . BRADYCARDIA   . OSA (obstructive sleep apnea)   . Bradycardia   . Hypertension    Past Surgical History  Procedure Laterality Date  . Bilateral knee arthroscopy    . Appendectomy    . L eye cataract    . Tee without cardioversion  04/29/2012    Procedure: TRANSESOPHAGEAL ECHOCARDIOGRAM  (TEE);  Surgeon: Vesta Mixer, MD;  Location: [facility] ENDOSCOPY;  Service: Cardiovascular;  Laterality: [name];  changed from nish to nahser/dl  . Cardioversion  04/29/2012    Procedure: CARDIOVERSION;  Surgeon: Vesta Mixer, MD;  Location: [facility] ENDOSCOPY;  Service: Cardiovascular;  Laterality: [name];  . Cardioversion [name] 12/21/2013    Procedure: CARDIOVERSION;  Surgeon: Wendall Stade, MD;  Location: [facility] ENDOSCOPY;  Service: Cardiovascular;  Laterality: [name];  . Tee without cardioversion [name] 01/25/2014    Procedure: TRANSESOPHAGEAL ECHOCARDIOGRAM (TEE);  Surgeon: [name], MD;  Location: [facility] ENDOSCOPY;  Service: Cardiovascular;  Laterality: [name];  . Ablation  01/26/14    PVI by Dr Johney Frame  . Cardioversion [name] 05/30/2014    Procedure: CARDIOVERSION;  Surgeon: Laurey Morale, MD;  Location: [facility] ENDOSCOPY;  Service: Cardiovascular;  Laterality: [name];  . Atrial fibrillation ablation [name] 01/26/2014    Procedure: ATRIAL FIBRILLATION ABLATION;  Surgeon: Gardiner Rhyme, MD;  Location: [facility] CATH LAB;  Service: Cardiovascular;  Laterality: [name];  . Cardioversion [name] 04/26/2015    Procedure: CARDIOVERSION;  Surgeon: [name], MD;  Location: [facility] ENDOSCOPY;  Service: Cardiovascular;  Laterality: [name];    Current Outpatient Prescriptions  Medication Sig Dispense Refill  . amLODipine (NORVASC) 5 MG tablet Take 5 mg by mouth daily.     . metoprolol succinate (TOPROL-XL) 50 MG 24 hr tablet Take 2 tablets (100 mg total) by mouth daily. Take with  or immediately following a meal. 180 tablet 0  . multivitamin (ONE-A-DAY MEN'S) TABS tablet Take 1 tablet by mouth daily.    . rivaroxaban (XARELTO) 20 MG TABS tablet Take 1 tablet (20 mg total) by mouth daily with supper. 30 tablet 11  . amiodarone (PACERONE) 200 MG tablet Take 1 tablet (200 mg total) by mouth 2 (two) times daily. 60 tablet 3   No current facility-administered medications for this encounter.   Facility-Administered Medications Ordered in Other Encounters    Medication Dose Route Frequency Provider Last Rate Last Dose  . propofol (DIPRIVAN) 10 mg/mL bolus/IV push   Intravenous Anesthesia Intra-op Fransisca Kaufmann, CRNA   135 mg at 04/26/15 1349    Allergies  Allergen Reactions  . Hydrocodone     Severe itching  . Other     All narcotics - Severe itching     History   Social History  . Marital Status: Single    Spouse Name: [name]  . Number of Children: [name]  . Years of Education: [name]   Occupational History  . retired    Social History Main Topics  . Smoking status: Never Smoker   . Smokeless tobacco: Not on file  . Alcohol Use: No  . Drug Use: No  . Sexual Activity: Yes   Other Topics Concern  . Not on file   Social History Narrative   Lives with mother in [place].  Retired/ disabled from [employer].    Family History  Problem Relation Age of Onset  . Diabetes Mother   . Hypertension Mother   . Hypertension Father     ROS- All systems are reviewed and negative except as per the HPI above  Physical Exam: Filed Vitals:   04/30/15 1449  BP: 138/82  Pulse: 79  Height:  (1.905 m)  Weight: 293 lb (132.904 kg)    GEN- The patient is well appearing, alert and oriented x 3 today.   Head- normocephalic, atraumatic Eyes-  Sclera clear, conjunctiva pink Ears- hearing intact Oropharynx- clear Neck- supple, no JVP Lymph- no cervical lymphadenopathy Lungs- Clear to ausculation bilaterally, normal work of breathing Heart- irregular rate and rhythm, no murmurs, rubs or gallops, PMI not laterally displaced GI- soft, NT, ND, + BS Extremities- no clubbing, cyanosis, or edema MS- no significant deformity or atrophy Skin- no rash or lesion Psych- euthymic mood, full affect Neuro- strength and sensation are intact  EKG-Atrial fibrillation at 79 bpm, RAD IRBBB, QRS 94 ms, QTc 435 ms Epic records reviewed  Assessment and Plan: 1.Persistent symptomatic Afib Pt desires SR Failed  multaq and cardioversion with ERAF, hold  multaq x 48 hours and then start amiodarone 200 mg bid  He is aware of risks or drug due to previous use. He does not desire another ablation or Tikosyn at this point. He will be loaded x one month and then return for plans for cardioversion if drug has not converted pt. Continue xarelto  2. HTN Stable  3. OSA Use cpap  F/u with Dr. Johney Frame in 4-6 weeks and afib clinic as needed.

## 2015-05-03 NOTE — Anesthesia Preprocedure Evaluation (Signed)
Anesthesia Evaluation  Patient identified by MRN, date of birth, ID band Patient awake    Reviewed: Allergy & Precautions, NPO status , Patient's Chart, lab work & pertinent test results  Airway Mallampati: II  TM Distance: >3 FB Neck ROM: Full    Dental  (+) Teeth Intact   Pulmonary  breath sounds clear to auscultation        Cardiovascular hypertension, Rhythm:Irregular Rate:Normal     Neuro/Psych    GI/Hepatic   Endo/Other    Renal/GU      Musculoskeletal   Abdominal   Peds  Hematology   Anesthesia Other Findings   Reproductive/Obstetrics                             Anesthesia Physical Anesthesia Plan  ASA: III  Anesthesia Plan: General   Post-op Pain Management:    Induction: Intravenous  Airway Management Planned: Mask  Additional Equipment:   Intra-op Plan:   Post-operative Plan:   Informed Consent: I have reviewed the patients History and Physical, chart, labs and discussed the procedure including the risks, benefits and alternatives for the proposed anesthesia with the patient or authorized representative who has indicated his/her understanding and acceptance.     Plan Discussed with:   Anesthesia Plan Comments:         Anesthesia Quick Evaluation

## 2015-05-03 NOTE — Anesthesia Postprocedure Evaluation (Signed)
  Anesthesia Post-op Note  Patient: Maurice Golden  Procedure(s) Performed: Procedure(s): CARDIOVERSION ([name])  Patient Location: Endoscopy Unit  Anesthesia Type:General  Level of Consciousness: awake, alert  and oriented  Airway and Oxygen Therapy: Patient Spontanous Breathing  Post-op Pain: none  Post-op Assessment: Post-op Vital signs reviewed, Patient's Cardiovascular Status Stable, Respiratory Function Stable, Patent Airway and Pain level controlled              Post-op Vital Signs: stable  Last Vitals:  Filed Vitals:   04/27/15 0741  BP: 143/66  Pulse:   Resp:     Complications: No apparent anesthesia complications

## 2015-05-08 ENCOUNTER — Ambulatory Visit (HOSPITAL_COMMUNITY): Payer: Medicare Other | Admitting: Nurse Practitioner

## 2015-05-09 ENCOUNTER — Telehealth: Payer: Self-pay | Admitting: Internal Medicine

## 2015-05-09 NOTE — Telephone Encounter (Signed)
New message       Pt request to talk to Kelly---he would not tell me what he wanted

## 2015-05-09 NOTE — Telephone Encounter (Signed)
Called patient and let him know we don't have any samples

## 2015-05-30 IMAGING — CR DG CHEST 2V
2 series · 2 of 2 positions shown · non-contrast
Comparison: 08/07/2011

CLINICAL DATA: Chest pain and palpitations.

EXAM:
CHEST  2 VIEW

[w chest pa]
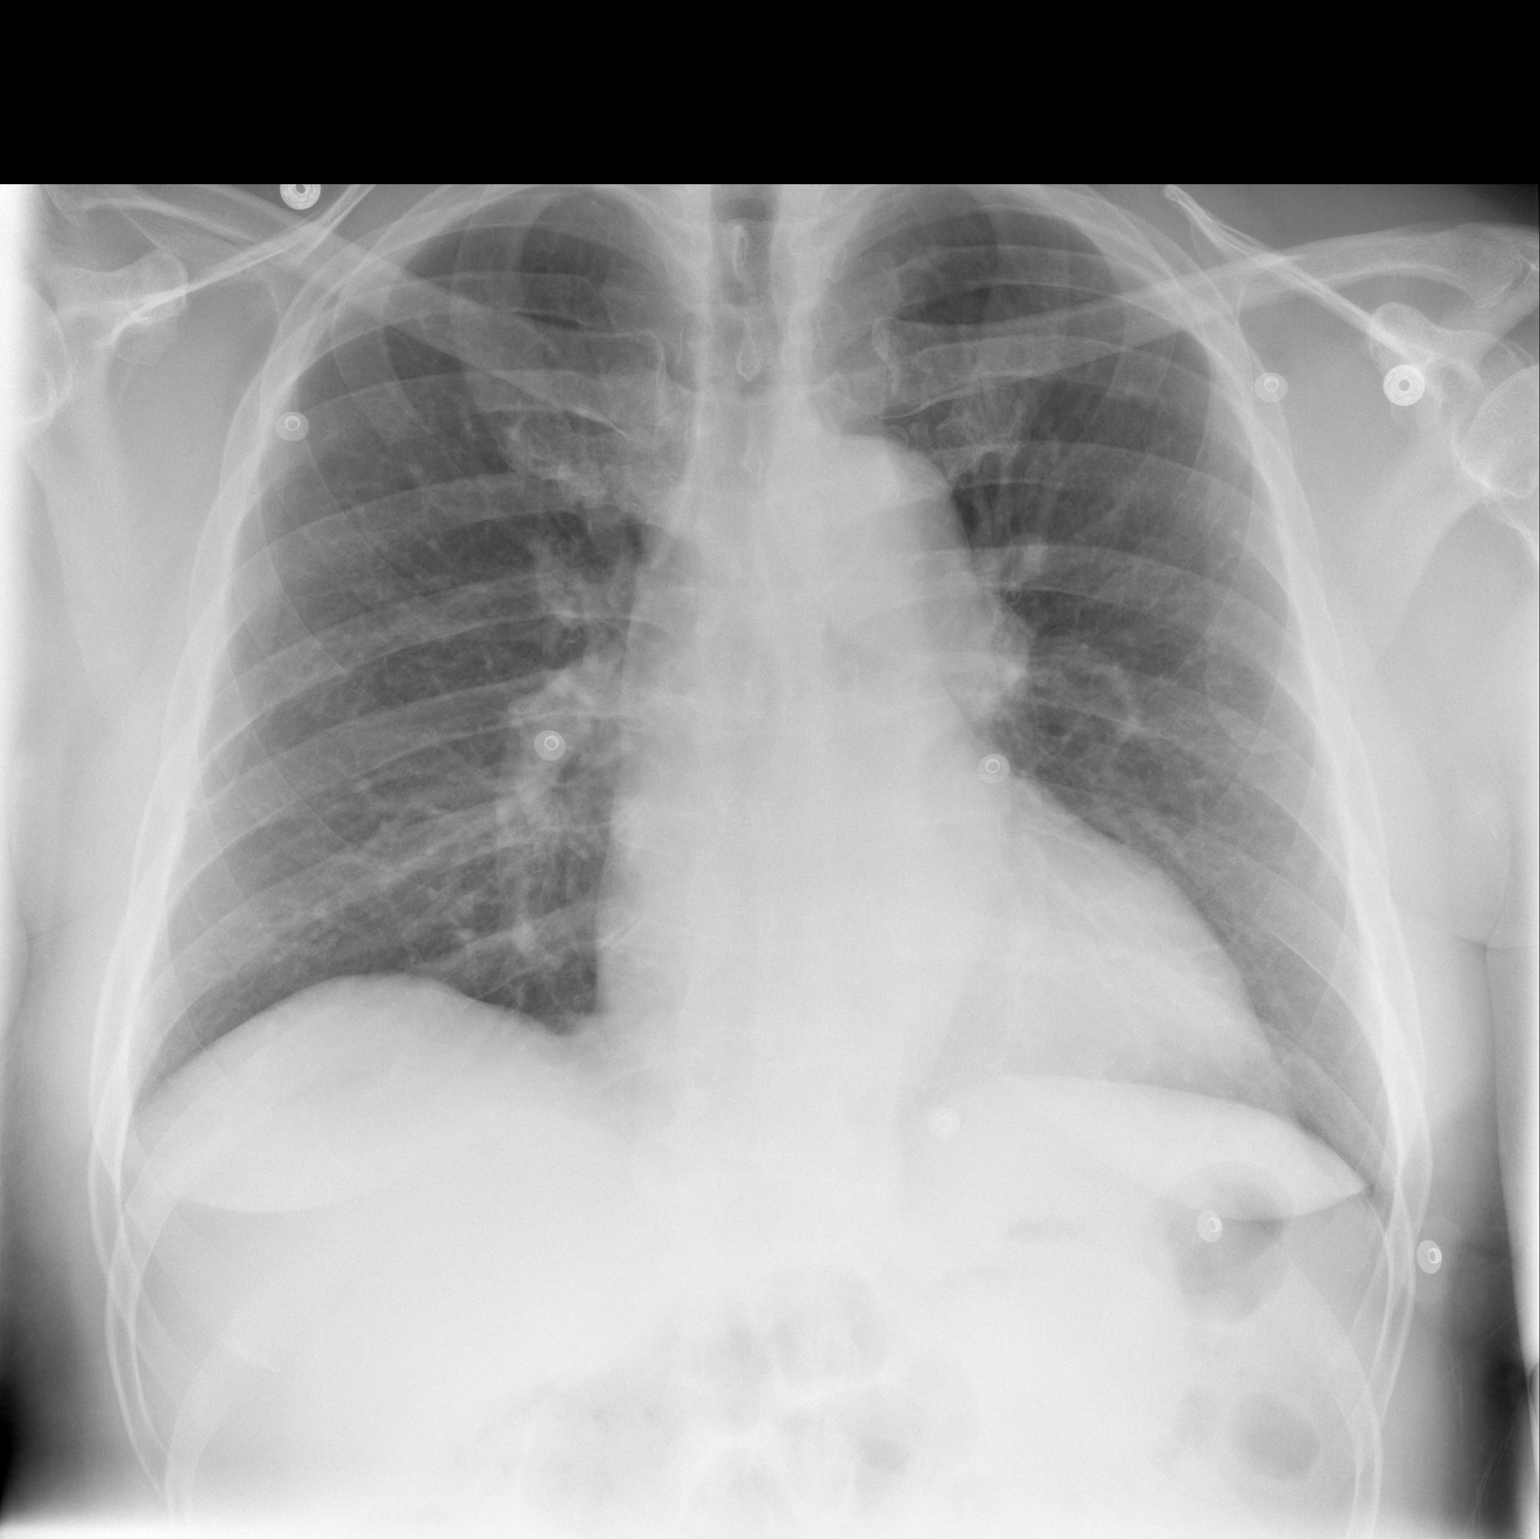

[w chest lat]
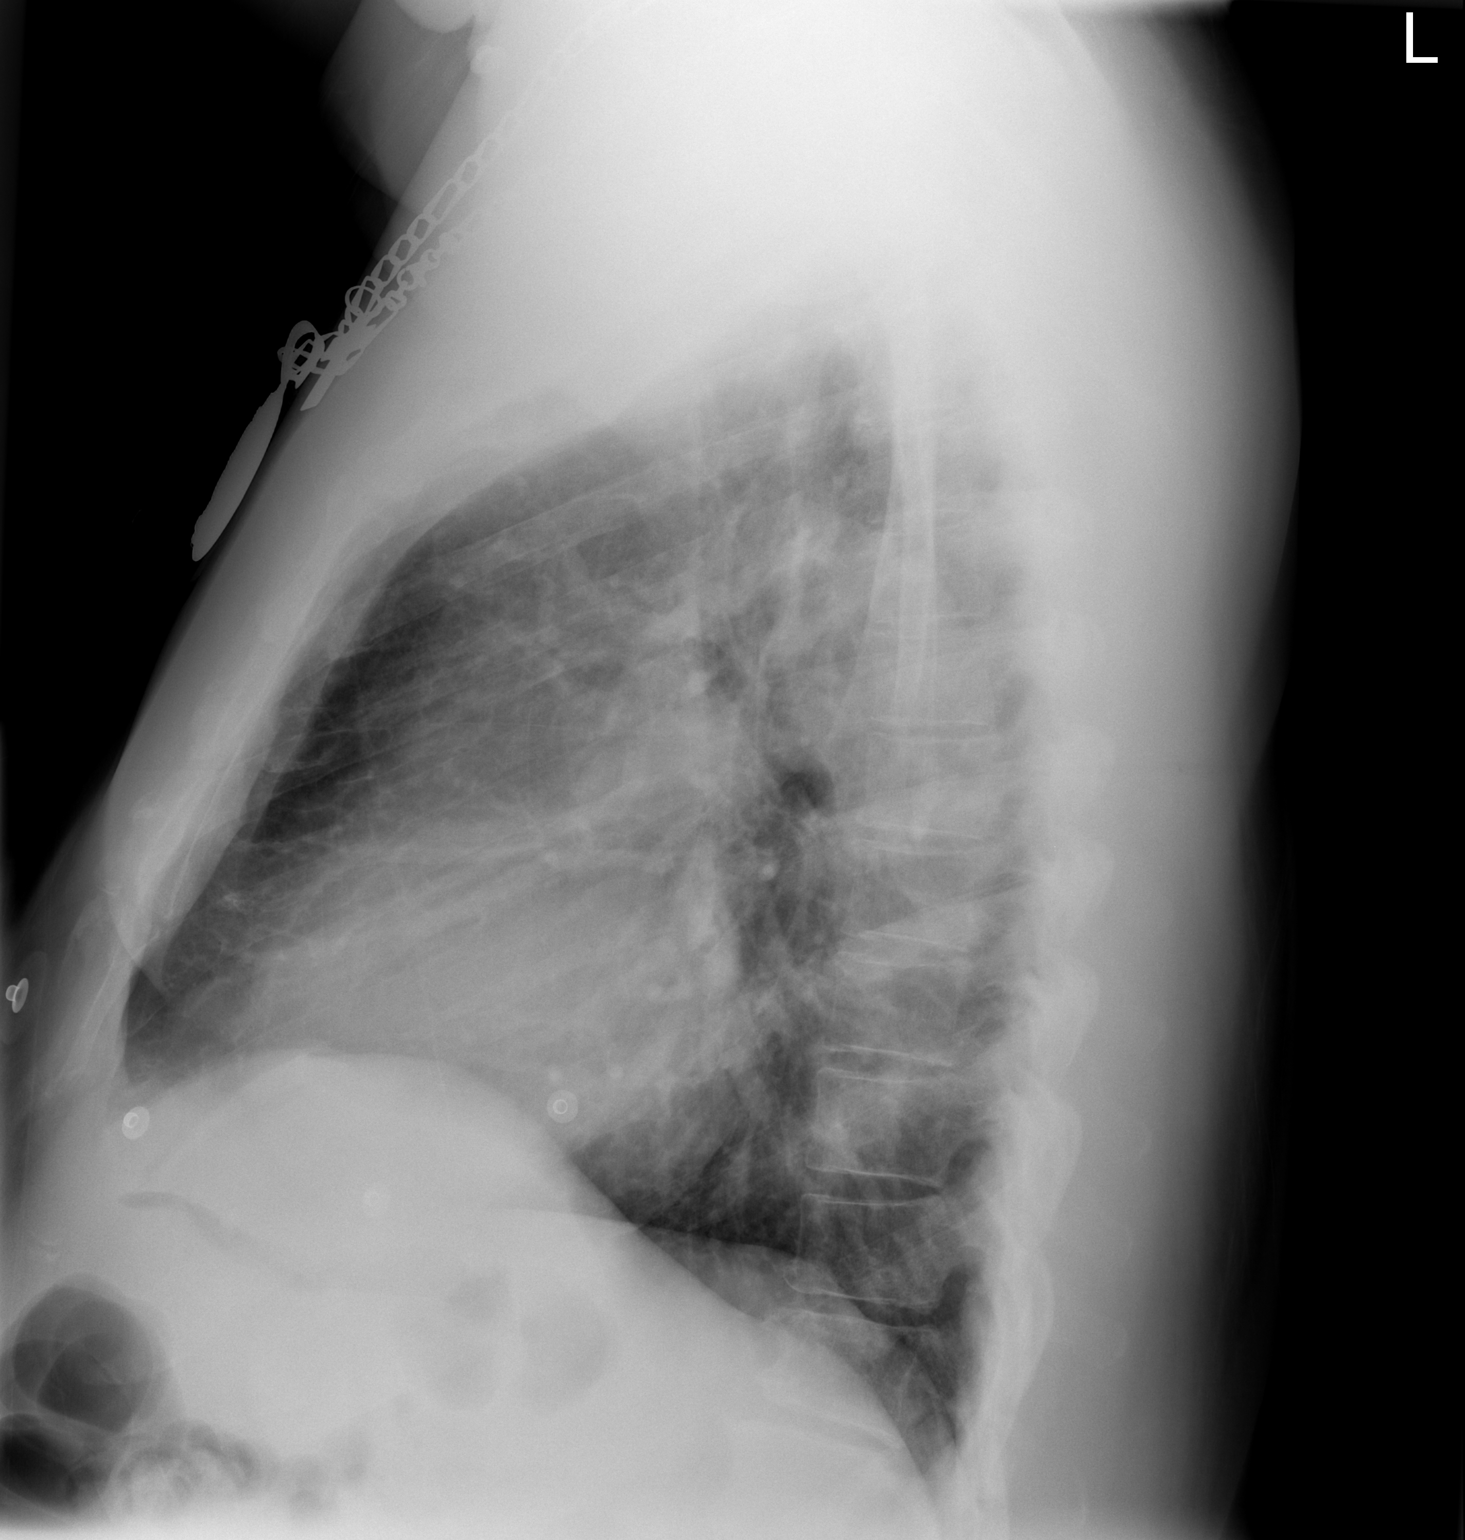

[2 of 2 positions shown; findings below may reference images not displayed]

FINDINGS: Lungs are adequately inflated without consolidation or effusion. The
cardiomediastinal silhouette and remainder of the exam is unchanged.
IMPRESSION: No active cardiopulmonary disease.

## 2015-06-06 ENCOUNTER — Telehealth: Payer: Self-pay | Admitting: Internal Medicine

## 2015-06-06 NOTE — Telephone Encounter (Signed)
New message    Pt needs to ask you a question;would not give any other information

## 2015-06-07 NOTE — Telephone Encounter (Signed)
Spoke with patient and changed his appointment to 06/29/15 at 2 pm per his request.  He is not having any problems

## 2015-06-12 IMAGING — CR DG CHEST 2V
2 series · 2 of 2 positions shown · non-contrast
Comparison: 09/14/2013

CLINICAL DATA: Chest discomfort today, recent atrial fibrillation

EXAM:
CHEST  2 VIEW

[w chest pa]
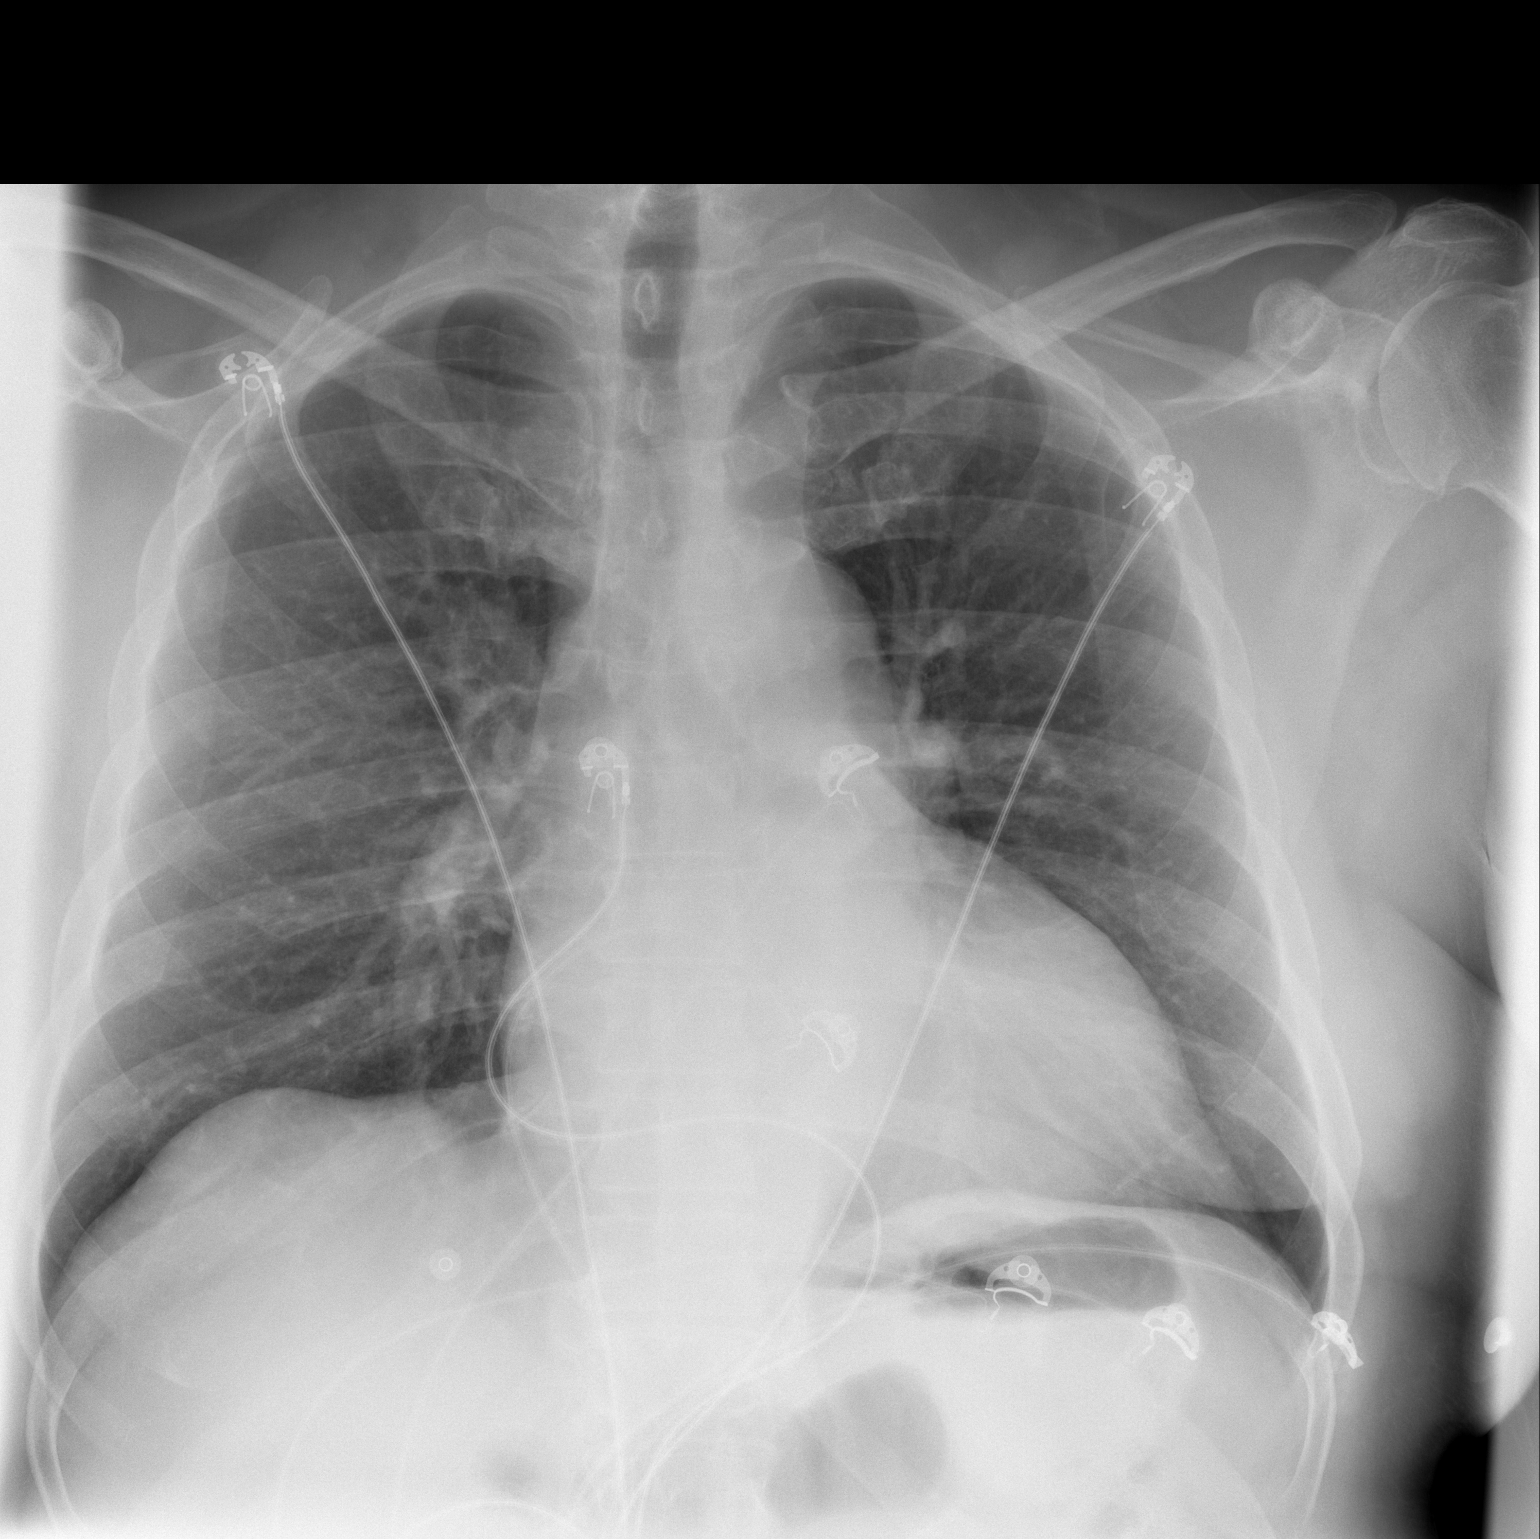

[w chest lat]
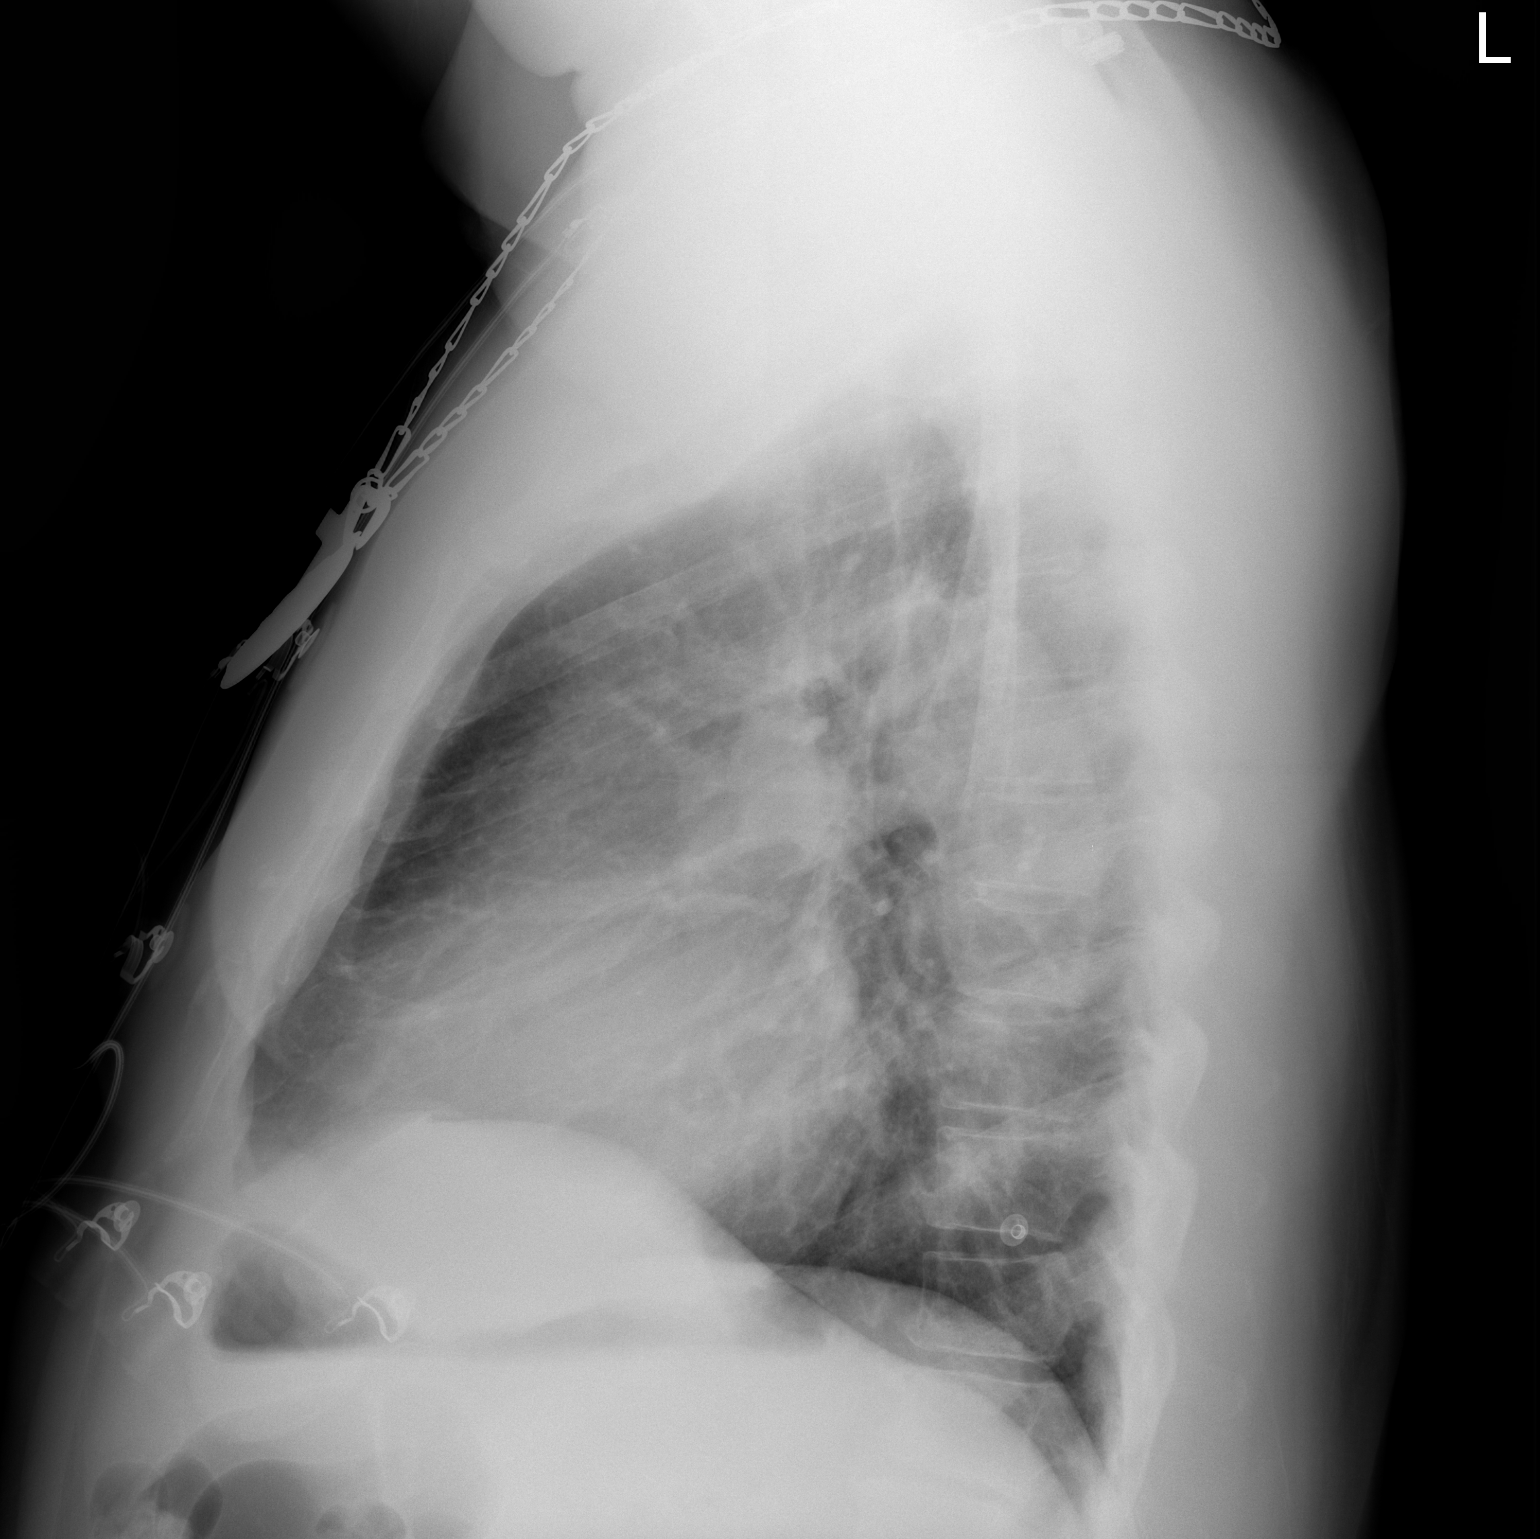

[2 of 2 positions shown; findings below may reference images not displayed]

FINDINGS: Mild enlargement of cardiac silhouette.

Tortuous aorta.

Mediastinal contours and pulmonary vascularity normal.

Lungs clear.

No pleural effusion or pneumothorax.

Bones unremarkable.
IMPRESSION: Mild enlargement of cardiac silhouette.

No acute abnormalities.

## 2015-06-20 ENCOUNTER — Ambulatory Visit: Payer: Medicare Other | Admitting: Internal Medicine

## 2015-06-23 ENCOUNTER — Other Ambulatory Visit (HOSPITAL_COMMUNITY): Payer: Self-pay | Admitting: Nurse Practitioner

## 2015-06-27 ENCOUNTER — Telehealth: Payer: Self-pay | Admitting: Internal Medicine

## 2015-06-27 NOTE — Telephone Encounter (Signed)
Samples of Xarelto?

## 2015-06-27 NOTE — Telephone Encounter (Signed)
New problem   Pt need you to call him and he wouldn't say why.

## 2015-06-28 ENCOUNTER — Encounter (HOSPITAL_COMMUNITY): Payer: Self-pay | Admitting: Internal Medicine

## 2015-06-29 ENCOUNTER — Ambulatory Visit: Payer: Medicare Other | Admitting: Internal Medicine

## 2015-07-03 ENCOUNTER — Telehealth: Payer: Self-pay | Admitting: Internal Medicine

## 2015-07-03 NOTE — Telephone Encounter (Signed)
Spoke with patient and he is going to come back next week and will pick up samples

## 2015-07-03 NOTE — Telephone Encounter (Signed)
New message    Pt has questions, would not leave any other information Please call to discuss

## 2015-07-06 ENCOUNTER — Telehealth: Payer: Self-pay | Admitting: Internal Medicine

## 2015-07-06 MED ORDER — RIVAROXABAN 20 MG PO TABS: 20.0000 mg | ORAL_TABLET | Freq: Every day | ORAL | Status: DC

## 2015-07-06 NOTE — Telephone Encounter (Signed)
New message      Pt request to talk to [place].  He would not tell me what he wanted

## 2015-07-06 NOTE — Telephone Encounter (Signed)
Rx for his Xarelto sent

## 2015-07-09 NOTE — Anesthesia Postprocedure Evaluation (Signed)
  Anesthesia Post-op Note  Patient: Maurice Golden  Procedure(s) Performed: Procedure(s): CARDIOVERSION ([name])  Patient Location: Endoscopy Unit  Anesthesia Type:General  Level of Consciousness: awake, alert  and oriented  Airway and Oxygen Therapy: Patient Spontanous Breathing  Post-op Pain: none  Post-op Assessment: Post-op Vital signs reviewed, Patient's Cardiovascular Status Stable, Respiratory Function Stable and Patent Airway              Post-op Vital Signs: stable  Last Vitals:  Filed Vitals:   04/27/15 0741  BP: 143/66  Pulse:   Resp:     Complications: No apparent anesthesia complications

## 2015-07-10 NOTE — Addendum Note (Signed)
Addendum  created 07/10/15 0014 by Kipp Brood, MD   Modules edited: Notes Section   Notes Section:  Pend: [phone]

## 2015-07-10 NOTE — Addendum Note (Signed)
Addendum  created 07/10/15 6160 by Kipp Brood, MD   Modules edited: Notes Section   Notes Section:  File: [id number]

## 2015-07-18 ENCOUNTER — Ambulatory Visit: Payer: Medicare Other | Admitting: Internal Medicine

## 2015-08-08 ENCOUNTER — Ambulatory Visit: Payer: Medicare Other | Admitting: Internal Medicine

## 2015-08-27 ENCOUNTER — Other Ambulatory Visit (HOSPITAL_COMMUNITY): Payer: Self-pay | Admitting: *Deleted

## 2015-08-27 MED ORDER — AMIODARONE HCL 200 MG PO TABS: ORAL_TABLET | ORAL | Status: DC

## 2015-08-31 DIAGNOSIS — H9202 Otalgia, left ear: Secondary | ICD-10-CM | POA: Diagnosis not present

## 2015-09-03 ENCOUNTER — Telehealth: Payer: Self-pay | Admitting: Internal Medicine

## 2015-09-03 NOTE — Telephone Encounter (Signed)
Called pt to inform him that we did not have any samples of the Xarelto 20 mg tablets and if he wanted to call back later in the week, that he was more than welcome to do so. Pt stated that he would just get a refill at his pharmacy. I advised the pt that if he has any other problems, questions or concerns to call our office. Pt verbalized understanding.

## 2015-09-03 NOTE — Telephone Encounter (Signed)
New message  ° ° °Patient calling the office for samples of medication: ° ° °1.  What medication and dosage are you requesting samples for? xarelto  20 mg  ° °2.  Are you currently out of this medication? Yes  ° ° °

## 2015-09-10 ENCOUNTER — Encounter: Payer: Self-pay | Admitting: Internal Medicine

## 2015-09-10 ENCOUNTER — Ambulatory Visit (INDEPENDENT_AMBULATORY_CARE_PROVIDER_SITE_OTHER): Payer: Medicare Other | Admitting: Internal Medicine

## 2015-09-10 VITALS — BP 136/84 | HR 60 | Ht 75.0 in | Wt 294.0 lb

## 2015-09-10 DIAGNOSIS — I10 Essential (primary) hypertension: Secondary | ICD-10-CM | POA: Diagnosis not present

## 2015-09-10 DIAGNOSIS — I4819 Other persistent atrial fibrillation: Secondary | ICD-10-CM

## 2015-09-10 DIAGNOSIS — G4733 Obstructive sleep apnea (adult) (pediatric): Secondary | ICD-10-CM

## 2015-09-10 DIAGNOSIS — I481 Persistent atrial fibrillation: Secondary | ICD-10-CM

## 2015-09-10 DIAGNOSIS — I4891 Unspecified atrial fibrillation: Secondary | ICD-10-CM | POA: Diagnosis not present

## 2015-09-10 LAB — CBC WITH DIFFERENTIAL/PLATELET
Basophils Absolute: 0 10*3/uL (ref 0.0–0.1)
Basophils Relative: 1 % (ref 0–1)
Eosinophils Absolute: 0 10*3/uL (ref 0.0–0.7)
Eosinophils Relative: 1 % (ref 0–5)
HCT: 37.9 % — ABNORMAL LOW (ref 39.0–52.0)
Hemoglobin: 13.1 g/dL (ref 13.0–17.0)
Lymphocytes Relative: 38 % (ref 12–46)
Lymphs Abs: 1.7 10*3/uL (ref 0.7–4.0)
MCH: 28.9 pg (ref 26.0–34.0)
MCHC: 34.6 g/dL (ref 30.0–36.0)
MCV: 83.5 fL (ref 78.0–100.0)
MPV: 8.4 fL — ABNORMAL LOW (ref 8.6–12.4)
Monocytes Absolute: 0.6 10*3/uL (ref 0.1–1.0)
Monocytes Relative: 14 % — ABNORMAL HIGH (ref 3–12)
Neutro Abs: 2.1 10*3/uL (ref 1.7–7.7)
Neutrophils Relative %: 46 % (ref 43–77)
Platelets: 308 10*3/uL (ref 150–400)
RBC: 4.54 MIL/uL (ref 4.22–5.81)
RDW: 13.6 % (ref 11.5–15.5)
WBC: 4.5 10*3/uL (ref 4.0–10.5)

## 2015-09-10 LAB — BASIC METABOLIC PANEL
BUN: 17 mg/dL (ref 7–25)
CO2: 27 mmol/L (ref 20–31)
Calcium: 9.4 mg/dL (ref 8.6–10.3)
Chloride: 101 mmol/L (ref 98–110)
Creat: 1.3 mg/dL (ref 0.70–1.33)
Glucose, Bld: 82 mg/dL (ref 65–99)
Potassium: 4 mmol/L (ref 3.5–5.3)
Sodium: 138 mmol/L (ref 135–146)

## 2015-09-10 LAB — TSH: TSH: 0.511 u[IU]/mL (ref 0.350–4.500)

## 2015-09-10 LAB — T4, FREE: Free T4: 1.47 ng/dL (ref 0.80–1.80)

## 2015-09-10 MED ORDER — AMIODARONE HCL 200 MG PO TABS: 200.0000 mg | ORAL_TABLET | Freq: Every day | ORAL | Status: DC

## 2015-09-10 NOTE — Patient Instructions (Signed)
Medication Instructions:  Your physician has recommended you make the following change in your medication:  1) Decrease Amiodarone to 200 mg daily   Labwork: Your physician recommends that you return for lab work today: TSH/T4/BMP/CBC   Testing/Procedures: None ordered   Follow-Up: Your physician wants you to follow-up in: 6 months with Dr Johney Frame Bonita Quin will receive a reminder letter in the mail two months in advance. If you don't receive a letter, please call our office to schedule the follow-up appointment.  Any Other Special Instructions Will Be Listed Below (If Applicable).     If you need a refill on your cardiac medications before your next appointment, please call your pharmacy.

## 2015-09-10 NOTE — Progress Notes (Signed)
PCP:  Maurice Saupe, MD  The patient presents today for electrophysiology followup.  He has done well since his last visit.  He has been in sinus rhythm with amiodarone.  Today, he denies symptoms of palpitations, chest pain, shortness of breath, orthopnea, PND, dizziness, presyncope, syncope, or neurologic sequela.  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  The patient feels that he is tolerating medications without difficulties and is otherwise without complaint today.   Past Medical History  Diagnosis Date  . Paroxysmal atrial fibrillation   . Obesity   . ANEMIA   . BRADYCARDIA   . OSA (obstructive sleep apnea)   . Bradycardia   . Hypertension    Past Surgical History  Procedure Laterality Date  . Bilateral knee arthroscopy    . Appendectomy    . L eye cataract    . Tee without cardioversion  04/29/2012    Procedure: TRANSESOPHAGEAL ECHOCARDIOGRAM (TEE);  Surgeon: Vesta Mixer, MD;  Location: [facility] ENDOSCOPY;  Service: Cardiovascular;  Laterality: [name];  changed from nish to nahser/dl  . Cardioversion  04/29/2012    Procedure: CARDIOVERSION;  Surgeon: Vesta Mixer, MD;  Location: [facility] ENDOSCOPY;  Service: Cardiovascular;  Laterality: [name];  . Cardioversion [name] 12/21/2013    Procedure: CARDIOVERSION;  Surgeon: Wendall Stade, MD;  Location: [facility] ENDOSCOPY;  Service: Cardiovascular;  Laterality: [name];  . Tee without cardioversion [name] 01/25/2014    Procedure: TRANSESOPHAGEAL ECHOCARDIOGRAM (TEE);  Surgeon: [name], MD;  Location: [facility] ENDOSCOPY;  Service: Cardiovascular;  Laterality: [name];  . Ablation  01/26/14    PVI by Dr Johney Frame  . Cardioversion [name] 05/30/2014    Procedure: CARDIOVERSION;  Surgeon: Laurey Morale, MD;  Location: [facility] ENDOSCOPY;  Service: Cardiovascular;  Laterality: [name];  . Atrial fibrillation ablation [name] 01/26/2014    Procedure: ATRIAL FIBRILLATION ABLATION;  Surgeon: Gardiner Rhyme, MD;  Location: [facility] CATH LAB;  Service: Cardiovascular;  Laterality: [name];     Current Outpatient Prescriptions  Medication Sig Dispense Refill  . amLODipine (NORVASC) 5 MG tablet Take 5 mg by mouth daily.     . metoprolol succinate (TOPROL-XL) 50 MG 24 hr tablet Take 2 tablets (100 mg total) by mouth daily. Take with or immediately following a meal. 180 tablet 0  . rivaroxaban (XARELTO) 20 MG TABS tablet Take 1 tablet (20 mg total) by mouth daily with supper. 30 tablet 11   No current facility-administered medications for this visit.    Allergies  Allergen Reactions  . Hydrocodone     Severe itching  . Other     All narcotics - Severe itching     History   Social History  . Marital Status: Single    Spouse Name: [name]  . Number of Children: [name]  . Years of Education: [name]   Occupational History  . retired    Social History Main Topics  . Smoking status: Never Smoker   . Smokeless tobacco: Not on file  . Alcohol Use: No  . Drug Use: No  . Sexual Activity: Yes   Other Topics Concern  . Not on file   Social History Narrative   Lives with mother in [place].  Retired/ disabled from [employer].    Family History  Problem Relation Age of Onset  . Diabetes Mother   . Hypertension Mother   . Hypertension Father     ROS-  All systems are reviewed and are negative except as outlined in the HPI above  Physical Exam:  Filed Vitals:   09/10/15 1435  BP: 136/84  Pulse: 60   GEN- The patient is well appearing, alert and oriented x 3 today.   Head- normocephalic, atraumatic Eyes-  Sclera clear, conjunctiva pink Ears- hearing intact Oropharynx- clear Neck- supple  Lungs- Clear to ausculation bilaterally, normal work of breathing Heart- RRR, no murmurs, rubs or gallops, PMI not laterally displaced GI- soft, NT, ND, + BS Extremities- no clubbing, cyanosis, trace edema Neuro- strength and sensation are intact  ekg reveals sinus rhythm 60 bpm, PR 258 msec, IVCD  Assessment and Plan:  1.  Persistent AF- Maintaining sinus rhythm  Reduce  amiodarone to 200mg  daily Check lfts/tfs today Consider reducing amiodarone to 100mg  daily upon return Continue xarleto Check bmet, cbc today  2. htn Stable No change required today  3. osa Compliance with CPAP is encouraged.  He says that he has been compliant  Return in 6 months

## 2015-11-06 ENCOUNTER — Telehealth: Payer: Self-pay | Admitting: Internal Medicine

## 2015-11-06 NOTE — Telephone Encounter (Signed)
I spoke with patient and offered to help him, pt states he wants to speak with Tresa Endo and is aware she is off today. Pt advised I will forward to [place].

## 2015-11-06 NOTE — Telephone Encounter (Signed)
New Message   Pt called states that he has a private question to ask. Please call back to discuss. ( request to have the message directed directly to nurse kelly)

## 2015-11-07 NOTE — Telephone Encounter (Signed)
Will place samples of Xarelto out front

## 2015-11-07 NOTE — Telephone Encounter (Signed)
F/u    Pt waiting on call back from [place].

## 2015-11-11 ENCOUNTER — Other Ambulatory Visit (HOSPITAL_COMMUNITY): Payer: Self-pay | Admitting: Nurse Practitioner

## 2015-12-10 ENCOUNTER — Telehealth: Payer: Self-pay | Admitting: Internal Medicine

## 2015-12-10 NOTE — Telephone Encounter (Signed)
Maurice Golden is asking you to give him a call. Did not want to disclose the content of the message .

## 2015-12-11 NOTE — Telephone Encounter (Signed)
Spoke with patient, samples are out front

## 2016-01-02 ENCOUNTER — Telehealth: Payer: Self-pay | Admitting: Internal Medicine

## 2016-01-02 NOTE — Telephone Encounter (Signed)
New Message   No further details// would like to speak with nurse kelly

## 2016-01-03 NOTE — Telephone Encounter (Signed)
Samples outfront 

## 2016-01-07 ENCOUNTER — Ambulatory Visit: Payer: Medicare Other | Admitting: Pulmonary Disease

## 2016-02-20 ENCOUNTER — Telehealth: Payer: Self-pay | Admitting: Internal Medicine

## 2016-02-20 NOTE — Telephone Encounter (Signed)
NeW Message  Pt requested to speak w/ RN- would not  Specify nature of phone call. Please call back and discuss.

## 2016-02-21 NOTE — Telephone Encounter (Signed)
Called patient and he has an appointment on Mon.  Will keep as scheduled.

## 2016-02-25 ENCOUNTER — Encounter: Payer: Self-pay | Admitting: Internal Medicine

## 2016-02-25 ENCOUNTER — Ambulatory Visit (INDEPENDENT_AMBULATORY_CARE_PROVIDER_SITE_OTHER): Payer: Medicare Other | Admitting: Internal Medicine

## 2016-02-25 VITALS — BP 146/88 | HR 58 | Ht 75.0 in | Wt 301.2 lb

## 2016-02-25 DIAGNOSIS — I481 Persistent atrial fibrillation: Secondary | ICD-10-CM

## 2016-02-25 DIAGNOSIS — I10 Essential (primary) hypertension: Secondary | ICD-10-CM | POA: Diagnosis not present

## 2016-02-25 DIAGNOSIS — I4819 Other persistent atrial fibrillation: Secondary | ICD-10-CM

## 2016-02-25 LAB — HEPATIC FUNCTION PANEL
ALT: 12 U/L (ref 9–46)
AST: 17 U/L (ref 10–35)
Albumin: 3.9 g/dL (ref 3.6–5.1)
Alkaline Phosphatase: 59 U/L (ref 40–115)
Bilirubin, Direct: 0.1 mg/dL (ref <=0.2)
Indirect Bilirubin: 0.4 mg/dL (ref 0.2–1.2)
Total Bilirubin: 0.5 mg/dL (ref 0.2–1.2)
Total Protein: 6.9 g/dL (ref 6.1–8.1)

## 2016-02-25 LAB — BASIC METABOLIC PANEL
BUN: 14 mg/dL (ref 7–25)
CO2: 25 mmol/L (ref 20–31)
Calcium: 9.1 mg/dL (ref 8.6–10.3)
Chloride: 104 mmol/L (ref 98–110)
Creat: 1.08 mg/dL (ref 0.70–1.33)
Glucose, Bld: 91 mg/dL (ref 65–99)
Potassium: 3.8 mmol/L (ref 3.5–5.3)
Sodium: 140 mmol/L (ref 135–146)

## 2016-02-25 LAB — CBC WITH DIFFERENTIAL/PLATELET
Basophils Absolute: 50 cells/uL (ref 0–200)
Basophils Relative: 1 %
Eosinophils Absolute: 100 cells/uL (ref 15–500)
Eosinophils Relative: 2 %
HCT: 36 % — ABNORMAL LOW (ref 38.5–50.0)
Hemoglobin: 12.3 g/dL — ABNORMAL LOW (ref 13.2–17.1)
Lymphocytes Relative: 40 %
Lymphs Abs: 2000 cells/uL (ref 850–3900)
MCH: 28.6 pg (ref 27.0–33.0)
MCHC: 34.2 g/dL (ref 32.0–36.0)
MCV: 83.7 fL (ref 80.0–100.0)
MPV: 8.4 fL (ref 7.5–12.5)
Monocytes Absolute: 550 cells/uL (ref 200–950)
Monocytes Relative: 11 %
Neutro Abs: 2300 cells/uL (ref 1500–7800)
Neutrophils Relative %: 46 %
Platelets: 285 10*3/uL (ref 140–400)
RBC: 4.3 MIL/uL (ref 4.20–5.80)
RDW: 13.9 % (ref 11.0–15.0)
WBC: 5 10*3/uL (ref 3.8–10.8)

## 2016-02-25 LAB — TSH: TSH: 0.44 mIU/L (ref 0.40–4.50)

## 2016-02-25 MED ORDER — AMIODARONE HCL 200 MG PO TABS: 100.0000 mg | ORAL_TABLET | Freq: Every day | ORAL | Status: DC

## 2016-02-25 NOTE — Progress Notes (Signed)
PCP:  Cain Saupe, MD  The patient presents today for electrophysiology followup.  He has done well since his last visit.  He has been in sinus rhythm with amiodarone.   Today, he denies symptoms of palpitations, chest pain, shortness of breath, orthopnea, PND, dizziness, presyncope, syncope, or neurologic sequela.  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  The patient feels that he is tolerating medications without difficulties and is otherwise without complaint today.   Past Medical History  Diagnosis Date  . Paroxysmal atrial fibrillation   . Obesity   . ANEMIA   . BRADYCARDIA   . OSA (obstructive sleep apnea)   . Bradycardia   . Hypertension    Past Surgical History  Procedure Laterality Date  . Bilateral knee arthroscopy    . Appendectomy    . L eye cataract    . Tee without cardioversion  04/29/2012    Procedure: TRANSESOPHAGEAL ECHOCARDIOGRAM (TEE);  Surgeon: Vesta Mixer, MD;  Location: [facility] ENDOSCOPY;  Service: Cardiovascular;  Laterality: [name];  changed from nish to nahser/dl  . Cardioversion  04/29/2012    Procedure: CARDIOVERSION;  Surgeon: Vesta Mixer, MD;  Location: [facility] ENDOSCOPY;  Service: Cardiovascular;  Laterality: [name];  . Cardioversion [name] 12/21/2013    Procedure: CARDIOVERSION;  Surgeon: Wendall Stade, MD;  Location: [facility] ENDOSCOPY;  Service: Cardiovascular;  Laterality: [name];  . Tee without cardioversion [name] 01/25/2014    Procedure: TRANSESOPHAGEAL ECHOCARDIOGRAM (TEE);  Surgeon: [name], MD;  Location: [facility] ENDOSCOPY;  Service: Cardiovascular;  Laterality: [name];  . Ablation  01/26/14    PVI by Dr Johney Frame  . Cardioversion [name] 05/30/2014    Procedure: CARDIOVERSION;  Surgeon: Laurey Morale, MD;  Location: [facility] ENDOSCOPY;  Service: Cardiovascular;  Laterality: [name];  . Atrial fibrillation ablation [name] 01/26/2014    Procedure: ATRIAL FIBRILLATION ABLATION;  Surgeon: Gardiner Rhyme, MD;  Location: [facility] CATH LAB;  Service: Cardiovascular;  Laterality: [name];     Current outpatient prescriptions:  .  amiodarone (PACERONE) 200 MG tablet, Take 1 tablet (200 mg total) by mouth daily., Disp: 30 tablet, Rfl: 6 .  amLODipine (NORVASC) 5 MG tablet, Take 5 mg by mouth daily. , Disp: , Rfl:  .  metoprolol succinate (TOPROL-XL) 50 MG 24 hr tablet, Take 2 tablets (100 mg total) by mouth daily. Take with or immediately following a meal., Disp: 180 tablet, Rfl: 0 .  multivitamin (ONE-A-DAY MEN'S) TABS tablet, Take 1 tablet by mouth daily., Disp: , Rfl:  .  rivaroxaban (XARELTO) 20 MG TABS tablet, Take 1 tablet (20 mg total) by mouth daily with supper., Disp: 30 tablet, Rfl: 11   Allergies  Allergen Reactions  . Hydrocodone     Severe itching  . Other     All narcotics - Severe itching     History   Social History  . Marital Status: Single    Spouse Name: [name]  . Number of Children: [name]  . Years of Education: [name]   Occupational History  . retired    Social History Main Topics  . Smoking status: Never Smoker   . Smokeless tobacco: Not on file  . Alcohol Use: No  . Drug Use: No  . Sexual Activity: Yes   Other Topics Concern  . Not on file   Social History Narrative   Lives with mother in [place].  Retired/ disabled from [employer].    Family History  Problem Relation Age of Onset  . Diabetes Mother   .  Hypertension Mother   . Hypertension Father     ROS-  All systems are reviewed and are negative except as outlined in the HPI above  Physical Exam: Filed Vitals:   02/25/16 1631  BP: 146/88   GEN- The patient is well appearing, alert and oriented x 3 today.   Head- normocephalic, atraumatic Eyes-  Sclera clear, conjunctiva pink Ears- hearing intact Oropharynx- clear Neck- supple  Lungs- Clear to ausculation bilaterally, normal work of breathing Heart- RRR, no murmurs, rubs or gallops, PMI not laterally displaced GI- soft, NT, ND, + BS Extremities- no clubbing, cyanosis, trace edema Neuro- strength and sensation are  intact  ekg reveals sinus rhythm 58 bpm, PR , IVCD  Assessment and Plan:  1.  Persistent AF- Maintaining sinus rhythm  Reduce amiodarone to 100mg  daily Check lfts/tfs today Continue xarleto Check bmet, cbc today  2. htn Stable No change required today  3. osa Compliance with CPAP is encouraged.  He says that he has been compliant  4. Obesity Body mass index is 37.65 kg/(m^2). Weight loss advised  Return in 6 months  Hillis Range MD, [facility] 02/25/2016 5:23 PM

## 2016-02-25 NOTE — Patient Instructions (Signed)
Medication Instructions:  Your physician has recommended you make the following change in your medication:  1) Decrease Amiodarone to 100 mg daily   Labwork: Your physician recommends that you return for lab work today: TSH/LIVER/BMP/CBC   Testing/Procedures: None ordered   Follow-Up: Your physician wants you to follow-up in: 6 months with Dr Johney Frame Bonita Quin will receive a reminder letter in the mail two months in advance. If you don't receive a letter, please call our office to schedule the follow-up appointment.   Any Other Special Instructions Will Be Listed Below (If Applicable).     If you need a refill on your cardiac medications before your next appointment, please call your pharmacy.

## 2016-03-24 ENCOUNTER — Telehealth: Payer: Self-pay | Admitting: Internal Medicine

## 2016-03-24 NOTE — Telephone Encounter (Signed)
New message    The pt is wanting to ask the nurse question, the pt would not let me know what question.

## 2016-03-25 NOTE — Telephone Encounter (Signed)
Spoke with patient, he is out of his Xarelto.  I have left him some out front for him to pick up

## 2016-03-25 NOTE — Telephone Encounter (Signed)
Follow up ° ° ° ° ° °Pt is calling to talk to the nurse.  He would not tell me what he wanted °

## 2016-04-18 ENCOUNTER — Other Ambulatory Visit: Payer: Self-pay | Admitting: Internal Medicine

## 2016-04-18 NOTE — Telephone Encounter (Signed)
called to inform pt we left xarelto samples upfront, pt expressed understanding.

## 2016-04-18 NOTE — Telephone Encounter (Signed)
Patient calling the office for samples of medication:   1.  What medication and dosage are you requesting samples for?Xarelto  2.  Are you currently out of this medication? Have 2 left,need some today please

## 2016-05-16 ENCOUNTER — Telehealth: Payer: Self-pay | Admitting: Internal Medicine

## 2016-05-16 NOTE — Telephone Encounter (Signed)
Follow-up       The nurse is returning the, from yesterday

## 2016-05-16 NOTE — Telephone Encounter (Signed)
Spoke with pt. He is requesting samples of Xarelto. I told him I would leave samples at the front desk for him to pick up.  Xarelto 20 mg, Lot [id number], exp 11/19 left at front desk.

## 2016-06-11 ENCOUNTER — Telehealth: Payer: Self-pay | Admitting: Internal Medicine

## 2016-06-11 MED ORDER — RIVAROXABAN 20 MG PO TABS: 20.0000 mg | ORAL_TABLET | Freq: Every day | ORAL | 0 refills | Status: DC

## 2016-06-11 NOTE — Telephone Encounter (Signed)
Patient called requesting samples of xarelto. Patient advised that samples are available for pick up.

## 2016-06-11 NOTE — Telephone Encounter (Signed)
New Message  Pt voiced he needed to speak with nurse.  Asked pt for clarity and pt voiced again he just needed to speak to nurse.  Please follow up with pt. Thanks!

## 2016-07-08 ENCOUNTER — Telehealth: Payer: Self-pay | Admitting: Internal Medicine

## 2016-07-08 NOTE — Telephone Encounter (Signed)
Spoke with patient and he stated that he has already spoken with Tresa Endo today.

## 2016-07-08 NOTE — Telephone Encounter (Signed)
Need samples of Xarelto.

## 2016-07-17 ENCOUNTER — Telehealth: Payer: Self-pay | Admitting: Internal Medicine

## 2016-07-17 NOTE — Telephone Encounter (Signed)
Was sent 103/17(under media) Selena Batten will refax today

## 2016-07-17 NOTE — Telephone Encounter (Signed)
[facility] Clearance re-faxed to Dr. Jodi Geralds at [phone].

## 2016-07-17 NOTE — Telephone Encounter (Signed)
New message      Calling to check the status on the surgical clearance faxed a few days ago Request for surgical clearance:  What type of surgery is being performed? Rt total replacement When is this surgery scheduled?  Pending clearance Are there any medications that need to be held prior to surgery and how long? xarelto instructions and need cardiac clearance Name of physician performing surgery? Dr Jodi Geralds 1. What is your office phone and fax number? Fax [phone]

## 2016-07-18 ENCOUNTER — Other Ambulatory Visit: Payer: Self-pay | Admitting: Orthopedic Surgery

## 2016-08-26 ENCOUNTER — Telehealth: Payer: Self-pay

## 2016-08-26 NOTE — Telephone Encounter (Signed)
Patient here in office today, requesting samples of Xarelto. Xarelto 20 mg 3 bottles given. Has appointment 12/05.

## 2016-09-08 ENCOUNTER — Encounter: Payer: Self-pay | Admitting: Internal Medicine

## 2016-09-15 ENCOUNTER — Ambulatory Visit (INDEPENDENT_AMBULATORY_CARE_PROVIDER_SITE_OTHER): Payer: Medicare Other | Admitting: Internal Medicine

## 2016-09-15 ENCOUNTER — Encounter: Payer: Self-pay | Admitting: Internal Medicine

## 2016-09-15 DIAGNOSIS — I481 Persistent atrial fibrillation: Secondary | ICD-10-CM

## 2016-09-15 DIAGNOSIS — I4819 Other persistent atrial fibrillation: Secondary | ICD-10-CM

## 2016-09-15 MED ORDER — AMLODIPINE BESYLATE 5 MG PO TABS: 5.0000 mg | ORAL_TABLET | Freq: Every day | ORAL | 3 refills | Status: DC

## 2016-09-15 MED ORDER — AMIODARONE HCL 200 MG PO TABS: 100.0000 mg | ORAL_TABLET | Freq: Every day | ORAL | 3 refills | Status: DC

## 2016-09-15 MED ORDER — METOPROLOL SUCCINATE ER 50 MG PO TB24: 100.0000 mg | ORAL_TABLET | Freq: Every day | ORAL | 3 refills | Status: DC

## 2016-09-15 NOTE — Patient Instructions (Signed)

## 2016-09-15 NOTE — Progress Notes (Signed)
PCP:  Maurice Golden, CAMMIE, MD  The patient presents today for electrophysiology followup.  He has done well since his last visit.  He has been in sinus rhythm with amiodarone.   Today, he denies symptoms of palpitations, chest pain, shortness of breath, orthopnea, PND, dizziness, presyncope, syncope, or neurologic sequela.  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  He has plans for R knee surgery in January.  The patient feels that he is tolerating medications without difficulties and is otherwise without complaint today.   Past Medical History  Diagnosis Date  . Paroxysmal atrial fibrillation   . Obesity   . ANEMIA   . BRADYCARDIA   . OSA (obstructive sleep apnea)   . Bradycardia   . Hypertension    Past Surgical History  Procedure Laterality Date  . Bilateral knee arthroscopy    . Appendectomy    . L eye cataract    . Tee without cardioversion  04/29/2012    Procedure: TRANSESOPHAGEAL ECHOCARDIOGRAM (TEE);  Surgeon: Vesta MixerPhilip J Nahser, MD;  [facility];  Service: Cardiovascular;  Laterality: [name];  changed from nish to nahser/dl  . Cardioversion  04/29/2012    Procedure: CARDIOVERSION;  Surgeon: Vesta MixerPhilip J Nahser, MD;  [facility];  Service: Cardiovascular;  Laterality: [name];  . Cardioversion [name] 12/21/2013    Procedure: CARDIOVERSION;  Surgeon: Wendall StadePeter C Nishan, MD;  [facility];  Service: Cardiovascular;  Laterality: [name];  . Tee without cardioversion [name] 01/25/2014    Procedure: TRANSESOPHAGEAL ECHOCARDIOGRAM (TEE);  Surgeon: Chrystie NoseKenneth C. Hilty, MD;  [facility];  Service: Cardiovascular;  Laterality: [name];  . Ablation  01/26/14    PVI by Dr Johney FrameAllred  . Cardioversion [name] 05/30/2014    Procedure: CARDIOVERSION;  Surgeon: Laurey Moralealton S McLean, MD;  [facility];  Service: Cardiovascular;  Laterality: [name];  . Atrial fibrillation ablation [name] 01/26/2014    Procedure: ATRIAL FIBRILLATION ABLATION;  Surgeon: Gardiner RhymeJames D Kemonie Cutillo, MD;  Location: [facility] CATH LAB;   Service: Cardiovascular;  Laterality: [name];    Current Outpatient Prescriptions:  .  amiodarone (PACERONE) 200 MG tablet, Take 0.5 tablets (100 mg total) by mouth daily., Disp: 30 tablet, Rfl: 6 .  amLODipine (NORVASC) 5 MG tablet, Take 5 mg by mouth daily. , Disp: , Rfl:  .  metoprolol succinate (TOPROL-XL) 50 MG 24 hr tablet, Take 2 tablets (100 mg total) by mouth daily. Take with or immediately following a meal., Disp: 180 tablet, Rfl: 0 .  multivitamin (ONE-A-DAY MEN'S) TABS tablet, Take 1 tablet by mouth daily., Disp: , Rfl:  .  rivaroxaban (XARELTO) 20 MG TABS tablet, Take 1 tablet (20 mg total) by mouth daily with supper., Disp: 28 tablet, Rfl: 0   Allergies  Allergen Reactions  . Hydrocodone     Severe itching  . Other     All narcotics - Severe itching     History   Social History  . Marital Status: Single    Spouse Name: [name]  . Number of Children: [name]  . Years of Education: [name]   Occupational History  . retired    Social History Main Topics  . Smoking status: Never Smoker   . Smokeless tobacco: Not on file  . Alcohol Use: No  . Drug Use: No  . Sexual Activity: Yes   Other Topics Concern  . Not on file   Social History Narrative   [place]ives with mother in [place].  Retired/ disabled from [employer].    Family History  Problem  Relation Age of Onset  . Diabetes Mother   . Hypertension Mother   . Hypertension Father     ROS-  All systems are reviewed and are negative except as outlined in the HPI above  Physical Exam: Vitals:   09/15/16 1411  BP: 124/80  Pulse: 66   GEN- The patient is well appearing, alert and oriented x 3 today.   Head- normocephalic, atraumatic Eyes-  Sclera clear, conjunctiva pink Ears- hearing intact Oropharynx- clear Neck- supple  Lungs- Clear to ausculation bilaterally, normal work of breathing Heart- RRR, no murmurs, rubs or gallops, PMI not laterally displaced GI- soft, NT, ND, + BS Extremities- no clubbing, cyanosis,  trace edema Neuro- strength and sensation are intact  ekg reveals sinus rhythm 66 bpm, PR , nonspecific St/T changes  Assessment and Plan:  1.  Persistent AF- Maintaining sinus rhythm with amiodarone 100mg  daily Continue xarleto Would consider reducing amiodarone to QOD upon return Given upcoming knee surgery, I am reluctant to make this change today  2. htn Stable No change required today  3. osa Compliance with CPAP is encouraged.  He says that he has been compliant  4. Obesity Body mass index is 38.17 kg/m. Weight loss advised  5. preop Ok to proceed with knee surgery if medically indicated Would hold xarelto 24 hours prior to surgery and resume when able post operatively  Return in 3 months  Hillis Range MD, [facility] 09/15/2016 2:36 PM

## 2016-10-07 ENCOUNTER — Telehealth: Payer: Self-pay | Admitting: *Deleted

## 2016-10-07 ENCOUNTER — Other Ambulatory Visit: Payer: Self-pay | Admitting: *Deleted

## 2016-10-07 MED ORDER — RIVAROXABAN 20 MG PO TABS: 20.0000 mg | ORAL_TABLET | Freq: Every day | ORAL | 0 refills | Status: DC

## 2016-10-07 NOTE — Telephone Encounter (Signed)
pt here in office wanting xarelto, gave him 2 bottles and told him that he will ned to apply for PA, will send message to kelly.

## 2016-10-15 ENCOUNTER — Other Ambulatory Visit: Payer: Self-pay | Admitting: *Deleted

## 2016-10-15 MED ORDER — RIVAROXABAN 20 MG PO TABS: 20.0000 mg | ORAL_TABLET | Freq: Every day | ORAL | 3 refills | Status: DC

## 2016-10-20 ENCOUNTER — Ambulatory Visit (HOSPITAL_COMMUNITY)
Admission: RE | Admit: 2016-10-20 | Discharge: 2016-10-20 | Disposition: A | Discharge status: Home / Self Care | Payer: Medicare Other | Source: Ambulatory Visit | Attending: Orthopedic Surgery | Admitting: Orthopedic Surgery

## 2016-10-20 ENCOUNTER — Encounter (HOSPITAL_COMMUNITY): Payer: Self-pay

## 2016-10-20 ENCOUNTER — Encounter (HOSPITAL_COMMUNITY)
Admission: RE | Admit: 2016-10-20 | Discharge: 2016-10-20 | Disposition: A | Discharge status: Home / Self Care | Payer: Medicare Other | Source: Ambulatory Visit | Attending: Orthopedic Surgery | Admitting: Orthopedic Surgery

## 2016-10-20 DIAGNOSIS — Z01818 Encounter for other preprocedural examination: Secondary | ICD-10-CM

## 2016-10-20 DIAGNOSIS — Z01812 Encounter for preprocedural laboratory examination: Secondary | ICD-10-CM | POA: Insufficient documentation

## 2016-10-20 DIAGNOSIS — M1711 Unilateral primary osteoarthritis, right knee: Secondary | ICD-10-CM | POA: Diagnosis not present

## 2016-10-20 DIAGNOSIS — Z01810 Encounter for preprocedural cardiovascular examination: Secondary | ICD-10-CM | POA: Insufficient documentation

## 2016-10-20 HISTORY — DX: Unspecified osteoarthritis, unspecified site: M19.90

## 2016-10-20 LAB — ABO/RH: ABO/RH(D): A NEG

## 2016-10-20 LAB — CBC WITH DIFFERENTIAL/PLATELET
Basophils Absolute: 0 10*3/uL (ref 0.0–0.1)
Basophils Relative: 0 %
Eosinophils Absolute: 0.1 10*3/uL (ref 0.0–0.7)
Eosinophils Relative: 2 %
HCT: 37.2 % — ABNORMAL LOW (ref 39.0–52.0)
Hemoglobin: 12.7 g/dL — ABNORMAL LOW (ref 13.0–17.0)
Lymphocytes Relative: 34 %
Lymphs Abs: 1.6 10*3/uL (ref 0.7–4.0)
MCH: 28.4 pg (ref 26.0–34.0)
MCHC: 34.1 g/dL (ref 30.0–36.0)
MCV: 83.2 fL (ref 78.0–100.0)
Monocytes Absolute: 0.5 10*3/uL (ref 0.1–1.0)
Monocytes Relative: 10 %
Neutro Abs: 2.6 10*3/uL (ref 1.7–7.7)
Neutrophils Relative %: 54 %
Platelets: 279 10*3/uL (ref 150–400)
RBC: 4.47 MIL/uL (ref 4.22–5.81)
RDW: 13.5 % (ref 11.5–15.5)
WBC: 4.8 10*3/uL (ref 4.0–10.5)

## 2016-10-20 LAB — URINALYSIS, ROUTINE W REFLEX MICROSCOPIC
Bacteria, UA: NONE SEEN
Bilirubin Urine: NEGATIVE
Glucose, UA: NEGATIVE mg/dL
Hgb urine dipstick: NEGATIVE
Ketones, ur: NEGATIVE mg/dL
Nitrite: NEGATIVE
Protein, ur: NEGATIVE mg/dL
Specific Gravity, Urine: 1.019 (ref 1.005–1.030)
pH: 5 (ref 5.0–8.0)

## 2016-10-20 LAB — COMPREHENSIVE METABOLIC PANEL
ALT: 15 U/L — ABNORMAL LOW (ref 17–63)
AST: 21 U/L (ref 15–41)
Albumin: 3.7 g/dL (ref 3.5–5.0)
Alkaline Phosphatase: 61 U/L (ref 38–126)
Anion gap: 7 (ref 5–15)
BUN: 16 mg/dL (ref 6–20)
CO2: 26 mmol/L (ref 22–32)
Calcium: 9.4 mg/dL (ref 8.9–10.3)
Chloride: 106 mmol/L (ref 101–111)
Creatinine, Ser: 1.19 mg/dL (ref 0.61–1.24)
GFR calc Af Amer: >60 mL/min (ref >60)
GFR calc non Af Amer: >60 mL/min (ref >60)
Glucose, Bld: 107 mg/dL — ABNORMAL HIGH (ref 65–99)
Potassium: 3.9 mmol/L (ref 3.5–5.1)
Sodium: 139 mmol/L (ref 135–145)
Total Bilirubin: 0.7 mg/dL (ref 0.3–1.2)
Total Protein: 7.4 g/dL (ref 6.5–8.1)

## 2016-10-20 LAB — SURGICAL PCR SCREEN
MRSA, PCR: NEGATIVE
Staphylococcus aureus: NEGATIVE

## 2016-10-20 LAB — TYPE AND SCREEN
ABO/RH(D): A NEG
Antibody Screen: NEGATIVE

## 2016-10-20 LAB — PROTIME-INR
INR: 1.64
Prothrombin Time: 19.7 seconds — ABNORMAL HIGH (ref 11.4–15.2)

## 2016-10-20 LAB — APTT: aPTT: 35 seconds (ref 24–36)

## 2016-10-20 NOTE — Pre-Procedure Instructions (Signed)
Treshawn Pleva  10/20/2016      [id number] - [place], [place] - [address] AT [facility] [address] [place] [address] Phone: [phone] Fax: [phone]  [facility] DELIVERY - [place], [place] - [address] [place] [address] Phone: [phone] Fax: [phone]  [id number] - [place], [place] - [address] AT [facility] [address] [place] [address] Phone: [phone] Fax: [phone]    Your procedure is scheduled on   Monday  10/27/16  Report to [facility] Admitting at 1115 A.M.  Call this number if you have problems the morning of surgery:  [phone]   Remember:  Do not eat food or drink liquids after midnight.  Take these medicines the morning of surgery with A SIP OF WATER    AMIODARONE (PACERONE), AMLODIPINE (NORVASC), METOPROLOL (TOPROL)              (STOP 7 DAYS PRIOR TO SURGERY ASPIRIN OR ASPIRIN PRODUCTS, IBUPROFEN/ ADVIL/ MOTRIN, GOODY POWDERS ,BCS, HERBAL MEDICINES, MULTIVITAMIN, stop XARELTO  PER DR. ALLREDS INSTRUCTIONS)   Do not wear jewelry, make-up or nail polish.  Do not wear lotions, powders, or perfumes, or deoderant.  Do not shave 48 hours prior to surgery.  Men may shave face and neck.  Do not bring valuables to the hospital.  [facility] is not responsible for any belongings or valuables.  Contacts, dentures or bridgework may not be worn into surgery.  Leave your suitcase in the car.  After surgery it may be brought to your room.  For patients admitted to the hospital, discharge time will be determined by your treatment team.  Patients discharged the day of surgery will not be allowed to drive home.   Name and phone number of your driver:    Special instructions:  [facility] - Preparing for Surgery  Before surgery, you can play an important role.  Because skin is not sterile, your skin needs to be as free  of germs as possible.  You can reduce the number of germs on you skin by washing with CHG (chlorahexidine gluconate) soap before surgery.  CHG is an antiseptic cleaner which kills germs and bonds with the skin to continue killing germs even after washing.  Please DO NOT use if you have an allergy to CHG or antibacterial soaps.  If your skin becomes reddened/irritated stop using the CHG and inform your nurse when you arrive at Short Stay.  Do not shave (including legs and underarms) for at least 48 hours prior to the first CHG shower.  You may shave your face.  Please follow these instructions carefully:   1.  Shower with CHG Soap the night before surgery and the                                morning of Surgery.  2.  If you choose to wash your hair, wash your hair first as usual with your       normal shampoo.  3.  After you shampoo, rinse your hair and body thoroughly to remove the                      Shampoo.  4.  Use CHG as you would any other liquid soap.  You can apply chg directly  to the skin and wash gently with scrungie or a clean washcloth.  5.  Apply the CHG Soap to your body ONLY FROM THE NECK DOWN.        Do not use on open wounds or open sores.  Avoid contact with your eyes,       ears, mouth and genitals (private parts).  Wash genitals (private parts)       with your normal soap.  6.  Wash thoroughly, paying special attention to the area where your surgery        will be performed.  7.  Thoroughly rinse your body with warm water from the neck down.  8.  DO NOT shower/wash with your normal soap after using and rinsing off       the CHG Soap.  9.  Pat yourself dry with a clean towel.            10.  Wear clean pajamas.            11.  Place clean sheets on your bed the night of your first shower and do not        sleep with pets.  Day of Surgery  Do not apply any lotions/deoderants the morning of surgery.  Please wear clean clothes to the hospital/surgery  center.    Please read over the following fact sheets that you were given. Total Joint Packet and MRSA Information

## 2016-10-21 ENCOUNTER — Telehealth: Payer: Self-pay | Admitting: Internal Medicine

## 2016-10-21 NOTE — Telephone Encounter (Signed)
Please give me a call regarding stopping med for patient to have surgery.

## 2016-10-21 NOTE — Telephone Encounter (Signed)
Need to know when to stop Xarelto for upcoming surgery.

## 2016-10-21 NOTE — Telephone Encounter (Signed)
[facility] calling to hold Xarelto per [facility] for 72 hours, our surg clearance stated 24-48 hours-surgery date was changed by patient from October-pls put in [place] or fax [phone]

## 2016-10-21 NOTE — Progress Notes (Addendum)
Anesthesia Chart Review:  Pt is a 56 year old male scheduled for R TKA on 10/27/2016 with Jodi Geralds, MD.   - Cardiologist is Hillis Range, MD.   PMH includes:  PAF, HTN, anemia, OSA. Never smoker. BMI 38  Medications include: amiodarone, amlodipine, metoprolol, xarelto. Dr. Johney Frame has given ok to stop xarelto 24-48 hours before surgery.    Preoperative labs reviewed.  PT 19.7. Will recheck DOS.   CXR 10/20/16: There is no acute cardiopulmonary abnormality.  EKG 09/15/16: sinus rhythm.  Nonspecific ST/T changes.   Echo 03/29/15:  - Left ventricle: The cavity size was normal. Wall thickness was increased in a pattern of severe LVH. Systolic function was vigorous. The estimated ejection fraction was in the range of 65% to 70%. Wall motion was normal; there were no regional wall motion abnormalities. Doppler parameters are consistent with abnormal left ventricular relaxation (grade 1 diastolic dysfunction). The E/e&' ratio is between 8-15, suggesting, indeterminate LV filling pressure. - Aorta: Aortic root dimension: 43 mm (ED). Ascending aortic diameter: 40 mm (S). - Ascending aorta: The ascending aorta was mildly dilated. - Left atrium: The atrium was mildly dilated. - Right atrium: The atrium was mildly dilated. - Impressions: Compared to the prior study in 2013, there is little change. EF is at least 70%, severe LVH. The ascending aorta is dilated up to 4.3 cm, there is mild biatrial enlargement.  Rica Mast, FNP-BC [facility]/Anesthesiology Phone: [phone] 10/21/2016 4:51 PM

## 2016-10-21 NOTE — Telephone Encounter (Signed)
Spoke with patient and let him know to have surgeon send request over and we will address.  He will  Have them do so. He says they are requesting to hold Xarelto for 24 hours prior.

## 2016-10-22 NOTE — Telephone Encounter (Signed)
Per recommendations of drug manufacturer and also in accordance with ACC consensus statement on anticoagulation bridging, my recommendation is to hold xarelto for 24-48 hours prior to operation.  If surgical team feels that additional time off of anticoagulation is required, I will defer to their judgement.  Hillis Range MD, [facility] 10/22/2016 10:04 AM

## 2016-10-22 NOTE — Telephone Encounter (Signed)
Agree with Dr Johney Frame, this also matches our clinic protocol. Will fax updated clearance to [facility].

## 2016-10-22 NOTE — Telephone Encounter (Signed)
Please follow up with Dr Johney Frame since he previously cleared patient to hold for 24 hours per his last OV note and 24-48 hours per medical clearance. Would not want to approve 72 hour clearance without his approval given prior MD clearance instructions.

## 2016-10-24 MED ORDER — DEXTROSE 5 % IV SOLN
3.0000 g | INTRAVENOUS | Status: AC
  Administered 2016-10-27: 3 g via INTRAVENOUS
  Filled 2016-10-24: qty 3000

## 2016-10-27 ENCOUNTER — Encounter (HOSPITAL_COMMUNITY): Payer: Self-pay | Admitting: Certified Registered Nurse Anesthetist

## 2016-10-27 ENCOUNTER — Inpatient Hospital Stay (HOSPITAL_COMMUNITY)
Admission: RE | Admit: 2016-10-27 | Discharge: 2016-10-31 | DRG: 470 | Disposition: A | Discharge status: Home Health | Payer: Medicare Other | Source: Ambulatory Visit | Attending: Orthopedic Surgery | Admitting: Orthopedic Surgery

## 2016-10-27 ENCOUNTER — Inpatient Hospital Stay (HOSPITAL_COMMUNITY): Payer: Medicare Other | Admitting: Emergency Medicine

## 2016-10-27 ENCOUNTER — Encounter (HOSPITAL_COMMUNITY)
Admission: RE | Disposition: A | Discharge status: Home Health | Payer: Self-pay | Source: Ambulatory Visit | Attending: Orthopedic Surgery

## 2016-10-27 DIAGNOSIS — I480 Paroxysmal atrial fibrillation: Secondary | ICD-10-CM | POA: Diagnosis present

## 2016-10-27 DIAGNOSIS — I429 Cardiomyopathy, unspecified: Secondary | ICD-10-CM | POA: Diagnosis present

## 2016-10-27 DIAGNOSIS — Z833 Family history of diabetes mellitus: Secondary | ICD-10-CM | POA: Diagnosis not present

## 2016-10-27 DIAGNOSIS — G4733 Obstructive sleep apnea (adult) (pediatric): Secondary | ICD-10-CM | POA: Diagnosis present

## 2016-10-27 DIAGNOSIS — Z7901 Long term (current) use of anticoagulants: Secondary | ICD-10-CM

## 2016-10-27 DIAGNOSIS — M1711 Unilateral primary osteoarthritis, right knee: Secondary | ICD-10-CM | POA: Diagnosis present

## 2016-10-27 DIAGNOSIS — D62 Acute posthemorrhagic anemia: Secondary | ICD-10-CM

## 2016-10-27 DIAGNOSIS — Z8249 Family history of ischemic heart disease and other diseases of the circulatory system: Secondary | ICD-10-CM

## 2016-10-27 DIAGNOSIS — Z6838 Body mass index (BMI) 38.0-38.9, adult: Secondary | ICD-10-CM

## 2016-10-27 DIAGNOSIS — I10 Essential (primary) hypertension: Secondary | ICD-10-CM | POA: Diagnosis present

## 2016-10-27 HISTORY — PX: TOTAL KNEE ARTHROPLASTY: SHX125

## 2016-10-27 LAB — PROTIME-INR
INR: 1.07
Prothrombin Time: 13.9 seconds (ref 11.4–15.2)

## 2016-10-27 SURGERY — ARTHROPLASTY, KNEE, TOTAL
Anesthesia: Monitor Anesthesia Care | Site: Knee | Laterality: Right

## 2016-10-27 MED ORDER — AMIODARONE HCL 100 MG PO TABS
100.0000 mg | ORAL_TABLET | Freq: Every day | ORAL | Status: DC
  Administered 2016-10-28 – 2016-10-31 (×4): 100 mg via ORAL
  Filled 2016-10-27 (×4): qty 1

## 2016-10-27 MED ORDER — BUPIVACAINE HCL (PF) 0.5 % IJ SOLN
INTRAMUSCULAR | Status: AC
  Filled 2016-10-27: qty 30

## 2016-10-27 MED ORDER — DOCUSATE SODIUM 100 MG PO CAPS: 100.0000 mg | ORAL_CAPSULE | Freq: Two times a day (BID) | ORAL | 0 refills | Status: DC

## 2016-10-27 MED ORDER — FENTANYL CITRATE (PF) 100 MCG/2ML IJ SOLN
INTRAMUSCULAR | Status: DC | PRN
  Administered 2016-10-27 (×2): 50 ug via INTRAVENOUS

## 2016-10-27 MED ORDER — BUPIVACAINE HCL (PF) 0.5 % IJ SOLN
INTRAMUSCULAR | Status: DC | PRN
  Administered 2016-10-27: 30 mL

## 2016-10-27 MED ORDER — BUPIVACAINE LIPOSOME 1.3 % IJ SUSP
20.0000 mL | Freq: Once | INTRAMUSCULAR | Status: DC
  Filled 2016-10-27: qty 20

## 2016-10-27 MED ORDER — DOCUSATE SODIUM 100 MG PO CAPS
100.0000 mg | ORAL_CAPSULE | Freq: Two times a day (BID) | ORAL | Status: DC
  Administered 2016-10-27 – 2016-10-31 (×8): 100 mg via ORAL
  Filled 2016-10-27 (×8): qty 1

## 2016-10-27 MED ORDER — ONDANSETRON HCL 4 MG PO TABS: 4.0000 mg | ORAL_TABLET | Freq: Four times a day (QID) | ORAL | Status: DC | PRN

## 2016-10-27 MED ORDER — METHOCARBAMOL 500 MG PO TABS
ORAL_TABLET | ORAL | Status: AC
  Filled 2016-10-27: qty 1

## 2016-10-27 MED ORDER — CHLORHEXIDINE GLUCONATE 4 % EX LIQD: 60.0000 mL | Freq: Once | CUTANEOUS | Status: DC

## 2016-10-27 MED ORDER — TIZANIDINE HCL 2 MG PO TABS: 2.0000 mg | ORAL_TABLET | Freq: Three times a day (TID) | ORAL | 0 refills | Status: DC | PRN

## 2016-10-27 MED ORDER — MIDAZOLAM HCL 5 MG/5ML IJ SOLN
INTRAMUSCULAR | Status: DC | PRN
  Administered 2016-10-27 (×2): 1 mg via INTRAVENOUS

## 2016-10-27 MED ORDER — BUPIVACAINE LIPOSOME 1.3 % IJ SUSP
INTRAMUSCULAR | Status: DC | PRN
  Administered 2016-10-27: 20 mL

## 2016-10-27 MED ORDER — FENTANYL CITRATE (PF) 100 MCG/2ML IJ SOLN
INTRAMUSCULAR | Status: AC
  Filled 2016-10-27: qty 2

## 2016-10-27 MED ORDER — AMLODIPINE BESYLATE 5 MG PO TABS
5.0000 mg | ORAL_TABLET | Freq: Every day | ORAL | Status: DC
  Administered 2016-10-28 – 2016-10-31 (×4): 5 mg via ORAL
  Filled 2016-10-27 (×4): qty 1

## 2016-10-27 MED ORDER — MIDAZOLAM HCL 2 MG/2ML IJ SOLN
INTRAMUSCULAR | Status: AC
  Administered 2016-10-27: 2 mg
  Filled 2016-10-27: qty 2

## 2016-10-27 MED ORDER — ONDANSETRON HCL 4 MG/2ML IJ SOLN: 4.0000 mg | Freq: Once | INTRAMUSCULAR | Status: DC | PRN

## 2016-10-27 MED ORDER — 0.9 % SODIUM CHLORIDE (POUR BTL) OPTIME
TOPICAL | Status: DC | PRN
  Administered 2016-10-27: 1000 mL

## 2016-10-27 MED ORDER — BUPIVACAINE-EPINEPHRINE (PF) 0.5% -1:200000 IJ SOLN
INTRAMUSCULAR | Status: DC | PRN
  Administered 2016-10-27: 30 mL via PERINEURAL

## 2016-10-27 MED ORDER — MIDAZOLAM HCL 2 MG/2ML IJ SOLN
2.0000 mg | Freq: Once | INTRAMUSCULAR | Status: DC
  Filled 2016-10-27: qty 2

## 2016-10-27 MED ORDER — SODIUM CHLORIDE 0.9 % IV SOLN: INTRAVENOUS | Status: DC

## 2016-10-27 MED ORDER — HYDROMORPHONE HCL 2 MG/ML IJ SOLN
0.5000 mg | INTRAMUSCULAR | Status: DC | PRN
  Administered 2016-10-27: 1 mg via INTRAVENOUS
  Filled 2016-10-27: qty 1

## 2016-10-27 MED ORDER — FENTANYL CITRATE (PF) 100 MCG/2ML IJ SOLN
100.0000 ug | Freq: Once | INTRAMUSCULAR | Status: AC
  Administered 2016-10-27: 100 ug via INTRAVENOUS
  Filled 2016-10-27: qty 2

## 2016-10-27 MED ORDER — PROPOFOL 10 MG/ML IV BOLUS
INTRAVENOUS | Status: AC
  Filled 2016-10-27: qty 20

## 2016-10-27 MED ORDER — ACETAMINOPHEN 325 MG PO TABS
650.0000 mg | ORAL_TABLET | Freq: Four times a day (QID) | ORAL | Status: DC | PRN
  Administered 2016-10-29: 650 mg via ORAL
  Filled 2016-10-27 (×2): qty 2

## 2016-10-27 MED ORDER — METHOCARBAMOL 1000 MG/10ML IJ SOLN
500.0000 mg | Freq: Four times a day (QID) | INTRAVENOUS | Status: DC | PRN
  Filled 2016-10-27: qty 5

## 2016-10-27 MED ORDER — DEXAMETHASONE SODIUM PHOSPHATE 10 MG/ML IJ SOLN
10.0000 mg | Freq: Two times a day (BID) | INTRAMUSCULAR | Status: AC
  Administered 2016-10-27 – 2016-10-28 (×3): 10 mg via INTRAVENOUS
  Filled 2016-10-27 (×3): qty 1

## 2016-10-27 MED ORDER — HYDROMORPHONE HCL 1 MG/ML IJ SOLN: 0.5000 mg | INTRAMUSCULAR | Status: DC | PRN

## 2016-10-27 MED ORDER — RIVAROXABAN 20 MG PO TABS
20.0000 mg | ORAL_TABLET | Freq: Every day | ORAL | Status: DC
  Administered 2016-10-27 – 2016-10-30 (×4): 20 mg via ORAL
  Filled 2016-10-27 (×4): qty 1

## 2016-10-27 MED ORDER — SODIUM CHLORIDE 0.9 % IR SOLN
Status: DC | PRN
  Administered 2016-10-27: 3000 mL

## 2016-10-27 MED ORDER — GABAPENTIN 300 MG PO CAPS
300.0000 mg | ORAL_CAPSULE | Freq: Two times a day (BID) | ORAL | Status: DC
  Administered 2016-10-27 – 2016-10-31 (×8): 300 mg via ORAL
  Filled 2016-10-27 (×6): qty 1
  Filled 2016-10-27: qty 3
  Filled 2016-10-27 (×2): qty 1

## 2016-10-27 MED ORDER — ZOLPIDEM TARTRATE 5 MG PO TABS: 5.0000 mg | ORAL_TABLET | Freq: Every evening | ORAL | Status: DC | PRN

## 2016-10-27 MED ORDER — PROPOFOL 10 MG/ML IV BOLUS
INTRAVENOUS | Status: DC | PRN
  Administered 2016-10-27: 30 mg via INTRAVENOUS

## 2016-10-27 MED ORDER — OXYCODONE HCL 5 MG PO TABS
5.0000 mg | ORAL_TABLET | ORAL | Status: DC | PRN
  Administered 2016-10-27 – 2016-10-28 (×3): 10 mg via ORAL
  Filled 2016-10-27 (×2): qty 2

## 2016-10-27 MED ORDER — MAGNESIUM CITRATE PO SOLN: 1.0000 | Freq: Once | ORAL | Status: DC | PRN

## 2016-10-27 MED ORDER — OXYCODONE-ACETAMINOPHEN 5-325 MG PO TABS: 1.0000 | ORAL_TABLET | ORAL | 0 refills | Status: DC | PRN

## 2016-10-27 MED ORDER — OXYCODONE HCL 5 MG PO TABS
ORAL_TABLET | ORAL | Status: AC
  Filled 2016-10-27: qty 2

## 2016-10-27 MED ORDER — DIPHENHYDRAMINE HCL 12.5 MG/5ML PO ELIX: 12.5000 mg | ORAL_SOLUTION | ORAL | Status: DC | PRN

## 2016-10-27 MED ORDER — TRANEXAMIC ACID 1000 MG/10ML IV SOLN
1000.0000 mg | INTRAVENOUS | Status: AC
  Administered 2016-10-27: 1000 mg via INTRAVENOUS
  Filled 2016-10-27: qty 10

## 2016-10-27 MED ORDER — ALUM & MAG HYDROXIDE-SIMETH 200-200-20 MG/5ML PO SUSP: 30.0000 mL | ORAL | Status: DC | PRN

## 2016-10-27 MED ORDER — ACETAMINOPHEN 650 MG RE SUPP: 650.0000 mg | Freq: Four times a day (QID) | RECTAL | Status: DC | PRN

## 2016-10-27 MED ORDER — BISACODYL 5 MG PO TBEC: 5.0000 mg | DELAYED_RELEASE_TABLET | Freq: Every day | ORAL | Status: DC | PRN

## 2016-10-27 MED ORDER — HYDROMORPHONE HCL 2 MG/ML IJ SOLN: 0.5000 mg | INTRAMUSCULAR | Status: DC | PRN

## 2016-10-27 MED ORDER — BUPIVACAINE IN DEXTROSE 0.75-8.25 % IT SOLN
INTRATHECAL | Status: DC | PRN
  Administered 2016-10-27: 15 mg via INTRATHECAL

## 2016-10-27 MED ORDER — ONDANSETRON HCL 4 MG/2ML IJ SOLN: 4.0000 mg | Freq: Four times a day (QID) | INTRAMUSCULAR | Status: DC | PRN

## 2016-10-27 MED ORDER — CEFAZOLIN SODIUM-DEXTROSE 2-4 GM/100ML-% IV SOLN
2.0000 g | Freq: Four times a day (QID) | INTRAVENOUS | Status: AC
Start: 2016-10-27 — End: 2016-10-28
  Administered 2016-10-27 – 2016-10-28 (×2): 2 g via INTRAVENOUS
  Filled 2016-10-27 (×2): qty 100

## 2016-10-27 MED ORDER — TRANEXAMIC ACID 1000 MG/10ML IV SOLN
1000.0000 mg | Freq: Once | INTRAVENOUS | Status: AC
  Administered 2016-10-27: 1000 mg via INTRAVENOUS
  Filled 2016-10-27: qty 10

## 2016-10-27 MED ORDER — FENTANYL CITRATE (PF) 100 MCG/2ML IJ SOLN
25.0000 ug | INTRAMUSCULAR | Status: DC | PRN
  Administered 2016-10-27 (×2): 50 ug via INTRAVENOUS

## 2016-10-27 MED ORDER — METHOCARBAMOL 500 MG PO TABS
500.0000 mg | ORAL_TABLET | Freq: Four times a day (QID) | ORAL | Status: DC | PRN
  Administered 2016-10-27 – 2016-10-30 (×2): 500 mg via ORAL
  Filled 2016-10-27: qty 1

## 2016-10-27 MED ORDER — POLYETHYLENE GLYCOL 3350 17 G PO PACK: 17.0000 g | PACK | Freq: Every day | ORAL | Status: DC | PRN

## 2016-10-27 MED ORDER — METOPROLOL SUCCINATE ER 100 MG PO TB24
100.0000 mg | ORAL_TABLET | Freq: Every day | ORAL | Status: DC
Start: 2016-10-28 — End: 2016-10-31
  Administered 2016-10-28 – 2016-10-31 (×4): 100 mg via ORAL
  Filled 2016-10-27 (×4): qty 1

## 2016-10-27 MED ORDER — MIDAZOLAM HCL 2 MG/2ML IJ SOLN
INTRAMUSCULAR | Status: AC
  Filled 2016-10-27: qty 2

## 2016-10-27 MED ORDER — LACTATED RINGERS IV SOLN
INTRAVENOUS | Status: DC
  Administered 2016-10-27: 13:00:00 via INTRAVENOUS

## 2016-10-27 MED ORDER — PROPOFOL 500 MG/50ML IV EMUL
INTRAVENOUS | Status: DC | PRN
  Administered 2016-10-27: 50 ug/kg/min via INTRAVENOUS

## 2016-10-27 SURGICAL SUPPLY — 60 items
BANDAGE ESMARK 6X9 LF (GAUZE/BANDAGES/DRESSINGS) ×1 IMPLANT
BENZOIN TINCTURE PRP APPL 2/3 (GAUZE/BANDAGES/DRESSINGS) ×2 IMPLANT
BLADE SAGITTAL 25.0X1.19X90 (BLADE) ×2 IMPLANT
BLADE SAW SAG 90X13X1.27 (BLADE) ×2 IMPLANT
BNDG ESMARK 6X9 LF (GAUZE/BANDAGES/DRESSINGS) ×2
BOWL SMART MIX CTS (DISPOSABLE) ×2 IMPLANT
CAPT KNEE TOTAL 3 ATTUNE ×2 IMPLANT
CEMENT HV SMART SET (Cement) ×4 IMPLANT
CLSR STERI-STRIP ANTIMIC 1/2X4 (GAUZE/BANDAGES/DRESSINGS) ×2 IMPLANT
COVER SURGICAL LIGHT HANDLE (MISCELLANEOUS) ×2 IMPLANT
CUFF TOURNIQUET SINGLE 34IN LL (TOURNIQUET CUFF) ×2 IMPLANT
CUFF TOURNIQUET SINGLE 44IN (TOURNIQUET CUFF) IMPLANT
DECANTER SPIKE VIAL GLASS SM (MISCELLANEOUS) ×2 IMPLANT
DRAPE EXTREMITY T 121X128X90 (DRAPE) ×2 IMPLANT
DRAPE U-SHAPE 47X51 STRL (DRAPES) ×2 IMPLANT
DRSG AQUACEL AG ADV 3.5X10 (GAUZE/BANDAGES/DRESSINGS) IMPLANT
DRSG PAD ABDOMINAL 8X10 ST (GAUZE/BANDAGES/DRESSINGS) ×2 IMPLANT
DURAPREP 26ML APPLICATOR (WOUND CARE) ×2 IMPLANT
ELECT CAUTERY BLADE 6.4 (BLADE) ×2 IMPLANT
ELECT PENCIL ROCKER SW 15FT (MISCELLANEOUS) ×2 IMPLANT
ELECT REM PT RETURN 9FT ADLT (ELECTROSURGICAL) ×2
ELECTRODE REM PT RTRN 9FT ADLT (ELECTROSURGICAL) ×1 IMPLANT
EVACUATOR 1/8 PVC DRAIN (DRAIN) IMPLANT
FACESHIELD WRAPAROUND (MASK) ×2 IMPLANT
GAUZE SPONGE 4X4 12PLY STRL (GAUZE/BANDAGES/DRESSINGS) ×2 IMPLANT
GLOVE BIOGEL PI IND STRL 8 (GLOVE) ×2 IMPLANT
GLOVE BIOGEL PI INDICATOR 8 (GLOVE) ×2
GLOVE ECLIPSE 7.5 STRL STRAW (GLOVE) ×4 IMPLANT
GOWN STRL REUS W/ TWL LRG LVL3 (GOWN DISPOSABLE) ×1 IMPLANT
GOWN STRL REUS W/ TWL XL LVL3 (GOWN DISPOSABLE) ×2 IMPLANT
GOWN STRL REUS W/TWL LRG LVL3 (GOWN DISPOSABLE) ×1
GOWN STRL REUS W/TWL XL LVL3 (GOWN DISPOSABLE) ×2
HANDPIECE INTERPULSE COAX TIP (DISPOSABLE) ×1
HOOD PEEL AWAY FACE SHEILD DIS (HOOD) ×4 IMPLANT
IMMOBILIZER KNEE 22 (SOFTGOODS) ×2 IMPLANT
IMMOBILIZER KNEE 22 UNIV (SOFTGOODS) ×2 IMPLANT
KIT BASIN OR (CUSTOM PROCEDURE TRAY) ×2 IMPLANT
KIT ROOM TURNOVER OR (KITS) ×2 IMPLANT
MANIFOLD NEPTUNE II (INSTRUMENTS) ×2 IMPLANT
NEEDLE 22X1 1/2 (OR ONLY) (NEEDLE) ×2 IMPLANT
NS IRRIG 1000ML POUR BTL (IV SOLUTION) ×2 IMPLANT
PACK TOTAL JOINT (CUSTOM PROCEDURE TRAY) ×2 IMPLANT
PAD ARMBOARD 7.5X6 YLW CONV (MISCELLANEOUS) ×4 IMPLANT
SET HNDPC FAN SPRY TIP SCT (DISPOSABLE) ×1 IMPLANT
STAPLER VISISTAT 35W (STAPLE) IMPLANT
STRIP CLOSURE SKIN 1/2X4 (GAUZE/BANDAGES/DRESSINGS) IMPLANT
SUCTION FRAZIER HANDLE 10FR (MISCELLANEOUS) ×1
SUCTION TUBE FRAZIER 10FR DISP (MISCELLANEOUS) ×1 IMPLANT
SUT MNCRL AB 3-0 PS2 18 (SUTURE) IMPLANT
SUT VIC AB 0 CTB1 27 (SUTURE) ×4 IMPLANT
SUT VIC AB 1 CT1 27 (SUTURE) ×2
SUT VIC AB 1 CT1 27XBRD ANBCTR (SUTURE) ×2 IMPLANT
SUT VIC AB 2-0 CTB1 (SUTURE) ×4 IMPLANT
SYR 50ML LL SCALE MARK (SYRINGE) ×2 IMPLANT
TOWEL OR 17X24 6PK STRL BLUE (TOWEL DISPOSABLE) ×2 IMPLANT
TOWEL OR 17X26 10 PK STRL BLUE (TOWEL DISPOSABLE) ×2 IMPLANT
TRAY CATH 16FR W/PLASTIC CATH (SET/KITS/TRAYS/PACK) ×2 IMPLANT
TRAY FOLEY CATH 16FRSI W/METER (SET/KITS/TRAYS/PACK) IMPLANT
WRAP KNEE MAXI GEL POST OP (GAUZE/BANDAGES/DRESSINGS) ×2 IMPLANT
YANKAUER SUCT BULB TIP NO VENT (SUCTIONS) ×2 IMPLANT

## 2016-10-27 NOTE — Progress Notes (Signed)
CPAP set up tonight on auto mode and pt nasal mask. tol well.

## 2016-10-27 NOTE — Progress Notes (Signed)
Orthopedic Tech Progress Note Patient Details:  Maurice Golden Jun 26, 1961 [id number]  CPM Right Knee CPM Right Knee: On Right Knee Flexion (Degrees): 60 Right Knee Extension (Degrees): 0 Additional Comments: patient exceeds weight for frame   [name] 10/27/2016, 6:02 PM

## 2016-10-27 NOTE — OR Nursing (Signed)
1705: in&out cath=150 cyu, somewhat concentrated, per protocol, no trauma

## 2016-10-27 NOTE — Transfer of Care (Signed)
Immediate Anesthesia Transfer of Care Note  Patient: [name]  Procedure(s) Performed: Procedure(s): TOTAL KNEE ARTHROPLASTY (Right)  Patient Location: PACU  Anesthesia Type:General  Level of Consciousness: awake  Airway & Oxygen Therapy: Patient Spontanous Breathing  Post-op Assessment: Report given to RN and Post -op Vital signs reviewed and stable  Post vital signs: Reviewed and stable  Last Vitals:  Vitals:   10/27/16 1724 10/27/16 1740  BP: (!) 176/113 (!) 183/117  Pulse: (!) 56 (!) 51  Resp: 16 15  Temp: 36.4 C     Last Pain:  Vitals:   10/27/16 1236  TempSrc:   PainSc: 2       Patients Stated Pain Goal: 3 (10/27/16 1236)  Complications: No apparent anesthesia complications

## 2016-10-27 NOTE — Anesthesia Preprocedure Evaluation (Addendum)
Anesthesia Evaluation  Patient identified by MRN, date of birth, ID band Patient awake    Reviewed: Allergy & Precautions, NPO status , Patient's Chart, lab work & pertinent test results  Airway Mallampati: II  TM Distance: >3 FB Neck ROM: Full    Dental  (+) Teeth Intact, Partial Upper, Partial Lower, Dental Advisory Given   Pulmonary    breath sounds clear to auscultation       Cardiovascular hypertension,  Rhythm:Regular Rate:Normal     Neuro/Psych    GI/Hepatic   Endo/Other    Renal/GU      Musculoskeletal   Abdominal (+) - obese,   Peds  Hematology   Anesthesia Other Findings   Reproductive/Obstetrics                           Anesthesia Physical Anesthesia Plan  ASA: III  Anesthesia Plan: Regional and General   Post-op Pain Management:    Induction: Intravenous  Airway Management Planned: Oral ETT  Additional Equipment:   Intra-op Plan:   Post-operative Plan: Extubation in OR  Informed Consent: I have reviewed the patients History and Physical, chart, labs and discussed the procedure including the risks, benefits and alternatives for the proposed anesthesia with the patient or authorized representative who has indicated his/her understanding and acceptance.   Dental advisory given  Plan Discussed with: CRNA and Anesthesiologist  Anesthesia Plan Comments: (Plan GA with oral ETT and adductor canal block)       Anesthesia Quick Evaluation

## 2016-10-27 NOTE — Brief Op Note (Signed)
10/27/2016  4:55 PM  PATIENT:  [name]  56 y.o. male  PRE-OPERATIVE DIAGNOSIS:  OSTEOARTHRITIS RIGHT KNEE  POST-OPERATIVE DIAGNOSIS:  OSTEOARTHRITIS RIGHT KNEE  PROCEDURE:  Procedure(s): TOTAL KNEE ARTHROPLASTY (Right)  SURGEON:  Surgeon(s) and Role:    * Jodi Geralds, MD - Primary  PHYSICIAN ASSISTANT:   ASSISTANTS: bethune   ANESTHESIA:   spinal  EBL:  No intake/output data recorded.  BLOOD ADMINISTERED:none  DRAINS: none   LOCAL MEDICATIONS USED:  MARCAINE    and OTHER experel  SPECIMEN:  No Specimen  DISPOSITION OF SPECIMEN:  [name]  COUNTS:  YES  TOURNIQUET:   Total Tourniquet Time Documented: Thigh (Right) - 73 minutes Total: Thigh (Right) - 73 minutes   DICTATION: .Other Dictation: Dictation Number [id number]  PLAN OF CARE: Admit to inpatient   PATIENT DISPOSITION:  PACU - hemodynamically stable.   Delay start of Pharmacological VTE agent (>24hrs) due to surgical blood loss or risk of bleeding: no

## 2016-10-27 NOTE — Anesthesia Postprocedure Evaluation (Addendum)
Anesthesia Post Note  Patient: Maurice Golden  Procedure(s) Performed: Procedure(s) (LRB): TOTAL KNEE ARTHROPLASTY (Right)  Patient location during evaluation: PACU Anesthesia Type: MAC Level of consciousness: awake and alert Pain management: pain level controlled Vital Signs Assessment: post-procedure vital signs reviewed and stable Respiratory status: spontaneous breathing and respiratory function stable Cardiovascular status: blood pressure returned to baseline and stable Postop Assessment: spinal receding Anesthetic complications: no       Last Vitals:  Vitals:   10/27/16 1724 10/27/16 1740  BP: (!) 176/113 (!) 183/117  Pulse: (!) 56 (!) 51  Resp: 16 15  Temp: 36.4 C     Last Pain:  Vitals:   10/27/16 1236  TempSrc:   PainSc: 2                  Tammee Thielke DANIEL

## 2016-10-27 NOTE — Anesthesia Procedure Notes (Signed)
Spinal  Patient location during procedure: OR Start time: 10/27/2016 2:33 PM End time: 10/27/2016 2:43 PM Staffing Anesthesiologist: Heather Roberts Performed: anesthesiologist  Preanesthetic Checklist Completed: patient identified, surgical consent, pre-op evaluation, timeout performed, IV checked, risks and benefits discussed and monitors and equipment checked Spinal Block Patient position: sitting Prep: DuraPrep Patient monitoring: cardiac monitor, continuous pulse ox and blood pressure Approach: midline Location: L2-3 Injection technique: single-shot Needle Needle type: Pencan  Needle gauge: 24 G Needle length: 9 cm Additional Notes Functioning IV was confirmed and monitors were applied. Sterile prep and drape, including hand hygiene and sterile gloves were used. The patient was positioned and the spine was prepped. The skin was anesthetized with lidocaine.  Free flow of clear CSF was obtained prior to injecting local anesthetic into the CSF.  The spinal needle aspirated freely following injection.  The needle was carefully withdrawn.  The patient tolerated the procedure well.

## 2016-10-27 NOTE — H&P (Signed)
TOTAL KNEE ADMISSION H&P  Patient is being admitted for right total knee arthroplasty.  Subjective:  Chief Complaint:right knee pain.  HPI: Maurice Golden, 56 y.o. male, has a history of pain and functional disability in the right knee due to arthritis and has failed non-surgical conservative treatments for greater than 12 weeks to includeNSAID's and/or analgesics, corticosteriod injections, viscosupplementation injections, use of assistive devices, weight reduction as appropriate and activity modification.  Onset of symptoms was gradual, starting 6 years ago with gradually worsening course since that time. The patient noted no past surgery on the right knee(s).  Patient currently rates pain in the right knee(s) at 8 out of 10 with activity. Patient has night pain, worsening of pain with activity and weight bearing, pain that interferes with activities of daily living, pain with passive range of motion and joint swelling.  Patient has evidence of subchondral sclerosis, periarticular osteophytes, joint subluxation and joint space narrowing by imaging studies. This patient has had failure of all reasonable conservative care. There is no active infection.  Patient Active Problem List   Diagnosis Date Noted  . Dizziness   . Persistent atrial fibrillation ([place])   . Cardiomyopathy ([place]) 01/25/2014  . Abnormal EKG 11/17/2011  . Atrial fibrillation ([place]) 09/10/2011  . Obstructive sleep apnea 02/05/2010  . OBESITY, MORBID 02/04/2010  . ANEMIA 02/04/2010  . Essential hypertension 02/04/2010  . BRADYCARDIA 02/04/2010   Past Medical History:  Diagnosis Date  . ANEMIA   . Arthritis   . BRADYCARDIA   . Bradycardia   . Dysrhythmia   . Hypertension   . Obesity   . OSA (obstructive sleep apnea)    cpap   . Paroxysmal atrial fibrillation [facility])     Past Surgical History:  Procedure Laterality Date  . ABLATION  01/26/14   PVI by Dr Rayann Heman  . APPENDECTOMY    . ATRIAL FIBRILLATION ABLATION [name]  01/26/2014   Procedure: ATRIAL FIBRILLATION ABLATION;  Surgeon: Coralyn Mark, MD;  Location: [place] CATH LAB;  Service: Cardiovascular;  Laterality: [name];  . BILATERAL KNEE ARTHROSCOPY    . CARDIOVERSION  04/29/2012   Procedure: CARDIOVERSION;  Surgeon: Thayer Headings, MD;  Location: [facility] ENDOSCOPY;  Service: Cardiovascular;  Laterality: [name];  . CARDIOVERSION [name] 12/21/2013   Procedure: CARDIOVERSION;  Surgeon: Josue Hector, MD;  Location: [facility] ENDOSCOPY;  Service: Cardiovascular;  Laterality: [name];  . CARDIOVERSION [name] 05/30/2014   Procedure: CARDIOVERSION;  Surgeon: Larey Dresser, MD;  Location: [place];  Service: Cardiovascular;  Laterality: [name];  . CARDIOVERSION [name] 04/26/2015   Procedure: CARDIOVERSION;  Surgeon: Pixie Casino, MD;  Location: [facility] ENDOSCOPY;  Service: Cardiovascular;  Laterality: [name];  . L eye cataract     bilat  . TEE WITHOUT CARDIOVERSION  04/29/2012   Procedure: TRANSESOPHAGEAL ECHOCARDIOGRAM (TEE);  Surgeon: Thayer Headings, MD;  Location: [place];  Service: Cardiovascular;  Laterality: [name];  changed from nish to nahser/dl  . TEE WITHOUT CARDIOVERSION [name] 01/25/2014   Procedure: TRANSESOPHAGEAL ECHOCARDIOGRAM (TEE);  Surgeon: Pixie Casino, MD;  Location: [facility] ENDOSCOPY;  Service: Cardiovascular;  Laterality: [name];    Prescriptions Prior to Admission  Medication Sig Dispense Refill Last Dose  . amiodarone (PACERONE) 200 MG tablet Take 0.5 tablets (100 mg total) by mouth daily. 45 tablet 3   . amLODipine (NORVASC) 5 MG tablet Take 1 tablet (5 mg total) by mouth daily. 90 tablet 3   . metoprolol succinate (TOPROL-XL) 50 MG 24 hr tablet Take 2 tablets (100 mg  total) by mouth daily. Take with or immediately following a meal. 180 tablet 3   . multivitamin (ONE-A-DAY MEN'S) TABS tablet Take 1 tablet by mouth daily.   Taking  . rivaroxaban (XARELTO) 20 MG TABS tablet Take 1 tablet (20 mg total) by mouth daily with supper. 90 tablet 3    Allergies  Allergen Reactions  .  Hydrocodone Itching    Severe itching  . Other Itching    All narcotics - Severe itching     Social History  Substance Use Topics  . Smoking status: Never Smoker  . Smokeless tobacco: Never Used  . Alcohol use No    Family History  Problem Relation Age of Onset  . Diabetes Mother   . Hypertension Mother   . Hypertension Father      ROS ROS: I have reviewed the patient's review of systems thoroughly and there are no positive responses as relates to the HPI. Objective:  Physical Exam  Vital signs in last 24 hours: Temp:  [98.9 F (37.2 C)] 98.9 F (37.2 C) (01/15 1143) Pulse Rate:  [65] 65 (01/15 1143) Resp:  [20] 20 (01/15 1143) BP: (198)/(95) 198/95 (01/15 1143) SpO2:  [99 %] 99 % (01/15 1143) Weight:  [138.8 kg (306 lb)] 138.8 kg (306 lb) (01/15 1143) Well-developed well-nourished patient in no acute distress. Alert and oriented x3 HEENT:within normal limits Cardiac: Regular rate and rhythm Pulmonary: Lungs clear to auscultation Abdomen: Soft and nontender.  Normal active bowel sounds  Musculoskeletal: (right knee: Painful range of motion.  Limited range of motion.  Trace effusion.  No instability. Labs: Recent Results (from the past 2160 hour(s))  Type and screen Order type and screen if day of surgery is less than 15 days from draw of preadmission visit or order morning of surgery if day of surgery is greater than 6 days from preadmission visit.     Status: None   Collection Time: 10/20/16  3:04 PM  Result Value Ref Range   ABO/RH(D) A NEG    Antibody Screen NEG    Sample Expiration 11/03/2016    Extend sample reason NO TRANSFUSIONS OR PREGNANCY IN THE PAST 3 MONTHS   Surgical pcr screen     Status: None   Collection Time: 10/20/16  3:05 PM  Result Value Ref Range   MRSA, PCR NEGATIVE NEGATIVE   Staphylococcus aureus NEGATIVE NEGATIVE    Comment:        The Xpert SA Assay (FDA approved for NASAL specimens in patients over 5 years of age), is one  component of a comprehensive surveillance program.  Test performance has been validated by [facility] for patients greater than or equal to 34 year old. It is not intended to diagnose infection nor to guide or monitor treatment.   APTT     Status: None   Collection Time: 10/20/16  3:05 PM  Result Value Ref Range   aPTT 35 24 - 36 seconds  CBC WITH DIFFERENTIAL     Status: Abnormal   Collection Time: 10/20/16  3:05 PM  Result Value Ref Range   WBC 4.8 4.0 - 10.5 K/uL   RBC 4.47 4.22 - 5.81 MIL/uL   Hemoglobin 12.7 (L) 13.0 - 17.0 g/dL   HCT 37.2 (L) 39.0 - 52.0 %   MCV 83.2 78.0 - 100.0 fL   MCH 28.4 26.0 - 34.0 pg   MCHC 34.1 30.0 - 36.0 g/dL   RDW 13.5 11.5 - 15.5 %   Platelets 279 150 -  400 K/uL   Neutrophils Relative % 54 %   Neutro Abs 2.6 1.7 - 7.7 K/uL   Lymphocytes Relative 34 %   Lymphs Abs 1.6 0.7 - 4.0 K/uL   Monocytes Relative 10 %   Monocytes Absolute 0.5 0.1 - 1.0 K/uL   Eosinophils Relative 2 %   Eosinophils Absolute 0.1 0.0 - 0.7 K/uL   Basophils Relative 0 %   Basophils Absolute 0.0 0.0 - 0.1 K/uL  Comprehensive metabolic panel     Status: Abnormal   Collection Time: 10/20/16  3:05 PM  Result Value Ref Range   Sodium 139 135 - 145 mmol/L   Potassium 3.9 3.5 - 5.1 mmol/L   Chloride 106 101 - 111 mmol/L   CO2 26 22 - 32 mmol/L   Glucose, Bld 107 (H) 65 - 99 mg/dL   BUN 16 6 - 20 mg/dL   Creatinine, Ser 1.19 0.61 - 1.24 mg/dL   Calcium 9.4 8.9 - 10.3 mg/dL   Total Protein 7.4 6.5 - 8.1 g/dL   Albumin 3.7 3.5 - 5.0 g/dL   AST 21 15 - 41 U/L   ALT 15 (L) 17 - 63 U/L   Alkaline Phosphatase 61 38 - 126 U/L   Total Bilirubin 0.7 0.3 - 1.2 mg/dL   GFR calc non Af Amer >60 >60 mL/min   GFR calc Af Amer >60 >60 mL/min    Comment: (NOTE) The eGFR has been calculated using the CKD EPI equation. This calculation has not been validated in all clinical situations. eGFR's persistently <60 mL/min signify possible Chronic Kidney Disease.    Anion gap 7 5 - 15   Protime-INR     Status: Abnormal   Collection Time: 10/20/16  3:05 PM  Result Value Ref Range   Prothrombin Time 19.7 (H) 11.4 - 15.2 seconds   INR 1.64   Urinalysis, Routine w reflex microscopic (not at [facility])     Status: Abnormal   Collection Time: 10/20/16  3:05 PM  Result Value Ref Range   Color, Urine YELLOW YELLOW   APPearance CLEAR CLEAR   Specific Gravity, Urine 1.019 1.005 - 1.030   pH 5.0 5.0 - 8.0   Glucose, UA NEGATIVE NEGATIVE mg/dL   Hgb urine dipstick NEGATIVE NEGATIVE   Bilirubin Urine NEGATIVE NEGATIVE   Ketones, ur NEGATIVE NEGATIVE mg/dL   Protein, ur NEGATIVE NEGATIVE mg/dL   Nitrite NEGATIVE NEGATIVE   Leukocytes, UA TRACE (A) NEGATIVE   RBC / HPF 0-5 0 - 5 RBC/hpf   WBC, UA 6-30 0 - 5 WBC/hpf   Bacteria, UA NONE SEEN NONE SEEN   Squamous Epithelial / LPF 0-5 (A) NONE SEEN   Mucous PRESENT    Hyaline Casts, UA PRESENT   ABO/Rh     Status: None   Collection Time: 10/20/16  3:18 PM  Result Value Ref Range   ABO/RH(D) A NEG     Estimated body mass index is 38.25 kg/m as calculated from the following:   Height as of 10/20/16: 6' 3"  (1.905 m).   Weight as of this encounter: 138.8 kg (306 lb).   Imaging Review Plain radiographs demonstrate severe degenerative joint disease of the right knee(s). The overall alignment ismild varus. The bone quality appears to be fair for age and reported activity level.  Assessment/Plan:  End stage arthritis, right knee   The patient history, physical examination, clinical judgment of the provider and imaging studies are consistent with end stage degenerative joint disease of the right knee(s)  and total knee arthroplasty is deemed medically necessary. The treatment options including medical management, injection therapy arthroscopy and arthroplasty were discussed at length. The risks and benefits of total knee arthroplasty were presented and reviewed. The risks due to aseptic loosening, infection, stiffness, patella tracking  problems, thromboembolic complications and other imponderables were discussed. The patient acknowledged the explanation, agreed to proceed with the plan and consent was signed. Patient is being admitted for inpatient treatment for surgery, pain control, PT, OT, prophylactic antibiotics, VTE prophylaxis, progressive ambulation and ADL's and discharge planning. The patient is planning to be discharged home with home health services

## 2016-10-27 NOTE — Anesthesia Procedure Notes (Signed)
Anesthesia Regional Block:  Adductor canal block  Pre-Anesthetic Checklist: ,, timeout performed, Correct Patient, Correct Site, Correct Laterality, Correct Procedure, Correct Position, site marked, Risks and benefits discussed,  Surgical consent,  Pre-op evaluation,  At surgeon's request and post-op pain management  Laterality: Right  Prep: chloraprep       Needles:  Injection technique: Single-shot  Needle Type: Stimulator Needle - 80     Needle Length: 10cm 10 cm Needle Gauge: 21 and 21 G    Additional Needles:  Procedures: ultrasound guided (picture in chart) Adductor canal block Narrative:  Start time: 10/27/2016 1:02 PM End time: 10/27/2016 1:13 PM Injection made incrementally with aspirations every 5 mL.  Performed by: Personally

## 2016-10-28 ENCOUNTER — Telehealth: Payer: Self-pay

## 2016-10-28 ENCOUNTER — Encounter (HOSPITAL_COMMUNITY): Payer: Self-pay

## 2016-10-28 LAB — CBC
HCT: 34.1 % — ABNORMAL LOW (ref 39.0–52.0)
Hemoglobin: 11.1 g/dL — ABNORMAL LOW (ref 13.0–17.0)
MCH: 27.4 pg (ref 26.0–34.0)
MCHC: 32.6 g/dL (ref 30.0–36.0)
MCV: 84.2 fL (ref 78.0–100.0)
Platelets: 256 10*3/uL (ref 150–400)
RBC: 4.05 MIL/uL — ABNORMAL LOW (ref 4.22–5.81)
RDW: 13.9 % (ref 11.5–15.5)
WBC: 9.3 10*3/uL (ref 4.0–10.5)

## 2016-10-28 LAB — BASIC METABOLIC PANEL
Anion gap: 8 (ref 5–15)
BUN: 20 mg/dL (ref 6–20)
CO2: 26 mmol/L (ref 22–32)
Calcium: 8.8 mg/dL — ABNORMAL LOW (ref 8.9–10.3)
Chloride: 101 mmol/L (ref 101–111)
Creatinine, Ser: 1.04 mg/dL (ref 0.61–1.24)
GFR calc Af Amer: >60 mL/min (ref >60)
GFR calc non Af Amer: >60 mL/min (ref >60)
Glucose, Bld: 203 mg/dL — ABNORMAL HIGH (ref 65–99)
Potassium: 3.9 mmol/L (ref 3.5–5.1)
Sodium: 135 mmol/L (ref 135–145)

## 2016-10-28 NOTE — Progress Notes (Signed)
Patient is alert, awake at present moment, medicated once for pain this shift with moderate relief, dressing dry and intact to right knee. CPAP on , no respiratory distress noted. Will continue to monitor.

## 2016-10-28 NOTE — Evaluation (Signed)
Physical Therapy Evaluation Patient Details Name: Maurice Golden MRN: [id number] DOB: 14-May-1961 Today's Date: 10/28/2016   History of Present Illness  Pt is a 56 y.o. male now s/p Rt TKA. PMH: atrial fibrillation, obesity, HTN, bradycardia.  Clinical Impression  Pt is s/p TKA resulting in the deficits listed below (see PT Problem List). Pt with limited ambulation tolerance during initial session, requesting seated break after 6 ft with rw. Pt is planning to D/C to home, likely 10/29/16 with friends and family to assist. Pt will benefit from skilled PT to increase their independence and safety with mobility to allow discharge to the venue listed below.      Follow Up Recommendations Home health PT;Supervision for mobility/OOB    Equipment Recommendations  Rolling walker with 5" wheels    Recommendations for Other Services       Precautions / Restrictions Precautions Precautions: Knee Precaution Booklet Issued: Yes (comment) Precaution Comments: HEP provided, reviewed knee extension precautions Required Braces or Orthoses: Knee Immobilizer - Right Knee Immobilizer - Right: On when out of bed or walking Restrictions Weight Bearing Restrictions: Yes RLE Weight Bearing: Weight bearing as tolerated      Mobility  Bed Mobility Overal bed mobility: Needs Assistance Bed Mobility: Supine to Sit     Supine to sit: Mod assist     General bed mobility comments: assist needed with Rt LE  Transfers Overall transfer level: Needs assistance Equipment used: Rolling walker (2 wheeled) Transfers: Sit to/from Stand Sit to Stand: Min assist;From elevated surface         General transfer comment: pt c/o rt quad pain when coming to EOB  Ambulation/Gait Ambulation/Gait assistance: Min guard Ambulation Distance (Feet): 6 Feet Assistive device: Rolling walker (2 wheeled) Gait Pattern/deviations: Step-to pattern;Decreased weight shift to right Gait velocity: slow pattern   General Gait  Details: cues for gait sequence  Stairs            Wheelchair Mobility    Modified Rankin (Stroke Patients Only)       Balance Overall balance assessment: Needs assistance Sitting-balance support: No upper extremity supported Sitting balance-Leahy Scale: Good     Standing balance support: During functional activity Standing balance-Leahy Scale: Fair Standing balance comment: able to stand static without UE support                             Pertinent Vitals/Pain Pain Assessment: 0-10 Pain Score: 6  Pain Location: Rt quad Pain Descriptors / Indicators: Aching;Guarding;Cramping Pain Intervention(s): Limited activity within patient's tolerance;Monitored during session    Home Living Family/patient expects to be discharged to:: Private residence Living Arrangements: Alone Available Help at Discharge: Family;Friend(s);Available 24 hours/day Type of Home: Apartment Home Access: Level entry     Home Layout: One level Home Equipment: None Additional Comments: Pt states that he has family and friends to help when he returns home, 24/7.      Prior Function Level of Independence: Independent               Hand Dominance        Extremity/Trunk Assessment   Upper Extremity Assessment Upper Extremity Assessment: Overall WFL for tasks assessed    Lower Extremity Assessment Lower Extremity Assessment: RLE deficits/detail RLE Deficits / Details: unable to perform SLR       Communication   Communication: No difficulties  Cognition Arousal/Alertness: Awake/alert Behavior During Therapy: WFL for tasks assessed/performed Overall Cognitive Status: Within Functional Limits  for tasks assessed                      General Comments      Exercises Total Joint Exercises Ankle Circles/Pumps: AROM;Both;10 reps Quad Sets: Strengthening;Right;10 reps   Assessment/Plan    PT Assessment Patient needs continued PT services  PT Problem List  Decreased strength;Decreased range of motion;Decreased activity tolerance;Decreased balance;Decreased mobility;Decreased safety awareness;Pain          PT Treatment Interventions DME instruction;Gait training;Stair training;Functional mobility training;Therapeutic activities;Therapeutic exercise;Patient/family education    PT Goals (Current goals can be found in the Care Plan section)  Acute Rehab PT Goals 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, coach basketball PT Goal Formulation: With patient Time For Goal Achievement: 11/11/16 Potential to Achieve Goals: Good    Frequency 7X/week   Barriers to discharge        Co-evaluation               End of Session Equipment Utilized During Treatment: Right knee immobilizer (pt refused gait belt) Activity Tolerance: Patient limited by fatigue;Patient limited by pain (reports feeling mildly dizzy) Patient left: in chair;with call bell/phone within reach;with nursing/sitter in room (in knee extension) Nurse Communication: Mobility status;Precautions;Weight bearing status         Time: 4098-1191 PT Time Calculation (min) (ACUTE ONLY): 36 min   Charges:   PT Evaluation $PT Eval Moderate Complexity: 1 Procedure PT Treatments $Gait Training: 8-22 mins   PT G Codes:        Christiane Ha, PT, CSCS Pager [phone] Office [phone]  10/28/2016, 10:55 AM

## 2016-10-28 NOTE — Telephone Encounter (Signed)
Paperwork for patient assistance for Xarelto mailed to patient.

## 2016-10-28 NOTE — Progress Notes (Signed)
Physical Therapy Treatment Patient Details Name: Maurice Golden MRN: [id number] DOB: [birth year] Today's Date: 10/28/2016    History of Present Illness Pt is a 56 y.o. male now s/p Rt TKA. PMH: atrial fibrillation, obesity, HTN, bradycardia.    PT Comments    Pt making progress with mobility during second PT session, able to ambulate 90 ft with rw, using knee immobilizer. Pt continues to c/o Rt quad musculature as primary area of pain. Anticipate pt will D/C to home with family and friends to assist. PT to follow to progress mobility and maximize safety.   Follow Up Recommendations  Home health PT;Supervision for mobility/OOB     Equipment Recommendations  Rolling walker with 5" wheels    Recommendations for Other Services       Precautions / Restrictions Precautions Precautions: Knee Required Braces or Orthoses: Knee Immobilizer - Right Knee Immobilizer - Right: On when out of bed or walking Restrictions Weight Bearing Restrictions: Yes RLE Weight Bearing: Weight bearing as tolerated    Mobility  Bed Mobility Overal bed mobility: Needs Assistance Bed Mobility: Sit to Supine       Sit to supine: Supervision   General bed mobility comments: pt able to raise Rt LE into bed without assist, KI on  Transfers Overall transfer level: Needs assistance Equipment used: Rolling walker (2 wheeled) Transfers: Sit to/from Stand Sit to Stand: Min guard         General transfer comment: pt using UEs to assist, c/o rt quad pain as primary area of pain.   Ambulation/Gait Ambulation/Gait assistance: Min guard Ambulation Distance (Feet): 90 Feet Assistive device: Rolling walker (2 wheeled) Gait Pattern/deviations: Step-to pattern;Step-through pattern;Decreased stance time - right;Decreased weight shift to right Gait velocity: decreased   General Gait Details: progressing with cues to step through pattern, working on weightbearing on Rt with stance phase.    Stairs             Wheelchair Mobility    Modified Rankin (Stroke Patients Only)       Balance Overall balance assessment: Needs assistance Sitting-balance support: No upper extremity supported Sitting balance-Leahy Scale: Good     Standing balance support: During functional activity Standing balance-Leahy Scale: Fair Standing balance comment: able to stand static without UE support                    Cognition Arousal/Alertness: Awake/alert Behavior During Therapy: WFL for tasks assessed/performed Overall Cognitive Status: Within Functional Limits for tasks assessed                      Exercises Total Joint Exercises Ankle Circles/Pumps: AROM;Both;10 reps Quad Sets: Strengthening;Right;10 reps Heel Slides: AAROM;Right;10 reps Hip ABduction/ADduction: Strengthening;Right;10 reps Straight Leg Raises: Strengthening;5 reps Goniometric ROM: 80 degrees knee flexion    General Comments        Pertinent Vitals/Pain Pain Assessment: 0-10 Pain Score: 6  Pain Location: Rt quad Pain Descriptors / Indicators: Aching Pain Intervention(s): Limited activity within patient's tolerance;Monitored during session    Home Living                      Prior Function            PT Goals (current goals can now be found in the care plan section) Acute Rehab PT Goals 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, coach basketball PT Goal Formulation: With patient Time For Goal Achievement: 11/11/16 Potential to Achieve Goals: Good Progress towards PT  goals: Progressing toward goals    Frequency    7X/week      PT Plan Current plan remains appropriate    Co-evaluation             End of Session Equipment Utilized During Treatment: Right knee immobilizer Activity Tolerance: Patient tolerated treatment well Patient left: in bed;in CPM;with call bell/phone within reach;with family/visitor present     Time: 1359-1444 PT Time Calculation (min) (ACUTE ONLY): 45  min  Charges:  $Gait Training: 23-37 mins $Therapeutic Exercise: 8-22 mins                    G Codes:      Christiane Ha, PT, CSCS Pager [phone] Office [phone]]  10/28/2016, 4:00 PM

## 2016-10-28 NOTE — Progress Notes (Signed)
Subjective: 1 Day Post-Op Procedure(s) (LRB): TOTAL KNEE ARTHROPLASTY (Right) Patient reports pain as moderate.  Denies chest pain or shortness of breath. Taking by mouth and voiding okay. Not out of bed yet. Objective: Vital signs in last 24 hours: Temp:  [97.4 F (36.3 C)-98.9 F (37.2 C)] 98 F (36.7 C) (01/15 2221) Pulse Rate:  [45-65] 61 (01/15 2221) Resp:  [8-20] 15 (01/15 2221) BP: (148-202)/(73-117) 148/83 (01/15 2221) SpO2:  [96 %-100 %] 100 % (01/15 2221) Weight:  [138.8 kg (306 lb)] 138.8 kg (306 lb) (01/15 1236)  Intake/Output from previous day: 01/15 0701 - 01/16 0700 In: 260 [P.O.:60; I.V.:200] Out: 400 [Urine:300; Blood:100] Intake/Output this shift: No intake/output data recorded.   Recent Labs  10/28/16 0316  HGB 11.1*    Recent Labs  10/28/16 0316  WBC 9.3  RBC 4.05*  HCT 34.1*  PLT 256    Recent Labs  10/28/16 0316  NA 135  K 3.9  CL 101  CO2 26  BUN 20  CREATININE 1.04  GLUCOSE 203*  CALCIUM 8.8*    Recent Labs  10/27/16 1218  INR 1.07  Right knee exam:  Neurovascular intact Sensation intact distally Intact pulses distally Dorsiflexion/Plantar flexion intact Incision: dressing C/D/I Compartment soft  Assessment/Plan: 1 Day Post-Op Procedure(s) (LRB): TOTAL KNEE ARTHROPLASTY (Right) Plan: Continue Xarelto 20 mg daily which he was on preoperatively. Weight-bear as tolerated on right. Encouraged range of motion of right knee including use of CPM machine. Up with therapy Plan for discharge tomorrow  Maurice Golden 10/28/2016, 9:32 AM

## 2016-10-29 ENCOUNTER — Encounter (HOSPITAL_COMMUNITY): Payer: Self-pay | Admitting: General Practice

## 2016-10-29 LAB — CBC
HCT: 30.7 % — ABNORMAL LOW (ref 39.0–52.0)
Hemoglobin: 10.2 g/dL — ABNORMAL LOW (ref 13.0–17.0)
MCH: 27.9 pg (ref 26.0–34.0)
MCHC: 33.2 g/dL (ref 30.0–36.0)
MCV: 84.1 fL (ref 78.0–100.0)
Platelets: 252 10*3/uL (ref 150–400)
RBC: 3.65 MIL/uL — ABNORMAL LOW (ref 4.22–5.81)
RDW: 13.8 % (ref 11.5–15.5)
WBC: 14.5 10*3/uL — ABNORMAL HIGH (ref 4.0–10.5)

## 2016-10-29 NOTE — Progress Notes (Signed)
Physical Therapy Treatment Patient Details Name: Maurice Golden MRN: [id number] DOB: 05/08/1961 Today's Date: 10/29/2016    History of Present Illness Pt is a 56 y.o. male now s/p Rt TKA. PMH: atrial fibrillation, obesity, HTN, bradycardia.    PT Comments    Patient is making gradual progress toward mobility goals. Needs to work on progressing gait next session. Current plan remains appropriate.   Follow Up Recommendations  Home health PT;Supervision for mobility/OOB     Equipment Recommendations  Rolling walker with 5" wheels    Recommendations for Other Services       Precautions / Restrictions Precautions Precautions: Knee Required Braces or Orthoses: Knee Immobilizer - Right Knee Immobilizer - Right: On when out of bed or walking Restrictions Weight Bearing Restrictions: Yes RLE Weight Bearing: Weight bearing as tolerated    Mobility  Bed Mobility Overal bed mobility: Needs Assistance Bed Mobility: Sit to Supine;Supine to Sit     Supine to sit: Min assist Sit to supine: Min assist   General bed mobility comments: assist to mobilize R LE; cues for technique  Transfers Overall transfer level: Needs assistance Equipment used: Rolling walker (2 wheeled) Transfers: Sit to/from Stand Sit to Stand: Supervision         General transfer comment: supervision for safety; cues for hand placement and technique  Ambulation/Gait Ambulation/Gait assistance: Supervision Ambulation Distance (Feet): 100 Feet Assistive device: Rolling walker (2 wheeled) Gait Pattern/deviations: Step-through pattern;Decreased stance time - right;Decreased weight shift to right;Decreased step length - left Gait velocity: decreased   General Gait Details: cues for sequencing, proximity of RW, posture, and R heel strike; pt with improved step through pattern with increased distance   Stairs            Wheelchair Mobility    Modified Rankin (Stroke Patients Only)       Balance  Overall balance assessment: Needs assistance Sitting-balance support: No upper extremity supported Sitting balance-Leahy Scale: Good     Standing balance support: During functional activity Standing balance-Leahy Scale: Fair Standing balance comment: able to stand static without UE support                    Cognition Arousal/Alertness: Awake/alert Behavior During Therapy: WFL for tasks assessed/performed Overall Cognitive Status: Within Functional Limits for tasks assessed                      Exercises Total Joint Exercises Quad Sets: AROM;Right;10 reps Long Arc Quad: AAROM;Right;10 reps Knee Flexion: AROM;Right;5 reps;Seated;Other (comment) (10 sec holds)    General Comments        Pertinent Vitals/Pain Pain Assessment: Faces Faces Pain Scale: Hurts even more Pain Location: Rt quad Pain Descriptors / Indicators: Aching;Grimacing;Guarding;Sore Pain Intervention(s): Limited activity within patient's tolerance;Monitored during session;Premedicated before session;Repositioned    Home Living Family/patient expects to be discharged to:: Private residence Living Arrangements: Alone                  Prior Function            PT Goals (current goals can now be found in the care plan section) Acute Rehab PT Goals Patient Stated Goal: be more active again Progress towards PT goals: Progressing toward goals    Frequency    7X/week      PT Plan Current plan remains appropriate    Co-evaluation             End of Session Equipment Utilized During Treatment:  Gait belt Activity Tolerance: Patient tolerated treatment well Patient left: in bed;with call bell/phone within reach     Time: 1530-1600 PT Time Calculation (min) (ACUTE ONLY): 30 min  Charges:  $Gait Training: 8-22 mins $Therapeutic Exercise: 8-22 mins                    G Codes:      Maurice Golden, PTA Pager: [phone]   10/29/2016, 4:07 PM

## 2016-10-29 NOTE — Discharge Summary (Signed)
Patient ID: Maurice Golden MRN: [id number] DOB/AGE: Dec 27, 1960 56 y.o.  Admit date: 10/27/2016 Discharge date: 10/31/2015 Admission Diagnoses:  Principal Problem:   Primary osteoarthritis of right knee Active Problems:   Postoperative anemia due to acute blood loss   Discharge Diagnoses:  Same  Past Medical History:  Diagnosis Date  . ANEMIA    PATIENT DENIES  . Arthritis   . BRADYCARDIA   . Bradycardia   . Dysrhythmia   . Hypertension   . Obesity   . OSA (obstructive sleep apnea)    cpap   . Paroxysmal atrial fibrillation (HCC)     Surgeries: Procedure(s):Right TOTAL KNEE ARTHROPLASTY on 10/27/2016   Discharged Condition: Improved  Hospital Course: Maurice Golden is an 56 y.o. male who was admitted 10/27/2016 for operative treatment ofPrimary osteoarthritis of right knee. Patient has severe unremitting pain that affects sleep, daily activities, and work/hobbies. After pre-op clearance the patient was taken to the operating room on 10/27/2016 and underwent  Procedure(s): Right TOTAL KNEE ARTHROPLASTY.    Patient was given perioperative antibiotics:  Anti-infectives    Start     Dose/Rate Route Frequency Ordered Stop   10/27/16 2000  ceFAZolin (ANCEF) IVPB 2g/100 mL premix     2 g 200 mL/hr over 30 Minutes Intravenous Every 6 hours 10/27/16 1849 10/28/16 0213   10/27/16 1245  ceFAZolin (ANCEF) 3 g in dextrose 5 % 50 mL IVPB     3 g 130 mL/hr over 30 Minutes Intravenous To [facility]Stay Surgical 10/24/16 0741 10/27/16 1442       Patient was given sequential compression devices, early ambulation, and chemoprophylaxis to prevent DVT.Patient was resumed on Xarelto which he was taking preoperatively the night of his surgery. He progressed well with physical therapy.  Patient benefited maximally from hospital stay and there were no complications.    Recent vital signs:  Patient Vitals for the past 24 hrs:  BP Temp Temp src Pulse Resp SpO2  10/30/16 0453 139/72 98.5 F (36.9 C) Oral  (!) 56 18 99 %  10/29/16 2205 - - - 67 18 98 %  10/29/16 2203 (!) 159/81 98.2 F (36.8 C) - (!) 58 18 98 %  10/29/16 1518 (!) 146/69 98.1 F (36.7 C) - 69 18 97 %     Recent laboratory studies:  Recent Labs  10/27/16 1218  10/28/16 0316 10/29/16 0359 10/30/16 0351  WBC  --   < > 9.3 14.5* 14.7*  HGB  --   < > 11.1* 10.2* 9.5*  HCT  --   < > 34.1* 30.7* 28.9*  PLT  --   < > 256 252 244  NA  --   --  135  --   --   K  --   --  3.9  --   --   CL  --   --  101  --   --   CO2  --   --  26  --   --   BUN  --   --  20  --   --   CREATININE  --   --  1.04  --   --   GLUCOSE  --   --  203*  --   --   INR 1.07  --   --   --   --   CALCIUM  --   --  8.8*  --   --   < > = values in this interval not displayed.   Discharge  Medications:   Allergies as of 10/30/2016      Reactions   Hydrocodone Itching   Severe itching   Other Itching   All narcotics - Severe itching       Medication List    TAKE these medications   amiodarone 200 MG tablet Commonly known as:  PACERONE Take 0.5 tablets (100 mg total) by mouth daily.   amLODipine 5 MG tablet Commonly known as:  NORVASC Take 1 tablet (5 mg total) by mouth daily.   docusate sodium 100 MG capsule Commonly known as:  COLACE Take 1 capsule (100 mg total) by mouth 2 (two) times daily.   docusate sodium 100 MG capsule Commonly known as:  COLACE Take 1 capsule (100 mg total) by mouth 2 (two) times daily.   metoprolol succinate 50 MG 24 hr tablet Commonly known as:  TOPROL-XL Take 2 tablets (100 mg total) by mouth daily. Take with or immediately following a meal.   multivitamin Tabs tablet Take 1 tablet by mouth daily.   oxyCODONE-acetaminophen 5-325 MG tablet Commonly known as:  PERCOCET/ROXICET Take 1-2 tablets by mouth every 4 (four) hours as needed for severe pain.   rivaroxaban 20 MG Tabs tablet Commonly known as:  XARELTO Take 1 tablet (20 mg total) by mouth daily with supper.   tiZANidine 2 MG tablet Commonly  known as:  ZANAFLEX Take 1 tablet (2 mg total) by mouth every 8 (eight) hours as needed for muscle spasms.       Diagnostic Studies: Dg Chest 2 View  Result Date: 10/20/2016 CLINICAL DATA:  Preoperative chest x-ray prior to knee arthroplasty next week. History of atrial fibrillation, hypertension. EXAM: CHEST  2 VIEW COMPARISON:  PA and lateral chest x-ray of September 27, 2013 FINDINGS: The lungs are adequately inflated. There is no focal infiltrate. There is no pleural effusion. The heart and pulmonary vascularity are normal. The mediastinum is normal in width. There is tortuosity of the descending thoracic aorta. The trachea is midline. The bony thorax exhibits no acute abnormality. IMPRESSION: There is no acute cardiopulmonary abnormality. Electronically Signed   By: David  [place] M.D.   On: 10/20/2016 15:45    Disposition: 01-Home or Self Care  Discharge Instructions    CPM    Complete by:  As directed    Continuous passive motion machine (CPM):      Use the CPM from 0 to 70 for 8 hours per day.      You may increase by 5-10 per day.  You may break it up into 2 or 3 sessions per day.      Use CPM for 1-2 weeks or until you are told to stop.   Call MD / Call 911    Complete by:  As directed    If you experience chest pain or shortness of breath, CALL 911 and be transported to the hospital emergency room.  If you develope a fever above 101 F, pus (white drainage) or increased drainage or redness at the wound, or calf pain, call your surgeon's office.   Constipation Prevention    Complete by:  As directed    Drink plenty of fluids.  Prune juice may be helpful.  You may use a stool softener, such as Colace (over the counter) 100 mg twice a day.  Use MiraLax (over the counter) for constipation as needed.   Diet general    Complete by:  As directed    Do not put a pillow under the knee.  Place it under the heel.    Complete by:  As directed    Increase activity slowly as tolerated     Complete by:  As directed    Weight bearing as tolerated    Complete by:  As directed    Laterality:  right   Extremity:  Lower   Weight bearing as tolerated    Complete by:  As directed    Laterality:  right   Extremity:  Lower   Weight bearing as tolerated    Complete by:  As directed    Laterality:  right   Extremity:  Lower      Follow-up Information    GRAVES,Charlotte L, MD. Schedule an appointment as soon as possible for a visit in 2 week(s).   Specialty:  Orthopedic Surgery Contact information: [address] [place] [address] [phone]        [facility] Follow up.   Specialty:  Home Health Services Why:  Someone from [facility] will contact you to arrange start date and time for therapy. Contact information: [employer] [address] [place] [address] [phone]            Signed: Matthew Folks 10/30/2016, 6:58 AM

## 2016-10-29 NOTE — Progress Notes (Signed)
Physical Therapy Treatment Patient Details Name: Maurice Golden MRN: [id number] DOB: [birth year] Today's Date: 10/29/2016    History of Present Illness Pt is a 56 y.o. male now s/p Rt TKA. PMH: atrial fibrillation, obesity, HTN, bradycardia.    PT Comments    Patient is progressing toward mobility goals. Continues to c/o pain mainly in R quad. Continue to progress as tolerated with anticipated d/c home with HHPT.   Follow Up Recommendations  Home health PT;Supervision for mobility/OOB     Equipment Recommendations  Rolling walker with 5" wheels    Recommendations for Other Services       Precautions / Restrictions Precautions Precautions: Knee Required Braces or Orthoses: Knee Immobilizer - Right Knee Immobilizer - Right: On when out of bed or walking Restrictions Weight Bearing Restrictions: Yes RLE Weight Bearing: Weight bearing as tolerated    Mobility  Bed Mobility Overal bed mobility: Needs Assistance Bed Mobility: Sit to Supine;Supine to Sit     Supine to sit: Min assist Sit to supine: Min assist   General bed mobility comments: assist to mobilize R LE; cues for technique  Transfers Overall transfer level: Needs assistance Equipment used: Rolling walker (2 wheeled) Transfers: Sit to/from Stand Sit to Stand: Supervision         General transfer comment: supervision for safety; cues for hand placement  Ambulation/Gait Ambulation/Gait assistance: Supervision Ambulation Distance (Feet): 100 Feet Assistive device: Rolling walker (2 wheeled) Gait Pattern/deviations: Step-through pattern;Decreased stance time - right;Decreased weight shift to right;Decreased step length - left Gait velocity: decreased   General Gait Details: close supervision for safety; cues for posture and safe use of AD; pt with improved step through pattern   Stairs            Wheelchair Mobility    Modified Rankin (Stroke Patients Only)       Balance Overall balance  assessment: Needs assistance Sitting-balance support: No upper extremity supported Sitting balance-Leahy Scale: Good     Standing balance support: During functional activity Standing balance-Leahy Scale: Fair Standing balance comment: able to stand static without UE support                    Cognition Arousal/Alertness: Awake/alert Behavior During Therapy: WFL for tasks assessed/performed Overall Cognitive Status: Within Functional Limits for tasks assessed                      Exercises Total Joint Exercises Quad Sets: Strengthening;Right;10 reps Heel Slides: AAROM;Right;10 reps Hip ABduction/ADduction: Strengthening;Right;10 reps Straight Leg Raises: Strengthening;10 reps    General Comments        Pertinent Vitals/Pain Pain Assessment: Faces Faces Pain Scale: Hurts even more Pain Location: Rt quad Pain Descriptors / Indicators: Aching;Grimacing;Guarding;Sore    Home Living                      Prior Function            PT Goals (current goals can now be found in the care plan section) Acute Rehab PT Goals Patient Stated Goal: be more active again Progress towards PT goals: Progressing toward goals    Frequency    7X/week      PT Plan Current plan remains appropriate    Co-evaluation             End of Session Equipment Utilized During Treatment: Right knee immobilizer;Gait belt Activity Tolerance: Patient tolerated treatment well Patient left: in bed;with call bell/phone within  reach     Time: 0908-0956 PT Time Calculation (min) (ACUTE ONLY): 48 min  Charges:  $Gait Training: 8-22 mins $Therapeutic Exercise: 8-22 mins $Therapeutic Activity: 8-22 mins                    G Codes:      Derek Mound, PTA Pager: [phone]   10/29/2016, 11:22 AM

## 2016-10-29 NOTE — Care Management Note (Signed)
Case Management Note  Patient Details  Name: Phinehas Leesman MRN: [id number] Date of Birth: [birth year]  Subjective/Objective:   56 yr old gentleman s/p right total knee arthroplasty.             Action/Plan: Case manager spoke with patient concerning Home health and DME needs. Choice was offered for Home Health agency, referral was called to Katharina Caper, [facility] Liaison. RW and 3in1 have been delivered to patient's room.    Expected Discharge Date:  10/29/16               Expected Discharge Plan:  Home w Home Health Services  In-House Referral:  NA  Discharge planning Services  CM Consult  Post Acute Care Choice:  Durable Medical Equipment, Home Health Choice offered to:  Patient  DME Arranged:  3-N-1, Walker rolling DME Agency:  [facility]  HH Arranged:  PT [facility] Agency:  [facility]  Status of Service:  Completed, signed off  If discussed at Long Length of Stay Meetings, dates discussed:    Additional Comments:  Durenda Guthrie, RN 10/29/2016, 2:02 PM

## 2016-10-29 NOTE — Progress Notes (Signed)
Subjective: 2 Days Post-Op Procedure(s) (LRB): TOTAL KNEE ARTHROPLASTY (Right) Patient reports pain as moderate.  Denies chest pain or shortness of breath. Eating and voiding without difficulty. Ambulated in hall with physical therapy yesterday.  Objective: Vital signs in last 24 hours: Temp:  [98.5 F (36.9 C)-99.2 F (37.3 C)] 98.5 F (36.9 C) (01/17 0500) Pulse Rate:  [69-77] 69 (01/17 0500) Resp:  [16] 16 (01/17 0500) BP: (157-166)/(64-76) 157/64 (01/17 0500) SpO2:  [97 %-98 %] 98 % (01/17 0500)  Intake/Output from previous day: 01/16 0701 - 01/17 0700 In: 240 [P.O.:240] Out: -  Intake/Output this shift: No intake/output data recorded.   Recent Labs  10/28/16 0316 10/29/16 0359  HGB 11.1* 10.2*    Recent Labs  10/28/16 0316 10/29/16 0359  WBC 9.3 14.5*  RBC 4.05* 3.65*  HCT 34.1* 30.7*  PLT 256 252    Recent Labs  10/28/16 0316  NA 135  K 3.9  CL 101  CO2 26  BUN 20  CREATININE 1.04  GLUCOSE 203*  CALCIUM 8.8*    Recent Labs  10/27/16 1218  INR 1.07  Right knee exam: Moderate knee swelling. Calf soft and nontender.  Neurovascular intact Sensation intact distally Intact pulses distally Dorsiflexion/Plantar flexion intact Incision: dressing C/D/I Compartment soft  Assessment/Plan: 2 Days Post-Op Procedure(s) (LRB): TOTAL KNEE ARTHROPLASTY (Right) Plan: Weight-bear as tolerated on right. Wear knee immobilizer when up. Encouraged aggressive use of CPM. Up with therapy Discharge home with home health  Follow-up with Dr. Luiz Blare in 2 weeks.  Vedansh Kerstetter G 10/29/2016, 7:45 AM

## 2016-10-29 NOTE — Op Note (Signed)
NAME:  Maurice Golden, Maurice Golden NO.:  [id number]  MEDICAL RECORD NO.:  [id number]  LOCATION:                                 FACILITY:  PHYSICIAN:  Harvie Junior, M.D.   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  PROCEDURE:  Right total knee replacement with an Attune system size 8 femur, size 9 tibia, a 12 mm bridging bearing, and a 41 mm all polyethylene patella.  SURGEON:  Harvie Junior, M.D.  ASSISTANT:  Marshia Ly, PA  ANESTHESIA:  General.  BRIEF HISTORY:  Mr. Wafer is a 56 year old male with a long history of severe complaints of bilateral knee pain.  He had x-ray showing severe bone-on-bone change with severe medial narrowing.  We treated him conservatively for a long period of time with steroid injection and activity modification and viscous supplementation.  After failure of all these things, he was taken to the operating room for right total knee replacement.  DESCRIPTION OF PROCEDURE:  The patient was taken to the operating room. After adequate anesthesia was obtained with general anesthetic, the patient was placed supine on the operating room table.  The right leg was prepped and draped in usual sterile fashion.  Following this, the leg was exsanguinated.  Blood pressure tourniquet was inflated to 350 mmHg.  Following this, a midline incision was made in the subcutaneous tissues down to the level of extensor mechanism and medial parapatellar arthrotomy was undertaken.  A large medial release was done because of severe varus malalignment and attention was turned towards removal of the anterior and posterior cruciates,  retropatellar fat pad, synovium on the anterior aspect of the femur, and medial and lateral meniscus.  Once this was removed, attention was turned to the femur where an intramedullary pilot hole was drilled and a 4-degree valgus inclination was chosen when we because of his flexion contracture took 10 mm of distal bone.  Once this was done, attention was turned towards sizing the femur, sized to an 8, anterior and posterior cuts were made, chamfers and box.  Attention was then turned to the tibia.  It was cut perpendicular to its long axis.  Care being taken to get to the bottom of the defect on the medial side, which was quite low and once this was done, it was sized to a 9.  After osteophytes were removed, it was drilled and keeled, and trials were put in place, 12 seemed to be the best fit for polyethylene.  Attention turned to the patella, cut down to a level of 9.5 mm of bone were taken and the patella was put in place, the 41.  At this point, the trials were all put in place.  Range of motion was performed and excellent stability was achieved.  At this point, the attention was turned towards removing all trial components. The knee was then copiously and thoroughly  irrigated with pulsatile lavage irrigation and suctioned dry.  The final components were then cemented in place, size 9 tibia, size 8 femur, 12 mm bridging bearing trial was placed, and a 41 all poly patella was placed and held with a clamp.  Once this was done, all excess bone cement were removed and then the medial parapatellar arthrotomy was closed with 1 Vicryl running, skin with 0 and 2-0 Vicryl, and 3-0 Monocryl subcuticular.  Benzoin and Steri-Strips were applied.  Sterile compressive dressing was applied. The patient was taken to the recovery room and was noted to be in a satisfactory condition.  Estimated blood loss for procedure is minimal.     Harvie Junior, M.D.   ______________________________ Harvie Junior, M.D.    Ranae Plumber  D:  10/27/2016  T:  10/28/2016  Job:  [id number]

## 2016-10-29 NOTE — Discharge Instructions (Signed)
INSTRUCTIONS AFTER JOINT REPLACEMENT  ° °o Remove items at home which could result in a fall. This includes throw rugs or furniture in walking pathways °o ICE to the affected joint every three hours while awake for 30 minutes at a time, for at least the first 3-5 days, and then as needed for pain and swelling.  Continue to use ice for pain and swelling. You may notice swelling that will progress down to the foot and ankle.  This is normal after surgery.  Elevate your leg when you are not up walking on it.   °o Continue to use the breathing machine you got in the hospital (incentive spirometer) which will help keep your temperature down.  It is common for your temperature to cycle up and down following surgery, especially at night when you are not up moving around and exerting yourself.  The breathing machine keeps your lungs expanded and your temperature down. ° ° °DIET:  As you were doing prior to hospitalization, we recommend a well-balanced diet. ° °DRESSING / WOUND CARE / SHOWERING ° °Keep the surgical dressing until follow up.  The dressing is water proof, so you can shower without any extra covering.  IF THE DRESSING FALLS OFF or the wound gets wet inside, change the dressing with sterile gauze.  Please use good hand washing techniques before changing the dressing.  Do not use any lotions or creams on the incision until instructed by your surgeon.   ° °ACTIVITY ° °o Increase activity slowly as tolerated, but follow the weight bearing instructions below.   °o No driving for 6 weeks or until further direction given by your physician.  You cannot drive while taking narcotics.  °o No lifting or carrying greater than 10 lbs. until further directed by your surgeon. °o Avoid periods of inactivity such as sitting longer than an hour when not asleep. This helps prevent blood clots.  °o You may return to work once you are authorized by your doctor.  ° ° ° °WEIGHT BEARING  ° °Weight bearing as tolerated with assist  device (walker, cane, etc) as directed, use it as long as suggested by your surgeon or therapist, typically at least 4-6 weeks. ° ° °EXERCISES ° °Results after joint replacement surgery are often greatly improved when you follow the exercise, range of motion and muscle strengthening exercises prescribed by your doctor. Safety measures are also important to protect the joint from further injury. Any time any of these exercises cause you to have increased pain or swelling, decrease what you are doing until you are comfortable again and then slowly increase them. If you have problems or questions, call your caregiver or physical therapist for advice.  ° °Rehabilitation is important following a joint replacement. After just a few days of immobilization, the muscles of the leg can become weakened and shrink (atrophy).  These exercises are designed to build up the tone and strength of the thigh and leg muscles and to improve motion. Often times heat used for twenty to thirty minutes before working out will loosen up your tissues and help with improving the range of motion but do not use heat for the first two weeks following surgery (sometimes heat can increase post-operative swelling).  ° °These exercises can be done on a training (exercise) mat, on the floor, on a table or on a bed. Use whatever works the best and is most comfortable for you.    Use music or television while you are exercising so that   the exercises are a pleasant break in your day. This will make your life better with the exercises acting as a break in your routine that you can look forward to.   Perform all exercises about fifteen times, three times per day or as directed.  You should exercise both the operative leg and the other leg as well. ° °Exercises include: °  °• Quad Sets - Tighten up the muscle on the front of the thigh (Quad) and hold for 5-10 seconds.   °• Straight Leg Raises - With your knee straight (if you were given a brace, keep it on),  lift the leg to 60 degrees, hold for 3 seconds, and slowly lower the leg.  Perform this exercise against resistance later as your leg gets stronger.  °• Leg Slides: Lying on your back, slowly slide your foot toward your buttocks, bending your knee up off the floor (only go as far as is comfortable). Then slowly slide your foot back down until your leg is flat on the floor again.  °• Angel Wings: Lying on your back spread your legs to the side as far apart as you can without causing discomfort.  °• Hamstring Strength:  Lying on your back, push your heel against the floor with your leg straight by tightening up the muscles of your buttocks.  Repeat, but this time bend your knee to a comfortable angle, and push your heel against the floor.  You may put a pillow under the heel to make it more comfortable if necessary.  ° °A rehabilitation program following joint replacement surgery can speed recovery and prevent re-injury in the future due to weakened muscles. Contact your doctor or a physical therapist for more information on knee rehabilitation.  ° ° °CONSTIPATION ° °Constipation is defined medically as fewer than three stools per week and severe constipation as less than one stool per week.  Even if you have a regular bowel pattern at home, your normal regimen is likely to be disrupted due to multiple reasons following surgery.  Combination of anesthesia, postoperative narcotics, change in appetite and fluid intake all can affect your bowels.  ° °YOU MUST use at least one of the following options; they are listed in order of increasing strength to get the job done.  They are all available over the counter, and you may need to use some, POSSIBLY even all of these options:   ° °Drink plenty of fluids (prune juice may be helpful) and high fiber foods °Colace 100 mg by mouth twice a day  °Senokot for constipation as directed and as needed Dulcolax (bisacodyl), take with full glass of water  °Miralax (polyethylene glycol)  once or twice a day as needed. ° °If you have tried all these things and are unable to have a bowel movement in the first 3-4 days after surgery call either your surgeon or your primary doctor.   ° °If you experience loose stools or diarrhea, hold the medications until you stool forms back up.  If your symptoms do not get better within 1 week or if they get worse, check with your doctor.  If you experience "the worst abdominal pain ever" or develop nausea or vomiting, please contact the office immediately for further recommendations for treatment. ° ° °ITCHING:  If you experience itching with your medications, try taking only a single pain pill, or even half a pain pill at a time.  You can also use Benadryl over the counter for itching or also to   help with sleep.  ° °TED HOSE STOCKINGS:  Use stockings on both legs until for at least 2 weeks or as directed by physician office. They may be removed at night for sleeping. ° °MEDICATIONS:  See your medication summary on the “After Visit Summary” that nursing will review with you.  You may have some home medications which will be placed on hold until you complete the course of blood thinner medication.  It is important for you to complete the blood thinner medication as prescribed. ° °PRECAUTIONS:  If you experience chest pain or shortness of breath - call 911 immediately for transfer to the hospital emergency department.  ° °If you develop a fever greater that 101 F, purulent drainage from wound, increased redness or drainage from wound, foul odor from the wound/dressing, or calf pain - CONTACT YOUR SURGEON.   °                                                °FOLLOW-UP APPOINTMENTS:  If you do not already have a post-op appointment, please call the office for an appointment to be seen by your surgeon.  Guidelines for how soon to be seen are listed in your “After Visit Summary”, but are typically between 1-4 weeks after surgery. ° °OTHER INSTRUCTIONS:  ° °Knee  Replacement:  Do not place pillow under knee, focus on keeping the knee straight while resting. CPM instructions: 0-90 degrees, 2 hours in the morning, 2 hours in the afternoon, and 2 hours in the evening. Place foam block, curve side up under heel at all times except when in CPM or when walking.  DO NOT modify, tear, cut, or change the foam block in any way. ° °MAKE SURE YOU:  °• Understand these instructions.  °• Get help right away if you are not doing well or get worse.  ° ° °Thank you for letting us be a part of your medical care team.  It is a privilege we respect greatly.  We hope these instructions will help you stay on track for a fast and full recovery!  ° ° ° °Information on my medicine - XARELTO® (Rivaroxaban) ° °Why was Xarelto® prescribed for you? °Xarelto® was prescribed for you to reduce the risk of a blood clot forming that can cause a stroke if you have a medical condition called atrial fibrillation (a type of irregular heartbeat). ° °What do you need to know about xarelto® ? °Take your Xarelto® ONCE DAILY at the same time every day with your evening meal. °If you have difficulty swallowing the tablet whole, you may crush it and mix in applesauce just prior to taking your dose. ° °Take Xarelto® exactly as prescribed by your doctor and DO NOT stop taking Xarelto® without talking to the doctor who prescribed the medication.  Stopping without other stroke prevention medication to take the place of Xarelto® may increase your risk of developing a clot that causes a stroke.  Refill your prescription before you run out. ° °After discharge, you should have regular check-up appointments with your healthcare provider that is prescribing your Xarelto®.  In the future your dose may need to be changed if your kidney function or weight changes by a significant amount. ° °What do you do if you miss a dose? °If you are taking Xarelto® ONCE DAILY and you miss a dose, take   it as soon as you remember on the same day  then continue your regularly scheduled once daily regimen the next day. Do not take two doses of Xarelto® at the same time or on the same day.  ° °Important Safety Information °A possible side effect of Xarelto® is bleeding. You should call your healthcare provider right away if you experience any of the following: °? Bleeding from an injury or your nose that does not stop. °? Unusual colored urine (red or dark brown) or unusual colored stools (red or black). °? Unusual bruising for unknown reasons. °? A serious fall or if you hit your head (even if there is no bleeding). ° °Some medicines may interact with Xarelto® and might increase your risk of bleeding while on Xarelto®. To help avoid this, consult your healthcare provider or pharmacist prior to using any new prescription or non-prescription medications, including herbals, vitamins, non-steroidal anti-inflammatory drugs (NSAIDs) and supplements. ° °This website has more information on Xarelto®: www.xarelto.com. ° ° °

## 2016-10-30 ENCOUNTER — Encounter (HOSPITAL_COMMUNITY): Payer: Self-pay

## 2016-10-30 DIAGNOSIS — D62 Acute posthemorrhagic anemia: Secondary | ICD-10-CM

## 2016-10-30 LAB — CBC
HCT: 28.9 % — ABNORMAL LOW (ref 39.0–52.0)
Hemoglobin: 9.5 g/dL — ABNORMAL LOW (ref 13.0–17.0)
MCH: 27.9 pg (ref 26.0–34.0)
MCHC: 32.9 g/dL (ref 30.0–36.0)
MCV: 85 fL (ref 78.0–100.0)
Platelets: 244 10*3/uL (ref 150–400)
RBC: 3.4 MIL/uL — ABNORMAL LOW (ref 4.22–5.81)
RDW: 14.1 % (ref 11.5–15.5)
WBC: 14.7 10*3/uL — ABNORMAL HIGH (ref 4.0–10.5)

## 2016-10-30 MED ORDER — DOCUSATE SODIUM 100 MG PO CAPS: 100.0000 mg | ORAL_CAPSULE | Freq: Two times a day (BID) | ORAL | 0 refills | Status: DC

## 2016-10-30 NOTE — Progress Notes (Signed)
Physical Therapy Treatment Patient Details Name: Maurice Golden MRN: [id number] DOB: 1960/11/29 Today's Date: 10/30/2016    History of Present Illness Pt is a 56 y.o. male now s/p Rt TKA. PMH: atrial fibrillation, obesity, HTN, bradycardia.    PT Comments    Patient continues to progress with mobility. Current plan remains appropriate.   Follow Up Recommendations  Home health PT;Supervision for mobility/OOB     Equipment Recommendations  Rolling walker with 5" wheels    Recommendations for Other Services OT consult     Precautions / Restrictions Precautions Precautions: Knee Precaution Comments: reviewed precautions and positioning  Restrictions Weight Bearing Restrictions: Yes RLE Weight Bearing: Weight bearing as tolerated    Mobility  Bed Mobility Overal bed mobility: Needs Assistance Bed Mobility: Supine to Sit;Sit to Supine     Supine to sit: Min guard Sit to supine: Min assist   General bed mobility comments: increased time and effort; cues for technique; assist to bring R LE into bed  Transfers Overall transfer level: Needs assistance Equipment used: Rolling walker (2 wheeled) Transfers: Sit to/from Stand Sit to Stand: Supervision         General transfer comment: supervision for safety; cues for hand placement and technique  Ambulation/Gait Ambulation/Gait assistance: Supervision Ambulation Distance (Feet): 300 Feet Assistive device: Rolling walker (2 wheeled) Gait Pattern/deviations: Step-through pattern;Decreased stance time - right;Decreased weight shift to right;Decreased step length - left;Antalgic Gait velocity: decreased   General Gait Details: cues for posture, proximity of RW, sequencing, and R knee flexion during swing phase; pt with improved gait mechanics and step length symmetry with increased distance   Stairs            Wheelchair Mobility    Modified Rankin (Stroke Patients Only)       Balance Overall balance assessment:  Needs assistance Sitting-balance support: No upper extremity supported Sitting balance-Leahy Scale: Good     Standing balance support: During functional activity Standing balance-Leahy Scale: Fair Standing balance comment: able to stand static without UE support                    Cognition Arousal/Alertness: Awake/alert Behavior During Therapy: WFL for tasks assessed/performed Overall Cognitive Status: Within Functional Limits for tasks assessed                      Exercises      General Comments        Pertinent Vitals/Pain Pain Assessment: Faces Faces Pain Scale: Hurts even more Pain Location: Rt quad with flexion Pain Descriptors / Indicators: Aching;Grimacing;Guarding;Sore    Home Living                      Prior Function            PT Goals (current goals can now be found in the care plan section) Acute Rehab PT Goals Patient Stated Goal: be more active again Progress towards PT goals: Progressing toward goals    Frequency    7X/week      PT Plan Current plan remains appropriate    Co-evaluation             End of Session Equipment Utilized During Treatment: Gait belt Activity Tolerance: Patient tolerated treatment well Patient left: with call bell/phone within reach;in bed     Time: 6295-2841 PT Time Calculation (min) (ACUTE ONLY): 33 min  Charges:  $Gait Training: 8-22 mins $Therapeutic Activity: 8-22 mins  G Codes:      Derek Mound, PTA Pager: [phone]   10/30/2016, 4:47 PM

## 2016-10-30 NOTE — Progress Notes (Signed)
CPAP set up on Auto mode at bedside. Patient said he will put it on by himself later and will call if he needs help.

## 2016-10-30 NOTE — Progress Notes (Signed)
Orthopedic Tech Progress Note Patient Details:  Maurice Golden 1961-07-13 [id number]  Patient ID: Maurice Golden, male   DOB: 05/11/1961, 56 y.o.   MRN: [id number] Pt refused cpm. Will call when ready.  Trinna Post 10/30/2016, 6:01 AM
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
## 2016-10-30 NOTE — Progress Notes (Signed)
Physical Therapy Treatment Patient Details Name: Maurice Golden MRN: [id number] DOB: 01-Aug-1961 Today's Date: 10/30/2016    History of Present Illness Pt is a 56 y.o. male now s/p Rt TKA. PMH: atrial fibrillation, obesity, HTN, bradycardia.    PT Comments    Patient continues to progress gradually toward mobility goals. Pt educated pt on importance of mobility and performing therex to gaining ROM and his knee healing. Reviewed HEP and precautions. Pt reported having tub at home and only intermittent assistance from family. Pt would benefit from OT consult prior to d/c home to maximize independence and safety when performing ADLs. Continue to progress as tolerated with anticipated d/c home with HHPT.    Follow Up Recommendations  Home health PT;Supervision for mobility/OOB     Equipment Recommendations  Rolling walker with 5" wheels    Recommendations for Other Services OT consult     Precautions / Restrictions Precautions Precautions: Knee Precaution Comments: reviewed precautions and positioning  Restrictions Weight Bearing Restrictions: Yes RLE Weight Bearing: Weight bearing as tolerated    Mobility  Bed Mobility Overal bed mobility: Needs Assistance Bed Mobility: Supine to Sit     Supine to sit: Min guard     General bed mobility comments: increased time and effort; cues for technique  Transfers Overall transfer level: Needs assistance Equipment used: Rolling walker (2 wheeled) Transfers: Sit to/from Stand Sit to Stand: Supervision         General transfer comment: supervision for safety; cues for hand placement and technique  Ambulation/Gait Ambulation/Gait assistance: Supervision Ambulation Distance (Feet): 175 Feet Assistive device: Rolling walker (2 wheeled) Gait Pattern/deviations: Step-through pattern;Decreased stance time - right;Decreased weight shift to right;Decreased step length - left;Antalgic Gait velocity: decreased   General Gait Details:  mildly antalgic gait; step through pattern improving; cues for proximity of RW, sequencing, and posture   Stairs            Wheelchair Mobility    Modified Rankin (Stroke Patients Only)       Balance Overall balance assessment: Needs assistance Sitting-balance support: No upper extremity supported Sitting balance-Leahy Scale: Good     Standing balance support: During functional activity Standing balance-Leahy Scale: Fair Standing balance comment: able to stand static without UE support                    Cognition Arousal/Alertness: Awake/alert Behavior During Therapy: WFL for tasks assessed/performed Overall Cognitive Status: Within Functional Limits for tasks assessed                      Exercises Total Joint Exercises Quad Sets: AROM;Right;10 reps Heel Slides: AAROM;Right;10 reps Hip ABduction/ADduction: AROM;Right;10 reps Straight Leg Raises: AAROM;Right;5 reps Goniometric ROM: ~80 degrees flexion in sitting    General Comments        Pertinent Vitals/Pain Pain Assessment: Faces Faces Pain Scale: Hurts even more Pain Location: Rt quad with flexion Pain Descriptors / Indicators: Aching;Grimacing;Guarding;Sore    Home Living                      Prior Function            PT Goals (current goals can now be found in the care plan section) Acute Rehab PT Goals Patient Stated Goal: be more active again Progress towards PT goals: Progressing toward goals    Frequency    7X/week      PT Plan Current plan remains appropriate    Co-evaluation  End of Session Equipment Utilized During Treatment: Gait belt Activity Tolerance: Patient tolerated treatment well Patient left: with call bell/phone within reach;in chair;Other (comment) (R LE in extension)     Time: 5366-4403 PT Time Calculation (min) (ACUTE ONLY): 45 min  Charges:  $Gait Training: 8-22 mins $Therapeutic Exercise: 8-22 mins $Therapeutic  Activity: 8-22 mins                    G Codes:      Derek Mound, PTA Pager: [phone]   10/30/2016, 12:32 PM

## 2016-10-31 NOTE — Progress Notes (Signed)
PT Cancellation Note  Patient Details Name: Ancelmo Glunz MRN: [id number] DOB: [birth year]   Cancelled Treatment:    Reason Eval/Treat Not Completed: Patient declined, no reason specified Pt declined due to fatigue this morning. Pt reported he has been working on therex this am. PT will check on pt later as time allows.    Derek Mound, PTA Pager: [phone]   10/31/2016, 9:20 AM

## 2016-10-31 NOTE — Progress Notes (Signed)
Physical Therapy Treatment Patient Details Name: Maurice Golden MRN: [id number] DOB: 1960/11/11 Today's Date: 10/31/2016    History of Present Illness Pt is a 56 y.o. male now s/p Rt TKA. PMH: atrial fibrillation, obesity, HTN, bradycardia.    PT Comments    Patient continues to progress however limited by pain in R thigh/groin. Pt improved with bed mobility and gait mechanics. Continue to progress as tolerated with anticipated d/c home with HHPT.   Follow Up Recommendations  Home health PT;Supervision for mobility/OOB     Equipment Recommendations  Rolling walker with 5" wheels    Recommendations for Other Services OT consult     Precautions / Restrictions Precautions Precautions: Knee Precaution Comments: reviewed precautions and positioning  Restrictions Weight Bearing Restrictions: Yes RLE Weight Bearing: Weight bearing as tolerated    Mobility  Bed Mobility Overal bed mobility: Needs Assistance Bed Mobility: Supine to Sit;Sit to Supine     Supine to sit: Min guard Sit to supine: Min guard   General bed mobility comments: worked on getting R LE into bed; cues for technique; pt able to "hook" L foot under R ankle and elevate LE into bed  Transfers Overall transfer level: Modified independent Equipment used: Rolling walker (2 wheeled) Transfers: Sit to/from Stand              Ambulation/Gait Ambulation/Gait assistance: Supervision Ambulation Distance (Feet): 150 Feet Assistive device: Rolling walker (2 wheeled) Gait Pattern/deviations: Step-through pattern;Decreased stance time - right;Decreased weight shift to right;Decreased step length - left;Antalgic Gait velocity: decreased   General Gait Details: cues for R knee flexion during swing phase and heel strike; pt with improved posture and use of AD today   Stairs            Wheelchair Mobility    Modified Rankin (Stroke Patients Only)       Balance Overall balance assessment: Needs  assistance Sitting-balance support: No upper extremity supported Sitting balance-Leahy Scale: Good     Standing balance support: During functional activity Standing balance-Leahy Scale: Fair Standing balance comment: able to stand static without UE support                    Cognition Arousal/Alertness: Awake/alert Behavior During Therapy: WFL for tasks assessed/performed Overall Cognitive Status: Within Functional Limits for tasks assessed                      Exercises Total Joint Exercises Knee Flexion: AROM;Right;10 reps;Seated Goniometric ROM: 85 degrees flexion in sitting    General Comments        Pertinent Vitals/Pain Pain Assessment: Faces Faces Pain Scale: Hurts even more Pain Location: Rt quad/groin with flexion Pain Descriptors / Indicators: Aching;Grimacing;Guarding;Sore Pain Intervention(s): Limited activity within patient's tolerance;Monitored during session;Premedicated before session;Repositioned;Heat applied;Ice applied (heat on upper thigh and ice on knee)    Home Living                      Prior Function            PT Goals (current goals can now be found in the care plan section) Acute Rehab PT Goals Patient Stated Goal: go home Progress towards PT goals: Progressing toward goals    Frequency    7X/week      PT Plan Current plan remains appropriate    Co-evaluation             End of Session Equipment Utilized During Treatment: Gait  belt Activity Tolerance: Patient limited by pain Patient left: with call bell/phone within reach;in bed     Time: 1155-1223 PT Time Calculation (min) (ACUTE ONLY): 28 min  Charges:  $Gait Training: 8-22 mins $Therapeutic Activity: 8-22 mins                    G Codes:      Derek Mound, PTA Pager: [phone]   10/31/2016, 1:33 PM

## 2016-10-31 NOTE — Care Management Important Message (Signed)
Important Message  Patient Details  Name: Maurice Golden MRN: [id number] Date of Birth: 22-May-1961   Medicare Important Message Given:  Yes    Shakenya Stoneberg 10/31/2016, 1:20 PM

## 2016-12-15 ENCOUNTER — Ambulatory Visit: Payer: Medicare Other | Admitting: Internal Medicine

## 2017-01-07 ENCOUNTER — Encounter (INDEPENDENT_AMBULATORY_CARE_PROVIDER_SITE_OTHER): Payer: Self-pay

## 2017-01-07 ENCOUNTER — Encounter: Payer: Self-pay | Admitting: Internal Medicine

## 2017-01-07 ENCOUNTER — Ambulatory Visit (INDEPENDENT_AMBULATORY_CARE_PROVIDER_SITE_OTHER): Payer: Medicare Other | Admitting: Internal Medicine

## 2017-01-07 VITALS — BP 138/78 | HR 61 | Ht 75.0 in | Wt 302.4 lb

## 2017-01-07 DIAGNOSIS — I10 Essential (primary) hypertension: Secondary | ICD-10-CM

## 2017-01-07 DIAGNOSIS — I481 Persistent atrial fibrillation: Secondary | ICD-10-CM

## 2017-01-07 DIAGNOSIS — G4733 Obstructive sleep apnea (adult) (pediatric): Secondary | ICD-10-CM

## 2017-01-07 DIAGNOSIS — I4819 Other persistent atrial fibrillation: Secondary | ICD-10-CM

## 2017-01-07 MED ORDER — AMIODARONE HCL 200 MG PO TABS: 100.0000 mg | ORAL_TABLET | ORAL | 3 refills | Status: DC

## 2017-01-07 NOTE — Patient Instructions (Signed)
Medication Instructions:  Your physician has recommended you make the following change in your medication: Decrease Amiodarone to 100 mg every other day.    Labwork: None Ordered   Testing/Procedures: None Ordered   Follow-Up: Your physician recommends that you schedule a follow-up appointment in: 3 months with Dr. Hillis Range.  Any Other Special Instructions Will Be Listed Below (If Applicable).    Low-Sodium Eating Plan Sodium, which is an element that makes up salt, helps you maintain a healthy balance of fluids in your body. Too much sodium can increase your blood pressure and cause fluid and waste to be held in your body. Your health care provider or dietitian may recommend following this plan if you have high blood pressure (hypertension), kidney disease, liver disease, or heart failure. Eating less sodium can help lower your blood pressure, reduce swelling, and protect your heart, liver, and kidneys. What are tips for following this plan? General guidelines   Most people on this plan should limit their sodium intake to 1,500-2,000 mg (milligrams) of sodium each day. Reading food labels   The Nutrition Facts label lists the amount of sodium in one serving of the food. If you eat more than one serving, you must multiply the listed amount of sodium by the number of servings.  Choose foods with less than 140 mg of sodium per serving.  Avoid foods with 300 mg of sodium or more per serving. Shopping   Look for lower-sodium products, often labeled as "low-sodium" or "no salt added."  Always check the sodium content even if foods are labeled as "unsalted" or "no salt added".  Buy fresh foods.  Avoid canned foods and premade or frozen meals.  Avoid canned, cured, or processed meats  Buy breads that have less than 80 mg of sodium per slice. Cooking   Eat more home-cooked food and less restaurant, buffet, and fast food.  Avoid adding salt when cooking. Use salt-free  seasonings or herbs instead of table salt or sea salt. Check with your health care provider or pharmacist before using salt substitutes.  Cook with plant-based oils, such as canola, sunflower, or olive oil. Meal planning   When eating at a restaurant, ask that your food be prepared with less salt or no salt, if possible.  Avoid foods that contain MSG (monosodium glutamate). MSG is sometimes added to [place] food, bouillon, and some canned foods. What foods are recommended? The items listed may not be a complete list. Talk with your dietitian about what dietary choices are best for you. Grains  Low-sodium cereals, including oats, puffed wheat and rice, and shredded wheat. Low-sodium crackers. Unsalted rice. Unsalted pasta. Low-sodium bread. Whole-grain breads and whole-grain pasta. Vegetables  Fresh or frozen vegetables. "No salt added" canned vegetables. "No salt added" tomato sauce and paste. Low-sodium or reduced-sodium tomato and vegetable juice. Fruits  Fresh, frozen, or canned fruit. Fruit juice. Meats and other protein foods  Fresh or frozen (no salt added) meat, poultry, seafood, and fish. Low-sodium canned tuna and salmon. Unsalted nuts. Dried peas, beans, and lentils without added salt. Unsalted canned beans. Eggs. Unsalted nut butters. Dairy  Milk. Soy milk. Cheese that is naturally low in sodium, such as ricotta cheese, fresh mozzarella, or Swiss cheese Low-sodium or reduced-sodium cheese. Cream cheese. Yogurt. Fats and oils  Unsalted butter. Unsalted margarine with no trans fat. Vegetable oils such as canola or olive oils. Seasonings and other foods  Fresh and dried herbs and spices. Salt-free seasonings. Low-sodium mustard and ketchup. Sodium-free  salad dressing. Sodium-free light mayonnaise. Fresh or refrigerated horseradish. Lemon juice. Vinegar. Homemade, reduced-sodium, or low-sodium soups. Unsalted popcorn and pretzels. Low-salt or salt-free chips. What foods are not  recommended? The items listed may not be a complete list. Talk with your dietitian about what dietary choices are best for you. Grains  Instant hot cereals. Bread stuffing, pancake, and biscuit mixes. Croutons. Seasoned rice or pasta mixes. Noodle soup cups. Boxed or frozen macaroni and cheese. Regular salted crackers. Self-rising flour. Vegetables  Sauerkraut, pickled vegetables, and relishes. Olives. [place] fries. Onion rings. Regular canned vegetables (not low-sodium or reduced-sodium). Regular canned tomato sauce and paste (not low-sodium or reduced-sodium). Regular tomato and vegetable juice (not low-sodium or reduced-sodium). Frozen vegetables in sauces. Meats and other protein foods  Meat or fish that is salted, canned, smoked, spiced, or pickled. Bacon, ham, sausage, hotdogs, corned beef, chipped beef, packaged lunch meats, salt pork, jerky, pickled herring, anchovies, regular canned tuna, sardines, salted nuts. Dairy  Processed cheese and cheese spreads. Cheese curds. Blue cheese. Feta cheese. String cheese. Regular cottage cheese. Buttermilk. Canned milk. Fats and oils  Salted butter. Regular margarine. Ghee. Bacon fat. Seasonings and other foods  Onion salt, garlic salt, seasoned salt, table salt, and sea salt. Canned and packaged gravies. Worcestershire sauce. Tartar sauce. Barbecue sauce. Teriyaki sauce. Soy sauce, including reduced-sodium. Steak sauce. Fish sauce. Oyster sauce. Cocktail sauce. Horseradish that you find on the shelf. Regular ketchup and mustard. Meat flavorings and tenderizers. Bouillon cubes. Hot sauce and Tabasco sauce. Premade or packaged marinades. Premade or packaged taco seasonings. Relishes. Regular salad dressings. Salsa. Potato and tortilla chips. Corn chips and puffs. Salted popcorn and pretzels. Canned or dried soups. Pizza. Frozen entrees and pot pies. Summary  Eating less sodium can help lower your blood pressure, reduce swelling, and protect your heart,  liver, and kidneys.  Most people on this plan should limit their sodium intake to 1,500-2,000 mg (milligrams) of sodium each day.  Canned, boxed, and frozen foods are high in sodium. Restaurant foods, fast foods, and pizza are also very high in sodium. You also get sodium by adding salt to food.  Try to cook at home, eat more fresh fruits and vegetables, and eat less fast food, canned, processed, or prepared foods. This information is not intended to replace advice given to you by your health care provider. Make sure you discuss any questions you have with your health care provider. Document Released: 03/21/2002 Document Revised: 09/22/2016 Document Reviewed: 09/22/2016 Elsevier Interactive Patient Education  2017 [employer].   If you need a refill on your cardiac medications before your next appointment, please call your pharmacy.

## 2017-01-07 NOTE — Progress Notes (Signed)
PCP:  Cain Saupe, MD Primary EP:  Dr Johney Frame  The patient presents today for electrophysiology followup.  He has done well since his recent knee surgery.  He is unaware of any afib.  Today, he denies symptoms of palpitations, chest pain, shortness of breath, orthopnea, PND, dizziness, presyncope, syncope, or neurologic sequela.  The patient feels that he is tolerating medications without difficulties and is otherwise without complaint today.   Past Medical History  Diagnosis Date  . Paroxysmal atrial fibrillation   . Obesity   . ANEMIA   . BRADYCARDIA   . OSA (obstructive sleep apnea)   . Bradycardia   . Hypertension    Past Surgical History  Procedure Laterality Date  . Bilateral knee arthroscopy    . Appendectomy    . L eye cataract    . Tee without cardioversion  04/29/2012    Procedure: TRANSESOPHAGEAL ECHOCARDIOGRAM (TEE);  Surgeon: Vesta Mixer, MD;  Location: [facility] ENDOSCOPY;  Service: Cardiovascular;  Laterality: [name];  changed from nish to nahser/dl  . Cardioversion  04/29/2012    Procedure: CARDIOVERSION;  Surgeon: Vesta Mixer, MD;  Location: [facility] ENDOSCOPY;  Service: Cardiovascular;  Laterality: [name];  . Cardioversion [name] 12/21/2013    Procedure: CARDIOVERSION;  Surgeon: Wendall Stade, MD;  Location: [facility] ENDOSCOPY;  Service: Cardiovascular;  Laterality: [name];  . Tee without cardioversion [name] 01/25/2014    Procedure: TRANSESOPHAGEAL ECHOCARDIOGRAM (TEE);  Surgeon: [name], MD;  Location: [facility] ENDOSCOPY;  Service: Cardiovascular;  Laterality: [name];  . Ablation  01/26/14    PVI by Dr Johney Frame  . Cardioversion [name] 05/30/2014    Procedure: CARDIOVERSION;  Surgeon: Laurey Morale, MD;  Location: [facility] ENDOSCOPY;  Service: Cardiovascular;  Laterality: [name];  . Atrial fibrillation ablation [name] 01/26/2014    Procedure: ATRIAL FIBRILLATION ABLATION;  Surgeon: Gardiner Rhyme, MD;  Location: [facility] CATH LAB;  Service: Cardiovascular;  Laterality: [name];    Current Outpatient Prescriptions:   .  amiodarone (PACERONE) 200 MG tablet, Take 0.5 tablets (100 mg total) by mouth daily., Disp: 45 tablet, Rfl: 3 .  amLODipine (NORVASC) 5 MG tablet, Take 1 tablet (5 mg total) by mouth daily., Disp: 90 tablet, Rfl: 3 .  metoprolol succinate (TOPROL-XL) 50 MG 24 hr tablet, Take 2 tablets (100 mg total) by mouth daily. Take with or immediately following a meal., Disp: 180 tablet, Rfl: 3 .  multivitamin (ONE-A-DAY MEN'S) TABS tablet, Take 1 tablet by mouth daily., Disp: , Rfl:  .  rivaroxaban (XARELTO) 20 MG TABS tablet, Take 1 tablet (20 mg total) by mouth daily with supper., Disp: 90 tablet, Rfl: 3   Allergies  Allergen Reactions  . Hydrocodone     Severe itching  . Other     All narcotics - Severe itching     History   Social History  . Marital Status: Single    Spouse Name: [name]  . Number of Children: [name]  . Years of Education: [name]   Occupational History  . retired    Social History Main Topics  . Smoking status: Never Smoker   . Smokeless tobacco: Not on file  . Alcohol Use: No  . Drug Use: No  . Sexual Activity: Yes   Other Topics Concern  . Not on file   Social History Narrative   Lives with mother in [place].  Retired/ disabled from [employer].    Family History  Problem Relation Age of Onset  . Diabetes Mother   . Hypertension Mother   .  Hypertension Father     ROS-  All systems are reviewed and are negative except as outlined in the HPI above  Physical Exam: Vitals:   01/07/17 0949  BP: 138/78  Pulse: 61   GEN- The patient is well appearing, alert and oriented x 3 today.   Head- normocephalic, atraumatic Eyes-  Sclera clear, conjunctiva pink Ears- hearing intact Oropharynx- clear Neck- supple  Lungs- Clear to ausculation bilaterally, normal work of breathing Heart- RRR, no murmurs, rubs or gallops, PMI not laterally displaced GI- soft, NT, ND, + BS Extremities- no clubbing, cyanosis, trace edema Neuro- strength and sensation are intact  ekg  reveals sinus rhythm 61 bpm, PR 260 msec, rsr', nonspecific St/T changes  Assessment and Plan:  1.  Persistent AF Maintaining sinus rhythm with amiodarone 100mg  daily Continue xarleto Will reduce amiodarone to QOD  Labs upon return  2. htn Stable No change required today Lifestyle modification and sodium restriction advised  3. osa Compliance with CPAP is encouraged.  He says that he has been compliant  4. Obesity Body mass index is 37.79 kg/m. Weight loss advised  Return in 3 months  Hillis Range MD, [facility] 01/07/2017 9:57 AM

## 2017-03-14 ENCOUNTER — Encounter (HOSPITAL_COMMUNITY): Payer: Self-pay | Admitting: Orthopedic Surgery

## 2017-03-14 NOTE — Addendum Note (Signed)
Addendum  created 03/14/17 0815 by Neda Willenbring, MD   Sign clinical note    

## 2017-05-07 ENCOUNTER — Other Ambulatory Visit: Payer: Self-pay | Admitting: Orthopedic Surgery

## 2017-05-13 ENCOUNTER — Encounter: Payer: Self-pay | Admitting: Nurse Practitioner

## 2017-05-15 ENCOUNTER — Telehealth: Payer: Self-pay | Admitting: Pharmacist

## 2017-05-15 NOTE — Telephone Encounter (Signed)
Pt takes Xarelto for afib with CHADS2 score of 1 (HTN). Ok to hold Xarelto for 3 days prior to TKA per protocol. Clearance faxed to [phone].

## 2017-05-15 NOTE — Telephone Encounter (Signed)
-----   Message from Hillis Range, MD sent at 05/15/2017 12:46 PM EDT ----- Regarding: FW: Surgical Clearance Contact: [phone] Please make recommendations for anticoagulation per our protocol. Thanks   ----- Message ----- From: Madison Hickman Sent: 05/15/2017   9:13 AM To: Hillis Range, MD, Madison Hickman Subject: Surgical Clearance                             1. Type of surgery: Left Total Knee Arthroplasty  2. Date of surgery: 08/17/2017  3. Surgeon: Dr. Jodi Geralds  4. Medication that needs to be held & how long: Xarelto  5. Fax: [phone].

## 2017-07-08 ENCOUNTER — Ambulatory Visit (INDEPENDENT_AMBULATORY_CARE_PROVIDER_SITE_OTHER): Payer: Medicare Other | Admitting: Internal Medicine

## 2017-07-08 ENCOUNTER — Encounter: Payer: Self-pay | Admitting: Internal Medicine

## 2017-07-08 VITALS — BP 128/82 | HR 79 | Ht 75.0 in | Wt 297.6 lb

## 2017-07-08 DIAGNOSIS — I481 Persistent atrial fibrillation: Secondary | ICD-10-CM

## 2017-07-08 DIAGNOSIS — I10 Essential (primary) hypertension: Secondary | ICD-10-CM | POA: Diagnosis not present

## 2017-07-08 DIAGNOSIS — G4733 Obstructive sleep apnea (adult) (pediatric): Secondary | ICD-10-CM

## 2017-07-08 DIAGNOSIS — I4819 Other persistent atrial fibrillation: Secondary | ICD-10-CM

## 2017-07-08 MED ORDER — AMIODARONE HCL 200 MG PO TABS: 200.0000 mg | ORAL_TABLET | Freq: Two times a day (BID) | ORAL | 3 refills | Status: DC

## 2017-07-08 NOTE — Patient Instructions (Addendum)
Medication Instructions:  . Your physician has recommended you make the following change in your medication:  1) Increase Amiodarone to 200 mg twice daily    Labwork:  Your physician recommends that you return for lab work today: BMP/CBC   Testing/Procedures:  Your physician has recommended that you have a Cardioversion (DCCV). Electrical Cardioversion uses a jolt of electricity to your heart either through paddles or wired patches attached to your chest. This is a controlled, usually prescheduled, procedure. Defibrillation is done under light anesthesia in the hospital, and you usually go home the day of the procedure. This is done to get your heart back into a normal rhythm. You are not awake for the procedure. Please see the instruction sheet given to you today.--07/14/17  Come to the office at 11am for [place] to do an EKG  Please arrive at [facility] Entrance of [facility] at 11:30am Do not eat or drink after midnight the night prior to the procedure Do not take any medications the morning of the test Will need someone to drive you home at discharge       Follow-Up: Your physician recommends that you schedule a follow-up appointment in: 4 weeks with Dr Johney Frame

## 2017-07-08 NOTE — Progress Notes (Signed)
PCP: Patient, No Pcp Per Primary EP: Dr Johney Frame  Maurice Golden is a 56 y.o. male who presents today for routine electrophysiology followup.  He is back in afib but mostly unaware.  Planned for knee surgery 08/17/17.  Since last being seen in our clinic, the patient reports doing very well.  Today, he denies symptoms of palpitations, chest pain, shortness of breath,  lower extremity edema, dizziness, presyncope, or syncope.  The patient is otherwise without complaint today.   Past Medical History:  Diagnosis Date  . ANEMIA    PATIENT DENIES  . Arthritis   . BRADYCARDIA   . Bradycardia   . Dysrhythmia   . Hypertension   . Obesity   . OSA (obstructive sleep apnea)    cpap   . Paroxysmal atrial fibrillation [facility])    Past Surgical History:  Procedure Laterality Date  . ABLATION  01/26/14   PVI by Dr Johney Frame  . APPENDECTOMY    . ATRIAL FIBRILLATION ABLATION [name] 01/26/2014   Procedure: ATRIAL FIBRILLATION ABLATION;  Surgeon: Gardiner Rhyme, MD;  Location: [facility] CATH LAB;  Service: Cardiovascular;  Laterality: [name];  . BILATERAL KNEE ARTHROSCOPY    . CARDIOVERSION  04/29/2012   Procedure: CARDIOVERSION;  Surgeon: Vesta Mixer, MD;  Location: [facility] ENDOSCOPY;  Service: Cardiovascular;  Laterality: [name];  . CARDIOVERSION [name] 12/21/2013   Procedure: CARDIOVERSION;  Surgeon: Wendall Stade, MD;  Location: [facility] ENDOSCOPY;  Service: Cardiovascular;  Laterality: [name];  . CARDIOVERSION [name] 05/30/2014   Procedure: CARDIOVERSION;  Surgeon: Laurey Morale, MD;  Location: [facility] ENDOSCOPY;  Service: Cardiovascular;  Laterality: [name];  . CARDIOVERSION [name] 04/26/2015   Procedure: CARDIOVERSION;  Surgeon: [name], MD;  Location: [facility] ENDOSCOPY;  Service: Cardiovascular;  Laterality: [name];  . CATARACT EXTRACTION Right 2012  . L eye cataract     bilat  . TEE WITHOUT CARDIOVERSION  04/29/2012   Procedure: TRANSESOPHAGEAL ECHOCARDIOGRAM (TEE);  Surgeon: Vesta Mixer, MD;  Location: [facility] ENDOSCOPY;  Service:  Cardiovascular;  Laterality: [name];  changed from nish to nahser/dl  . TEE WITHOUT CARDIOVERSION [name] 01/25/2014   Procedure: TRANSESOPHAGEAL ECHOCARDIOGRAM (TEE);  Surgeon: [name], MD;  Location: [facility] ENDOSCOPY;  Service: Cardiovascular;  Laterality: [name];  . TOTAL KNEE ARTHROPLASTY Right 10/27/2016   Procedure: TOTAL KNEE ARTHROPLASTY;  Surgeon: Jodi Geralds, MD;  Location: [facility] OR;  Service: Orthopedics;  Laterality: Right;    ROS- all systems are reviewed and negatives except as per HPI above  Current Outpatient Prescriptions  Medication Sig Dispense Refill  . amiodarone (PACERONE) 200 MG tablet Take 0.5 tablets (100 mg total) by mouth every other day. 45 tablet 3  . amLODipine (NORVASC) 5 MG tablet Take 1 tablet (5 mg total) by mouth daily. 90 tablet 3  . metoprolol succinate (TOPROL-XL) 50 MG 24 hr tablet Take 2 tablets (100 mg total) by mouth daily. Take with or immediately following a meal. 180 tablet 3  . multivitamin (ONE-A-DAY MEN'S) TABS tablet Take 1 tablet by mouth daily.    . rivaroxaban (XARELTO) 20 MG TABS tablet Take 1 tablet (20 mg total) by mouth daily with supper. 90 tablet 3   No current facility-administered medications for this visit.     Physical Exam: Vitals:   07/08/17 1605  BP: 128/82  Pulse: 79  SpO2: 98%  Weight: 297 lb 9.6 oz (135 kg)  Height: 6\' 3"  (1.905 m)    GEN- The patient is well appearing, alert and oriented x 3 today.  Head- normocephalic, atraumatic Eyes-  Sclera clear, conjunctiva pink Ears- hearing intact Oropharynx- clear Lungs- Clear to ausculation bilaterally, normal work of breathing Heart- irregular rate and rhythm, no murmurs, rubs or gallops, PMI not laterally displaced GI- soft, NT, ND, + BS Extremities- no clubbing, cyanosis, or edema  EKG tracing ordered today is personally reviewed and shows afib, V rate 79 bpm  Assessment and Plan:  1. Persistent afib Now back in afib We discussed options of 1. Rate control long  term, 2. Increasing amiodarone and cardioverting, then continuing amiodarone, or 3. Repeat ablation.  Risks and benefits to each approach was discussed Risks of amiodarone were discussed at length today.  He would prefer to increase his amiodarone for now and avoid rate control or ablation. Increase amiodarone to  BID Cardioversion early next week if still in AF.  (this will allow for 4 weeks of uninterrupted anticoagulation prior to knee surgery) Continue xarelto  2. HTN Stable No change required today  3. OSA Compliance with CPAP encouraged  4. Obesity Body mass index is 37.2 kg/m. Lifestyle modification encouraged  5. preop Ok to proceed with knee surgery if medically indicated.  Will need to wait 4 weeks post cardioversion before holding anticoagulation.  Hillis Range MD, [facility] 07/08/2017 4:13 PM

## 2017-07-14 ENCOUNTER — Ambulatory Visit (HOSPITAL_COMMUNITY): Admission: RE | Admit: 2017-07-14 | Payer: Medicare Other | Source: Ambulatory Visit | Admitting: Cardiology

## 2017-07-14 ENCOUNTER — Encounter: Payer: Self-pay | Admitting: Internal Medicine

## 2017-07-14 ENCOUNTER — Encounter (HOSPITAL_COMMUNITY): Admission: RE | Payer: Self-pay | Source: Ambulatory Visit

## 2017-07-14 SURGERY — CARDIOVERSION
Anesthesia: Monitor Anesthesia Care

## 2017-07-20 ENCOUNTER — Other Ambulatory Visit: Payer: Self-pay | Admitting: Nurse Practitioner

## 2017-07-21 ENCOUNTER — Encounter (HOSPITAL_COMMUNITY)
Admission: RE | Disposition: A | Discharge status: Home / Self Care | Payer: Self-pay | Source: Ambulatory Visit | Attending: Internal Medicine

## 2017-07-21 ENCOUNTER — Encounter (HOSPITAL_COMMUNITY): Payer: Self-pay | Admitting: *Deleted

## 2017-07-21 ENCOUNTER — Ambulatory Visit (HOSPITAL_COMMUNITY): Payer: Medicare Other | Admitting: Anesthesiology

## 2017-07-21 ENCOUNTER — Ambulatory Visit (HOSPITAL_COMMUNITY)
Admission: RE | Admit: 2017-07-21 | Discharge: 2017-07-21 | Disposition: A | Discharge status: Home / Self Care | Payer: Medicare Other | Source: Ambulatory Visit | Attending: Internal Medicine | Admitting: Internal Medicine

## 2017-07-21 ENCOUNTER — Telehealth: Payer: Self-pay | Admitting: Nurse Practitioner

## 2017-07-21 DIAGNOSIS — M1990 Unspecified osteoarthritis, unspecified site: Secondary | ICD-10-CM | POA: Diagnosis not present

## 2017-07-21 DIAGNOSIS — Z79899 Other long term (current) drug therapy: Secondary | ICD-10-CM | POA: Diagnosis not present

## 2017-07-21 DIAGNOSIS — Z885 Allergy status to narcotic agent status: Secondary | ICD-10-CM | POA: Insufficient documentation

## 2017-07-21 DIAGNOSIS — G4733 Obstructive sleep apnea (adult) (pediatric): Secondary | ICD-10-CM | POA: Diagnosis not present

## 2017-07-21 DIAGNOSIS — I481 Persistent atrial fibrillation: Secondary | ICD-10-CM | POA: Diagnosis not present

## 2017-07-21 DIAGNOSIS — Z6837 Body mass index (BMI) 37.0-37.9, adult: Secondary | ICD-10-CM | POA: Insufficient documentation

## 2017-07-21 DIAGNOSIS — Z9989 Dependence on other enabling machines and devices: Secondary | ICD-10-CM | POA: Diagnosis not present

## 2017-07-21 DIAGNOSIS — I10 Essential (primary) hypertension: Secondary | ICD-10-CM | POA: Insufficient documentation

## 2017-07-21 DIAGNOSIS — I480 Paroxysmal atrial fibrillation: Secondary | ICD-10-CM | POA: Diagnosis present

## 2017-07-21 DIAGNOSIS — E669 Obesity, unspecified: Secondary | ICD-10-CM | POA: Insufficient documentation

## 2017-07-21 HISTORY — PX: CARDIOVERSION: SHX1299

## 2017-07-21 LAB — POCT I-STAT 4, (NA,K, GLUC, HGB,HCT)
Glucose, Bld: 98 mg/dL (ref 65–99)
HCT: 37 % — ABNORMAL LOW (ref 39.0–52.0)
Hemoglobin: 12.6 g/dL — ABNORMAL LOW (ref 13.0–17.0)
Potassium: 4.3 mmol/L (ref 3.5–5.1)
Sodium: 141 mmol/L (ref 135–145)

## 2017-07-21 SURGERY — CARDIOVERSION
Anesthesia: General

## 2017-07-21 MED ORDER — PROPOFOL 10 MG/ML IV BOLUS
INTRAVENOUS | Status: DC | PRN
  Administered 2017-07-21: 130 mg via INTRAVENOUS

## 2017-07-21 MED ORDER — LIDOCAINE 2% (20 MG/ML) 5 ML SYRINGE
INTRAMUSCULAR | Status: DC | PRN
  Administered 2017-07-21: 100 mg via INTRAVENOUS

## 2017-07-21 MED ORDER — SODIUM CHLORIDE 0.9 % IV SOLN
INTRAVENOUS | Status: DC | PRN
  Administered 2017-07-21: 13:00:00 via INTRAVENOUS

## 2017-07-21 NOTE — H&P (Signed)
    INTERVAL PROCEDURE H&P  History and Physical Interval Note:  07/21/2017 12:22 PM  Maurice Golden has presented today for their planned procedure. The various methods of treatment have been discussed with the patient and family. After consideration of risks, benefits and other options for treatment, the patient has consented to the procedure.  The patients' outpatient history has been reviewed, patient examined, and no change in status from most recent office note within the past 30 days. I have reviewed the patients' chart and labs and will proceed as planned. Questions were answered to the patient's satisfaction.   [name], MD, [facility]  [place]  [facility]  Attending Cardiologist  Direct Dial: [phone]  Fax: [phone]  Website:  [email or web address] 07/21/2017, 12:22 PM

## 2017-07-21 NOTE — Discharge Instructions (Signed)
Electrical Cardioversion, Care After °This sheet gives you information about how to care for yourself after your procedure. Your health care provider may also give you more specific instructions. If you have problems or questions, contact your health care provider. °What can I expect after the procedure? °After the procedure, it is common to have: °· Some redness on the skin where the shocks were given. ° °Follow these instructions at home: °· Do not drive for 24 hours if you were given a medicine to help you relax (sedative). °· Take over-the-counter and prescription medicines only as told by your health care provider. °· Ask your health care provider how to check your pulse. Check it often. °· Rest for 48 hours after the procedure or as told by your health care provider. °· Avoid or limit your caffeine use as told by your health care provider. °Contact a health care provider if: °· You feel like your heart is beating too quickly or your pulse is not regular. °· You have a serious muscle cramp that does not go away. °Get help right away if: °· You have discomfort in your chest. °· You are dizzy or you feel faint. °· You have trouble breathing or you are short of breath. °· Your speech is slurred. °· You have trouble moving an arm or leg on one side of your body. °· Your fingers or toes turn cold or blue. °This information is not intended to replace advice given to you by your health care provider. Make sure you discuss any questions you have with your health care provider. °Document Released: 07/20/2013 Document Revised: 05/02/2016 Document Reviewed: 04/04/2016 °Elsevier Interactive Patient Education © 2018 Elsevier Inc. ° °

## 2017-07-21 NOTE — Anesthesia Postprocedure Evaluation (Signed)
Anesthesia Post Note  Patient: Maurice Golden  Procedure(s) Performed: CARDIOVERSION ([name] )     Patient location during evaluation: PACU Anesthesia Type: General Level of consciousness: awake and alert Pain management: pain level controlled Vital Signs Assessment: post-procedure vital signs reviewed and stable Respiratory status: spontaneous breathing, nonlabored ventilation, respiratory function stable and patient connected to nasal cannula oxygen Cardiovascular status: blood pressure returned to baseline and stable Postop Assessment: no apparent nausea or vomiting Anesthetic complications: no    Last Vitals:  Vitals:   07/21/17 1311  BP: (!) 157/94  Pulse: (!) 54  Resp: 17  Temp: 36.7 C  SpO2: 100%    Last Pain:  Vitals:   07/21/17 1311  TempSrc: Oral                 [name] EDWARD

## 2017-07-21 NOTE — Anesthesia Preprocedure Evaluation (Signed)
Anesthesia Evaluation  Patient identified by MRN, date of birth, ID band Patient awake    Reviewed: Allergy & Precautions, H&P , Patient's Chart, lab work & pertinent test results, reviewed documented beta blocker date and time   Airway Mallampati: II  TM Distance: >3 FB Neck ROM: full    Dental no notable dental hx.    Pulmonary sleep apnea ,    Pulmonary exam normal breath sounds clear to auscultation       Cardiovascular hypertension, On Medications  Rhythm:regular Rate:Normal     Neuro/Psych    GI/Hepatic   Endo/Other    Renal/GU      Musculoskeletal   Abdominal   Peds  Hematology   Anesthesia Other Findings   Reproductive/Obstetrics                             Anesthesia Physical Anesthesia Plan  ASA: II  Anesthesia Plan: General   Post-op Pain Management:    Induction: Intravenous  PONV Risk Score and Plan: 2 and Ondansetron, Dexamethasone and Treatment may vary due to age or medical condition  Airway Management Planned: Mask  Additional Equipment:   Intra-op Plan:   Post-operative Plan:   Informed Consent: I have reviewed the patients History and Physical, chart, labs and discussed the procedure including the risks, benefits and alternatives for the proposed anesthesia with the patient or authorized representative who has indicated his/her understanding and acceptance.   Dental Advisory Given  Plan Discussed with: CRNA and Surgeon  Anesthesia Plan Comments: (  )        Anesthesia Quick Evaluation

## 2017-07-21 NOTE — Transfer of Care (Signed)
Immediate Anesthesia Transfer of Care Note  Patient: Maurice Golden  Procedure(s) Performed: CARDIOVERSION ([name] )  Patient Location: PACU and Endoscopy Unit  Anesthesia Type:General  Level of Consciousness: awake, patient cooperative and responds to stimulation  Airway & Oxygen Therapy: Patient Spontanous Breathing  Post-op Assessment: Report given to RN and Post -op Vital signs reviewed and stable  Post vital signs: Reviewed and stable  Last Vitals: There were no vitals filed for this visit.  Last Pain: There were no vitals filed for this visit.       Complications: No apparent anesthesia complications

## 2017-07-21 NOTE — Telephone Encounter (Signed)
Received call from patient who states he is having a cardioversion at 1 pm today and asked if he could take his amiodarone and amlodipine. I advised that he should take those with a sip of water. He reports that he has been NPO since midnight. I asked if he could take metoprolol and he states he cannot take it on an empty stomach. He reports that he took prescribed doses of metoprolol and xarelto yesterday. I answered his questions to his satisfaction and he thanked me for my help.

## 2017-07-21 NOTE — CV Procedure (Signed)
   CARDIOVERSION NOTE  Procedure: Electrical Cardioversion Indications:  Atrial Fibrillation  Procedure Details:  Consent: Risks of procedure as well as the alternatives and risks of each were explained to the (patient/caregiver).  Consent for procedure obtained.  Time Out: Verified patient identification, verified procedure, site/side was marked, verified correct patient position, special equipment/implants available, medications/allergies/relevent history reviewed, required imaging and test results available.  Performed  Patient placed on cardiac monitor, pulse oximetry, supplemental oxygen as necessary.  Sedation given: Propofol per anesthesia Pacer pads placed anterior and posterior chest.  Cardioverted 2 time(s).  Cardioverted at 200J x 2 biphasic.  Impression: Findings: Post procedure EKG shows: NSR Complications: None Patient did tolerate procedure well.  Plan: 1. Successful DCCV to NSR after 2 x 200J biphasic shocks.  Time Spent Directly with the Patient:  30 minutes   [name], MD, [facility]  [place]  [facility]  Attending Cardiologist  Direct Dial: [phone]  Fax: [phone]  Website:  [email or web address] 07/21/2017, 1:03 PM

## 2017-07-22 ENCOUNTER — Encounter (HOSPITAL_COMMUNITY): Payer: Self-pay | Admitting: Internal Medicine

## 2017-07-23 ENCOUNTER — Other Ambulatory Visit: Payer: Self-pay | Admitting: *Deleted

## 2017-07-23 DIAGNOSIS — I4819 Other persistent atrial fibrillation: Secondary | ICD-10-CM

## 2017-07-23 MED ORDER — AMIODARONE HCL 200 MG PO TABS: 200.0000 mg | ORAL_TABLET | Freq: Two times a day (BID) | ORAL | 10 refills | Status: DC

## 2017-07-23 NOTE — Telephone Encounter (Signed)
Received a call from check out stating that the patient was here and patient states that his amiodarone dose was changed but the pharmacy did not receive an updated rx. Per last office visit with Dr Johney Frame:  2. Deliah Boston, RN (Registered Nurse) at 07/08/2017 4:30 PM - Signed    Medication Instructions:  . Your physician has recommended you make the following change in your medication:  1) Increase Amiodarone to 200 mg twice daily     Verified patients preferred pharmacy and he is aware that I will send in the rx.

## 2017-08-06 NOTE — Pre-Procedure Instructions (Signed)
Adarrius Lesko  08/06/2017      [id number] - [place], [place] - [address] AT [facility] [address] [place] [address] Phone: [phone] Fax: [phone]  [facility] - [place], [place] - [address] [place] [address] Phone: [phone] Fax: [phone]  [id number] - [place], [place] - [address] AT [facility] [address] [place] [address] Phone: [phone] Fax: [phone]    Your procedure is scheduled on November 5  Report to [facility] Admitting at 1030 A.M.  Call this number if you have problems the morning of surgery:  [phone]   Remember:  Do not eat food or drink liquids after midnight.  Continue all other medications as directed by your physician except follow these medication instructions before surgery   Take these medicines the morning of surgery with A SIP OF WATER  amiodarone (PACERONE) amLODipine (NORVASC)  metoprolol succinate (TOPROL-XL)  7 days prior to surgery STOP taking any Aspirin (unless otherwise instructed by your surgeon), Aleve, Naproxen, Ibuprofen, Motrin, Advil, Goody's, BC's, all herbal medications, fish oil, and all vitamins  FOLLOW PHYSICIANS INSTRUCTIONS ABOUT XARELTO   Do not wear jewelry, make-up or nail polish.  Do not wear lotions, powders, or perfumes, or deoderant.  Do not shave 48 hours prior to surgery.  Men may shave face and neck.  Do not bring valuables to the hospital.  [facility] is not responsible for any belongings or valuables.  Contacts, dentures or bridgework may not be worn into surgery.  Leave your suitcase in the car.  After surgery it may be brought to your room.  For patients admitted to the hospital, discharge time will be determined by your treatment team.  Patients discharged the day of surgery will not be allowed to drive home.    Special  instructions:   [place]- Preparing For Surgery  Before surgery, you can play an important role. Because skin is not sterile, your skin needs to be as free of germs as possible. You can reduce the number of germs on your skin by washing with CHG (chlorahexidine gluconate) Soap before surgery.  CHG is an antiseptic cleaner which kills germs and bonds with the skin to continue killing germs even after washing.  Please do not use if you have an allergy to CHG or antibacterial soaps. If your skin becomes reddened/irritated stop using the CHG.  Do not shave (including legs and underarms) for at least 48 hours prior to first CHG shower. It is OK to shave your face.  Please follow these instructions carefully.   1. Shower the NIGHT BEFORE SURGERY and the MORNING OF SURGERY with CHG.   2. If you chose to wash your hair, wash your hair first as usual with your normal shampoo.  3. After you shampoo, rinse your hair and body thoroughly to remove the shampoo.  4. Use CHG as you would any other liquid soap. You can apply CHG directly to the skin and wash gently with a scrungie or a clean washcloth.   5. Apply the CHG Soap to your body ONLY FROM THE NECK DOWN.  Do not use on open wounds or open sores. Avoid contact with your eyes, ears, mouth and genitals (private parts). Wash Face and genitals (private parts)  with your normal soap.  6. Wash thoroughly, paying special attention to the area where your surgery will be performed.  7. Thoroughly rinse your body with warm water from the neck down.  8. DO NOT shower/wash with your normal soap after using and rinsing off the CHG Soap.  9. Pat yourself dry with a CLEAN TOWEL.  10. Wear CLEAN PAJAMAS to bed the night before surgery, wear comfortable clothes the morning of surgery  11. Place CLEAN SHEETS on your bed the night of your first shower and DO NOT SLEEP WITH PETS.    Day of Surgery: Do not apply any deodorants/lotions. Please wear clean  clothes to the hospital/surgery center.      Please read over the following fact sheets that you were given.

## 2017-08-07 ENCOUNTER — Inpatient Hospital Stay (HOSPITAL_COMMUNITY)
Admission: RE | Admit: 2017-08-07 | Discharge: 2017-08-07 | Disposition: A | Discharge status: Home / Self Care | Payer: Medicare Other | Source: Ambulatory Visit

## 2017-08-07 ENCOUNTER — Ambulatory Visit: Payer: Medicare Other | Admitting: Internal Medicine

## 2017-08-07 NOTE — Progress Notes (Signed)
Pt sts he is cancelling his surgery scheduled for 08/17/17.

## 2017-08-12 ENCOUNTER — Encounter: Payer: Self-pay | Admitting: Internal Medicine

## 2017-08-12 ENCOUNTER — Ambulatory Visit (INDEPENDENT_AMBULATORY_CARE_PROVIDER_SITE_OTHER): Payer: Medicare Other | Admitting: Internal Medicine

## 2017-08-12 ENCOUNTER — Encounter (INDEPENDENT_AMBULATORY_CARE_PROVIDER_SITE_OTHER): Payer: Self-pay

## 2017-08-12 VITALS — BP 130/90 | HR 50 | Ht 75.0 in | Wt 305.6 lb

## 2017-08-12 DIAGNOSIS — I481 Persistent atrial fibrillation: Secondary | ICD-10-CM

## 2017-08-12 DIAGNOSIS — I10 Essential (primary) hypertension: Secondary | ICD-10-CM

## 2017-08-12 DIAGNOSIS — G4733 Obstructive sleep apnea (adult) (pediatric): Secondary | ICD-10-CM

## 2017-08-12 DIAGNOSIS — I4819 Other persistent atrial fibrillation: Secondary | ICD-10-CM

## 2017-08-12 MED ORDER — AMIODARONE HCL 200 MG PO TABS: 200.0000 mg | ORAL_TABLET | Freq: Every day | ORAL | 3 refills | Status: DC

## 2017-08-12 NOTE — Patient Instructions (Addendum)
Medication Instructions:  Your physician has recommended you make the following change in your medication:   1.)  Decrease Amiodarone to 200 mg daily   -- If you need a refill on your cardiac medications before your next appointment, please call your pharmacy. --  Labwork: None ordered  Testing/Procedures: None ordered  Follow-Up: Your physician wants you to follow-up in: 3 months with Dr. Johney Frame.   Thank you for choosing [facility]!!   Sigurd Sos, RN [phone]  Any Other Special Instructions Will Be Listed Below (If Applicable).

## 2017-08-12 NOTE — Progress Notes (Signed)
Primary EP: Dr Ginny ForthAllred  Maurice Golden is a 56 y.o. male who presents today for routine electrophysiology followup.  Since his recent cardioversion, the patient reports doing very well.  He has decided to postpone knee surgery to January.  Today, he denies symptoms of palpitations, chest pain, shortness of breath,  lower extremity edema, dizziness, presyncope, or syncope.  The patient is otherwise without complaint today.   Past Medical History:  Diagnosis Date  . ANEMIA    PATIENT DENIES  . Arthritis   . BRADYCARDIA   . Bradycardia   . Dysrhythmia   . Hypertension   . Obesity   . OSA (obstructive sleep apnea)    cpap   . Paroxysmal atrial fibrillation [facility](HCC)    Past Surgical History:  Procedure Laterality Date  . ABLATION  01/26/14   PVI by Dr Johney FrameAllred  . APPENDECTOMY    . ATRIAL FIBRILLATION ABLATION [name] 01/26/2014   Procedure: ATRIAL FIBRILLATION ABLATION;  Surgeon: Gardiner RhymeJames D Emree Locicero, MD;  [facility];  Service: Cardiovascular;  Laterality: [name];  . BILATERAL KNEE ARTHROSCOPY    . CARDIOVERSION  04/29/2012   Procedure: CARDIOVERSION;  Surgeon: Vesta MixerPhilip J Nahser, MD;  [facility];  Service: Cardiovascular;  Laterality: [name];  . CARDIOVERSION [name] 12/21/2013   Procedure: CARDIOVERSION;  Surgeon: Wendall StadePeter C Nishan, MD;  [facility];  Service: Cardiovascular;  Laterality: [name];  . CARDIOVERSION [name] 05/30/2014   Procedure: CARDIOVERSION;  Surgeon: Laurey Moralealton S McLean, MD;  [facility];  Service: Cardiovascular;  Laterality: [name];  . CARDIOVERSION [name] 04/26/2015   Procedure: CARDIOVERSION;  Surgeon: Chrystie NoseKenneth C Hilty, MD;  [facility];  Service: Cardiovascular;  Laterality: [name];  . CARDIOVERSION [name] 07/21/2017   Procedure: CARDIOVERSION;  Surgeon: Chrystie NoseHilty, Kenneth C, MD;  [facility];  Service: Cardiovascular;  Laterality: [name];  . CATARACT EXTRACTION Right 2012  . L eye cataract     bilat  . TEE WITHOUT CARDIOVERSION  04/29/2012   Procedure:  TRANSESOPHAGEAL ECHOCARDIOGRAM (TEE);  Surgeon: Vesta MixerPhilip J Nahser, MD;  [facility];  Service: Cardiovascular;  Laterality: [name];  changed from nish to nahser/dl  . TEE WITHOUT CARDIOVERSION [name] 01/25/2014   Procedure: TRANSESOPHAGEAL ECHOCARDIOGRAM (TEE);  Surgeon: Chrystie NoseKenneth C. Hilty, MD;  [facility];  Service: Cardiovascular;  Laterality: [name];  . TOTAL KNEE ARTHROPLASTY Right 10/27/2016   Procedure: TOTAL KNEE ARTHROPLASTY;  Surgeon: Jodi GeraldsGraves, Zaelyn, MD;  Location: [facility] OR;  Service: Orthopedics;  Laterality: Right;    ROS- all systems are reviewed and negatives except as per HPI above  Current Outpatient Prescriptions  Medication Sig Dispense Refill  . amiodarone (PACERONE) 200 MG tablet Take 1 tablet (200 mg total) by mouth 2 (two) times daily. 60 tablet 10  . amLODipine (NORVASC) 5 MG tablet Take 1 tablet (5 mg total) by mouth daily. 90 tablet 3  . Menthol, Topical Analgesic, (BIOFREEZE EX) Apply 1 application topically as needed (for leg pain).    . metoprolol succinate (TOPROL-XL) 50 MG 24 hr tablet Take 2 tablets (100 mg total) by mouth daily. Take with or immediately following a meal. 180 tablet 3  . multivitamin (ONE-A-DAY MEN'S) TABS tablet Take 1 tablet by mouth daily.    . rivaroxaban (XARELTO) 20 MG TABS tablet Take 1 tablet (20 mg total) by mouth daily with supper. 90 tablet 3   No current facility-administered medications for this visit.     Physical Exam: Vitals:   08/12/17 0914  Weight: (!) 305 lb 9.6 oz (  138.6 kg)  Height: 6\' 3"  (1.905 m)    GEN- The patient is well appearing, alert and oriented x 3 today.   Head- normocephalic, atraumatic Eyes-  Sclera clear, conjunctiva pink Ears- hearing intact Oropharynx- clear Lungs- Clear to ausculation bilaterally, normal work of breathing Heart- Regular rate and rhythm, no murmurs, rubs or gallops, PMI not laterally displaced GI- soft, NT, ND, + BS Extremities- no clubbing, cyanosis, or edema  EKG tracing  ordered today is personally reviewed and shows sinus rhythm 50 bpm, PR 280 msec, QRS 124 msec, Qc 454 msec, nonspecific St/T changes  Assessment and Plan:  1. Persistent afib S/p recent cardioversion Reduce amiodarone to 200mg  daily Continue on xarelto  2. OSA Compliance with CPAP encouraged  3. HTN Stable No change required today  4. Overweight Body mass index is 38.2 kg/m. Lifestyle modification encouraged  5. preop Ok to proceed with knee surgery in January without further CV tesing  Return to see me in 3 months  Hillis Range MD, [facility] 08/12/2017 9:19 AM

## 2017-08-17 ENCOUNTER — Inpatient Hospital Stay (HOSPITAL_COMMUNITY): Admission: RE | Admit: 2017-08-17 | Payer: Medicare Other | Source: Ambulatory Visit | Admitting: Orthopedic Surgery

## 2017-08-17 ENCOUNTER — Encounter (HOSPITAL_COMMUNITY): Admission: RE | Payer: Self-pay | Source: Ambulatory Visit

## 2017-08-17 SURGERY — ARTHROPLASTY, KNEE, TOTAL
Anesthesia: Spinal | Site: Knee | Laterality: Left

## 2017-10-07 ENCOUNTER — Other Ambulatory Visit: Payer: Self-pay | Admitting: Internal Medicine

## 2017-11-08 ENCOUNTER — Other Ambulatory Visit: Payer: Self-pay | Admitting: Internal Medicine

## 2017-11-09 NOTE — Telephone Encounter (Signed)
Xarelto 20mg  refill received; pt is 57 yrs old, wt-138.6kg, last seen by Dr. Johney Frame on 08/12/17, Crea-1.04 on 10/28/16-therefore overdue, pt has an appt with Dr. Johney Frame & I have placed a note on the appt to obtain CBC & BMET on 11/11/17 (this Wednesday) visit, using the labs his CrCl-153.63. Will send in request.

## 2017-11-11 ENCOUNTER — Ambulatory Visit: Payer: Medicare Other | Admitting: Internal Medicine

## 2017-11-26 ENCOUNTER — Ambulatory Visit: Payer: Medicare Other | Admitting: Internal Medicine

## 2017-11-26 ENCOUNTER — Encounter: Payer: Self-pay | Admitting: Internal Medicine

## 2017-11-26 VITALS — BP 132/68 | HR 62 | Ht 75.0 in | Wt 312.0 lb

## 2017-11-26 DIAGNOSIS — Z79899 Other long term (current) drug therapy: Secondary | ICD-10-CM | POA: Diagnosis not present

## 2017-11-26 DIAGNOSIS — I4819 Other persistent atrial fibrillation: Secondary | ICD-10-CM

## 2017-11-26 DIAGNOSIS — I10 Essential (primary) hypertension: Secondary | ICD-10-CM

## 2017-11-26 DIAGNOSIS — I481 Persistent atrial fibrillation: Secondary | ICD-10-CM

## 2017-11-26 DIAGNOSIS — G4733 Obstructive sleep apnea (adult) (pediatric): Secondary | ICD-10-CM | POA: Diagnosis not present

## 2017-11-26 NOTE — Patient Instructions (Addendum)
Medication Instructions:  Your physician recommends that you continue on your current medications as directed. Please refer to the Current Medication list given to you today.  Labwork: You will need blood work today:  BMP, CBC and TSH.  Testing/Procedures: None ordered.  Follow-Up: Your physician wants you to follow-up in: 6 months with Dr. Johney Frame.  You will receive a reminder letter in the mail two months in advance. If you don't receive a letter, please call our office to schedule the follow-up appointment.   Any Other Special Instructions Will Be Listed Below (If Applicable).  If you need a refill on your cardiac medications before your next appointment, please call your pharmacy.

## 2017-11-26 NOTE — Progress Notes (Signed)
PCP: Patient, No Pcp Per   Primary EP: Dr Johney Frame  Maurice Golden is a 57 y.o. male who presents today for routine electrophysiology followup.  Since last being seen in our clinic, the patient reports doing very well.  Today, he denies symptoms of palpitations, chest pain, shortness of breath,  lower extremity edema, dizziness, presyncope, or syncope.  The patient is otherwise without complaint today.   Past Medical History:  Diagnosis Date  . ANEMIA    PATIENT DENIES  . Arthritis   . BRADYCARDIA   . Bradycardia   . Dysrhythmia   . Hypertension   . Obesity   . OSA (obstructive sleep apnea)    cpap   . Paroxysmal atrial fibrillation [facility])    Past Surgical History:  Procedure Laterality Date  . ABLATION  01/26/14   PVI by Dr Johney Frame  . APPENDECTOMY    . ATRIAL FIBRILLATION ABLATION [name] 01/26/2014   Procedure: ATRIAL FIBRILLATION ABLATION;  Surgeon: Gardiner Rhyme, MD;  [facility];  Service: Cardiovascular;  Laterality: [name];  . BILATERAL KNEE ARTHROSCOPY    . CARDIOVERSION  04/29/2012   Procedure: CARDIOVERSION;  Surgeon: Vesta Mixer, MD;  [facility];  Service: Cardiovascular;  Laterality: [name];  . CARDIOVERSION [name] 12/21/2013   Procedure: CARDIOVERSION;  Surgeon: Wendall Stade, MD;  Location: [facility] ENDOSCOPY;  Service: Cardiovascular;  Laterality: [name];  . CARDIOVERSION [name] 05/30/2014   Procedure: CARDIOVERSION;  Surgeon: Laurey Morale, MD;  Location: [facility] ENDOSCOPY;  Service: Cardiovascular;  Laterality: [name];  . CARDIOVERSION [name] 04/26/2015   Procedure: CARDIOVERSION;  Surgeon: [name], MD;  Location: [facility] ENDOSCOPY;  Service: Cardiovascular;  Laterality: [name];  . CARDIOVERSION [name] 07/21/2017   Procedure: CARDIOVERSION;  Surgeon: [name], MD;  Location: [facility] ENDOSCOPY;  Service: Cardiovascular;  Laterality: [name];  . CATARACT EXTRACTION Right 2012  . L eye cataract     bilat  . TEE WITHOUT CARDIOVERSION  04/29/2012   Procedure: TRANSESOPHAGEAL  ECHOCARDIOGRAM (TEE);  Surgeon: Vesta Mixer, MD;  Location: [facility] ENDOSCOPY;  Service: Cardiovascular;  Laterality: [name];  changed from nish to nahser/dl  . TEE WITHOUT CARDIOVERSION [name] 01/25/2014   Procedure: TRANSESOPHAGEAL ECHOCARDIOGRAM (TEE);  Surgeon: [name], MD;  Location: [facility] ENDOSCOPY;  Service: Cardiovascular;  Laterality: [name];  . TOTAL KNEE ARTHROPLASTY Right 10/27/2016   Procedure: TOTAL KNEE ARTHROPLASTY;  Surgeon: Jodi Geralds, MD;  Location: [facility] OR;  Service: Orthopedics;  Laterality: Right;    ROS- all systems are reviewed and negatives except as per HPI above  Current Outpatient Medications  Medication Sig Dispense Refill  . amiodarone (PACERONE) 200 MG tablet Take 1 tablet (200 mg total) by mouth daily. 90 tablet 3  . amLODipine (NORVASC) 5 MG tablet TAKE 1 TABLET (5 MG TOTAL) BY MOUTH DAILY. 90 tablet 2  . Menthol, Topical Analgesic, (BIOFREEZE EX) Apply 1 application topically as needed (for leg pain).    . metoprolol succinate (TOPROL-XL) 50 MG 24 hr tablet TAKE 2 TABLETS (100 MG TOTAL) BY MOUTH DAILY. TAKE WITH OR IMMEDIATELY FOLLOWING A MEAL. 180 tablet 2  . multivitamin (ONE-A-DAY MEN'S) TABS tablet Take 1 tablet by mouth daily.    Carlena Hurl 20 MG TABS tablet TAKE 1 TABLET (20 MG TOTAL) BY MOUTH DAILY WITH SUPPER. 90 tablet 0   No current facility-administered medications for this visit.     Physical Exam: Vitals:   11/26/17 1236  BP: 132/68  Pulse: 62  Weight: (!) 312 lb (141.5  kg)  Height: 6\' 3"  (1.905 m)    GEN- The patient is well appearing, alert and oriented x 3 today.   Head- normocephalic, atraumatic Eyes-  Sclera clear, conjunctiva pink Ears- hearing intact Oropharynx- clear Lungs- Clear to ausculation bilaterally, normal work of breathing Heart- Regular rate and rhythm, no murmurs, rubs or gallops, PMI not laterally displaced GI- soft, NT, ND, + BS Extremities- no clubbing, cyanosis, or edema  EKG tracing ordered today is personally  reviewed and shows sinus rhythm 62 bpm, PR 278 msec, poor R wave progression  Assessment and Plan:  1. Persistent afib Maintaining sinus rhythm with amiodarone Check lfts, tfts today On xarelto--> bmet, cbc today Reduce amiodarone to 100mg  daily upon return Consider repeat ablation if further afib  2. OSA He is compliant with CPAP  3. HTN Stable No change required today bmet today  Return in 6 months  Hillis Range MD, [facility] 11/26/2017 12:40 PM

## 2017-11-27 LAB — BASIC METABOLIC PANEL
BUN/Creatinine Ratio: 14 (ref 9–20)
BUN: 16 mg/dL (ref 6–24)
CO2: 22 mmol/L (ref 20–29)
Calcium: 9.6 mg/dL (ref 8.7–10.2)
Chloride: 103 mmol/L (ref 96–106)
Creatinine, Ser: 1.15 mg/dL (ref 0.76–1.27)
GFR calc Af Amer: 81 mL/min/{1.73_m2} (ref >59)
GFR calc non Af Amer: 70 mL/min/{1.73_m2} (ref >59)
Glucose: 113 mg/dL — ABNORMAL HIGH (ref 65–99)
Potassium: 4.3 mmol/L (ref 3.5–5.2)
Sodium: 141 mmol/L (ref 134–144)

## 2017-11-27 LAB — CBC WITH DIFFERENTIAL/PLATELET
Basophils Absolute: 0 10*3/uL (ref 0.0–0.2)
Basos: 0 %
EOS (ABSOLUTE): 0.2 10*3/uL (ref 0.0–0.4)
Eos: 3 %
Hematocrit: 36 % — ABNORMAL LOW (ref 37.5–51.0)
Hemoglobin: 12.5 g/dL — ABNORMAL LOW (ref 13.0–17.7)
Immature Grans (Abs): 0 10*3/uL (ref 0.0–0.1)
Immature Granulocytes: 0 %
Lymphocytes Absolute: 1.5 10*3/uL (ref 0.7–3.1)
Lymphs: 28 %
MCH: 28.6 pg (ref 26.6–33.0)
MCHC: 34.7 g/dL (ref 31.5–35.7)
MCV: 82 fL (ref 79–97)
Monocytes Absolute: 0.7 10*3/uL (ref 0.1–0.9)
Monocytes: 13 %
Neutrophils Absolute: 2.9 10*3/uL (ref 1.4–7.0)
Neutrophils: 56 %
Platelets: 317 10*3/uL (ref 150–379)
RBC: 4.37 x10E6/uL (ref 4.14–5.80)
RDW: 13.7 % (ref 12.3–15.4)
WBC: 5.2 10*3/uL (ref 3.4–10.8)

## 2017-11-27 LAB — TSH: TSH: 0.649 u[IU]/mL (ref 0.450–4.500)

## 2017-11-30 ENCOUNTER — Telehealth: Payer: Self-pay

## 2017-11-30 NOTE — Telephone Encounter (Signed)
error 

## 2018-02-16 ENCOUNTER — Other Ambulatory Visit: Payer: Self-pay | Admitting: Internal Medicine

## 2018-02-16 NOTE — Telephone Encounter (Signed)
Age 57 years Wt 141.5kg  11/26/2017 Saw Dr Johney Frame on 11/26/2017 11/26/2017 SrCr 1.15 11/26/2017  Hgb 12.5 HCT 36.0 CrCl 141.84 Refill done for Xarelto 20 mg daily as requested

## 2018-04-27 DIAGNOSIS — Z79899 Other long term (current) drug therapy: Secondary | ICD-10-CM | POA: Insufficient documentation

## 2018-04-27 DIAGNOSIS — X509XXA Other and unspecified overexertion or strenuous movements or postures, initial encounter: Secondary | ICD-10-CM | POA: Insufficient documentation

## 2018-04-27 DIAGNOSIS — S39012A Strain of muscle, fascia and tendon of lower back, initial encounter: Secondary | ICD-10-CM | POA: Diagnosis not present

## 2018-04-27 DIAGNOSIS — I10 Essential (primary) hypertension: Secondary | ICD-10-CM | POA: Insufficient documentation

## 2018-04-27 DIAGNOSIS — Y92410 Unspecified street and highway as the place of occurrence of the external cause: Secondary | ICD-10-CM | POA: Diagnosis not present

## 2018-04-27 DIAGNOSIS — Y998 Other external cause status: Secondary | ICD-10-CM | POA: Diagnosis not present

## 2018-04-27 DIAGNOSIS — Z7901 Long term (current) use of anticoagulants: Secondary | ICD-10-CM | POA: Diagnosis not present

## 2018-04-27 DIAGNOSIS — Z96651 Presence of right artificial knee joint: Secondary | ICD-10-CM | POA: Insufficient documentation

## 2018-04-27 DIAGNOSIS — S3992XA Unspecified injury of lower back, initial encounter: Secondary | ICD-10-CM | POA: Diagnosis present

## 2018-04-27 DIAGNOSIS — Y9389 Activity, other specified: Secondary | ICD-10-CM | POA: Diagnosis not present

## 2018-04-28 ENCOUNTER — Emergency Department (HOSPITAL_COMMUNITY)
Admission: EM | Admit: 2018-04-28 | Discharge: 2018-04-28 | Disposition: A | Discharge status: Home / Self Care | Payer: Medicare Other | Attending: Emergency Medicine | Admitting: Emergency Medicine

## 2018-04-28 ENCOUNTER — Encounter (HOSPITAL_COMMUNITY): Payer: Self-pay

## 2018-04-28 DIAGNOSIS — S39012A Strain of muscle, fascia and tendon of lower back, initial encounter: Secondary | ICD-10-CM

## 2018-04-28 MED ORDER — CYCLOBENZAPRINE HCL 10 MG PO TABS: 5.0000 mg | ORAL_TABLET | Freq: Every day | ORAL | 0 refills | Status: AC

## 2018-04-28 NOTE — Discharge Instructions (Addendum)
You may use over-the-counter Acetaminophen (Tylenol), topical muscle creams such as SalonPas, [employer], Bengay, etc. Please stretch, apply heat or ice, and have massage therapy for additional assistance.

## 2018-04-28 NOTE — ED Triage Notes (Signed)
Pt complains of lower back pain after helping someone with a broke down car on Friday, he states that he really didn't feel anything until late Sunday and then today the pain was worse

## 2018-04-28 NOTE — ED Provider Notes (Signed)
[facility]-EMERGENCY DEPT Provider Note  CSN: [id number] Arrival date & time: 04/27/18 2254  Chief Complaint(s) Back Pain  HPI Maurice Golden is a 57 y.o. male   HPI  Here with 2 days of left lumbosacral pain, aching in nature.  Nonradiating.  Exacerbated with twisting and palpation.  Alleviated by immobility.  Has tried placing Biofreeze and taken Tylenol provided mild relief. Patient reports that 4 days ago he helped a bystander push a vehicle to the side of the road.  States that he felt fine after that however 2 days ago his pain began.   He denied any trauma.  Denies any bladder/bowel incontinence.  No lower extremity weakness or loss of sensation.  Denies any fevers or chills.  No dysuria.  No abdominal pain..  On Xarelto for Afib.   Past Medical History Past Medical History:  Diagnosis Date  . ANEMIA    PATIENT DENIES  . Arthritis   . BRADYCARDIA   . Bradycardia   . Dysrhythmia   . Hypertension   . Obesity   . OSA (obstructive sleep apnea)    cpap   . Paroxysmal atrial fibrillation [facility])    Patient Active Problem List   Diagnosis Date Noted  . Postoperative anemia due to acute blood loss 10/30/2016  . Primary osteoarthritis of right knee 10/27/2016  . Dizziness   . Persistent atrial fibrillation (HCC)   . Cardiomyopathy (HCC) 01/25/2014  . Abnormal EKG 11/17/2011  . Atrial fibrillation (HCC) 09/10/2011  . Obstructive sleep apnea 02/05/2010  . OBESITY, MORBID 02/04/2010  . ANEMIA 02/04/2010  . Essential hypertension 02/04/2010  . BRADYCARDIA 02/04/2010   Home Medication(s) Prior to Admission medications   Medication Sig Start Date End Date Taking? Authorizing Provider  amiodarone (PACERONE) 200 MG tablet Take 1 tablet (200 mg total) by mouth daily. 08/12/17   Allred, Fayrene Fearing, MD  amLODipine (NORVASC) 5 MG tablet TAKE 1 TABLET (5 MG TOTAL) BY MOUTH DAILY. 10/07/17   Allred, Fayrene Fearing, MD  cyclobenzaprine (FLEXERIL) 10 MG tablet Take 0.5-1 tablets  (5-10 mg total) by mouth at bedtime for 10 days. 04/28/18 05/08/18  Nira Conn, MD  Menthol, Topical Analgesic, (BIOFREEZE EX) Apply 1 application topically as needed (for leg pain).    [provider]  metoprolol succinate (TOPROL-XL) 50 MG 24 hr tablet TAKE 2 TABLETS (100 MG TOTAL) BY MOUTH DAILY. TAKE WITH OR IMMEDIATELY FOLLOWING A MEAL. 10/07/17   Allred, Fayrene Fearing, MD  multivitamin (ONE-A-DAY MEN'S) TABS tablet Take 1 tablet by mouth daily.    [provider]  XARELTO 20 MG TABS tablet TAKE 1 TABLET (20 MG TOTAL) BY MOUTH DAILY WITH SUPPER. 02/16/18   Hillis Range, MD                                                                                                                                    Past Surgical History Past Surgical History:  Procedure Laterality Date  . ABLATION  01/26/14   PVI by Dr Johney Frame  . APPENDECTOMY    . ATRIAL FIBRILLATION ABLATION [name] 01/26/2014   Procedure: ATRIAL FIBRILLATION ABLATION;  Surgeon: Gardiner Rhyme, MD;  [facility]: [facility] CATH LAB;  Service: Cardiovascular;  Laterality: [name];  . BILATERAL KNEE ARTHROSCOPY    . CARDIOVERSION  04/29/2012   Procedure: CARDIOVERSION;  Surgeon: Vesta Mixer, MD;  Location: [facility] ENDOSCOPY;  Service: Cardiovascular;  Laterality: [name];  . CARDIOVERSION [name] 12/21/2013   Procedure: CARDIOVERSION;  Surgeon: Wendall Stade, MD;  Location: [facility] ENDOSCOPY;  Service: Cardiovascular;  Laterality: [name];  . CARDIOVERSION [name] 05/30/2014   Procedure: CARDIOVERSION;  Surgeon: Laurey Morale, MD;  Location: [facility] ENDOSCOPY;  Service: Cardiovascular;  Laterality: [name];  . CARDIOVERSION [name] 04/26/2015   Procedure: CARDIOVERSION;  Surgeon: [name], MD;  Location: [facility] ENDOSCOPY;  Service: Cardiovascular;  Laterality: [name];  . CARDIOVERSION [name] 07/21/2017   Procedure: CARDIOVERSION;  Surgeon: [name], MD;  Location: [facility] ENDOSCOPY;  Service: Cardiovascular;  Laterality: [name];  . CATARACT EXTRACTION Right 2012  . L eye  cataract     bilat  . TEE WITHOUT CARDIOVERSION  04/29/2012   Procedure: TRANSESOPHAGEAL ECHOCARDIOGRAM (TEE);  Surgeon: Vesta Mixer, MD;  Location: [facility] ENDOSCOPY;  Service: Cardiovascular;  Laterality: [name];  changed from nish to nahser/dl  . TEE WITHOUT CARDIOVERSION [name] 01/25/2014   Procedure: TRANSESOPHAGEAL ECHOCARDIOGRAM (TEE);  Surgeon: [name], MD;  Location: [facility] ENDOSCOPY;  Service: Cardiovascular;  Laterality: [name];  . TOTAL KNEE ARTHROPLASTY Right 10/27/2016   Procedure: TOTAL KNEE ARTHROPLASTY;  Surgeon: Jodi Geralds, MD;  Location: [facility] OR;  Service: Orthopedics;  Laterality: Right;   Family History Family History  Problem Relation Age of Onset  . Diabetes Mother   . Hypertension Mother   . Hypertension Father     Social History Social History   Tobacco Use  . Smoking status: Never Smoker  . Smokeless tobacco: Never Used  Substance Use Topics  . Alcohol use: No  . Drug use: No   Allergies Hydrocodone and Other  Review of Systems Review of Systems As noted in HPI Physical Exam Vital Signs  I have reviewed the triage vital signs BP (!) 161/93 (BP Location: Left Arm)   Pulse 60   Temp 98.5 F (36.9 C) (Oral)   Resp 14   Ht 6\' 4"  (1.93 m)   Wt (!) 140.6 kg (310 lb)   SpO2 98%   BMI 37.73 kg/m   Physical Exam  Constitutional: He is oriented to person, place, and time. He appears well-developed and well-nourished. No distress.  HENT:  Head: Normocephalic and atraumatic.  Right Ear: External ear normal.  Left Ear: External ear normal.  Nose: Nose normal.  Mouth/Throat: Mucous membranes are normal. No trismus in the jaw.  Eyes: Conjunctivae and EOM are normal. No scleral icterus.  Neck: Normal range of motion and phonation normal.  Cardiovascular: Normal rate and regular rhythm.  Pulmonary/Chest: Effort normal. No stridor. No respiratory distress.  Abdominal: He exhibits no distension.  Musculoskeletal: Normal range of motion. He exhibits no edema.        Lumbar back: He exhibits tenderness and spasm. He exhibits no bony tenderness.       Back:  Neurological: He is alert and oriented to person, place, and time.  Spine Exam: Strength: 5/5 throughout LE bilaterally (hip flexion/extension, adduction/abduction; knee flexion/extension; foot dorsiflexion/plantarflexion, inversion/eversion; great toe inversion) Sensation: Intact to light touch in proximal  and distal LE bilaterally    Skin: He is not diaphoretic.  Psychiatric: He has a normal mood and affect. His behavior is normal.  Vitals reviewed.   ED Results and Treatments Labs (all labs ordered are listed, but only abnormal results are displayed) Labs Reviewed  URINALYSIS, ROUTINE W REFLEX MICROSCOPIC                                                                                                                         EKG  EKG Interpretation  Date/Time:    Ventricular Rate:    PR Interval:    QRS Duration:   QT Interval:    QTC Calculation:   R Axis:     Text Interpretation:        Radiology No results found. Pertinent labs & imaging results that were available during my care of the patient were reviewed by me and considered in my medical decision making (see chart for details).  Medications Ordered in ED Medications - No data to display                                                                                                                                  Procedures Procedures  (including critical care time)  Medical Decision Making / ED Course I have reviewed the nursing notes for this encounter and the patient's prior records (if available in EHR or on provided paperwork).    57 y.o. male presents with back pain in lumbar area for 2 days without signs of radicular pain. No acute traumatic onset. No red flag symptoms of fever, weight loss, saddle anesthesia, weakness, fecal/urinary incontinence or urinary retention.   Suspect MSK etiology. No  indication for imaging emergently. Patient was recommended to take short course of scheduled NSAIDs and engage in early mobility as definitive treatment. Return precautions discussed for worsening or new concerning symptoms.    Final Clinical Impression(s) / ED Diagnoses Final diagnoses:  Strain of lumbar region, initial encounter    Disposition: Discharge  Condition: Good  I have discussed the results, Dx and Tx plan with the patient who expressed understanding and agree(s) with the plan. Discharge instructions discussed at great length. The patient was given strict return precautions who verbalized understanding of the instructions. No further questions at time of discharge.    ED Discharge Orders        Ordered    cyclobenzaprine (  FLEXERIL) 10 MG tablet  Daily at bedtime     04/28/18 0444       Follow Up: Primary care provider   In 1 to 2 weeks, If symptoms do not improve or  worsen     This chart was dictated using voice recognition software.  Despite best efforts to proofread,  errors can occur which can change the documentation meaning.   Nira Conn, MD 04/28/18 [phone]

## 2018-05-25 ENCOUNTER — Other Ambulatory Visit: Payer: Self-pay | Admitting: Internal Medicine

## 2018-05-25 DIAGNOSIS — I4819 Other persistent atrial fibrillation: Secondary | ICD-10-CM

## 2018-08-16 ENCOUNTER — Other Ambulatory Visit: Payer: Self-pay | Admitting: Internal Medicine

## 2018-08-16 NOTE — Telephone Encounter (Signed)
Xarelto 20mg  refill request received; pt is 57 yrs old, wt-141.5kg, Crea-1.15 on 11/26/17, last seen by Dr. Johney Frame on 11/26/17, CrCl-141.47ml/min; will send in refill to requested pharmacy.

## 2018-08-29 ENCOUNTER — Other Ambulatory Visit: Payer: Self-pay | Admitting: Internal Medicine

## 2018-08-30 ENCOUNTER — Ambulatory Visit: Payer: Medicare Other | Admitting: Internal Medicine

## 2018-08-30 ENCOUNTER — Encounter: Payer: Self-pay | Admitting: Internal Medicine

## 2018-08-30 VITALS — BP 142/72 | HR 58 | Ht 76.0 in | Wt 302.0 lb

## 2018-08-30 DIAGNOSIS — I10 Essential (primary) hypertension: Secondary | ICD-10-CM | POA: Diagnosis not present

## 2018-08-30 DIAGNOSIS — G4733 Obstructive sleep apnea (adult) (pediatric): Secondary | ICD-10-CM

## 2018-08-30 DIAGNOSIS — I4819 Other persistent atrial fibrillation: Secondary | ICD-10-CM | POA: Diagnosis not present

## 2018-08-30 MED ORDER — AMIODARONE HCL 200 MG PO TABS: 100.0000 mg | ORAL_TABLET | Freq: Every day | ORAL | 3 refills | Status: DC

## 2018-08-30 NOTE — Patient Instructions (Addendum)
Medication Instructions:  Your physician has recommended you make the following change in your medication:   1.  Reduce your amiodarone 200 mg--- Take 1/2 tablet by mouth daily  Labwork: You will get lab work tomorrow :  Bmp, tsh, and liver panel  Testing/Procedures: None ordered.  Follow-Up: Your physician wants you to follow-up in: 6 months with Dr. Johney Frame.    Any Other Special Instructions Will Be Listed Below (If Applicable).  If you need a refill on your cardiac medications before your next appointment, please call your pharmacy.

## 2018-08-30 NOTE — Progress Notes (Signed)
PCP: Patient, No Pcp Per   Primary EP: Dr Johney Frame  Maurice Golden is a 57 y.o. male who presents today for routine electrophysiology followup.  Since last being seen in our clinic, the patient reports doing very well.  Today, he denies symptoms of palpitations, chest pain, shortness of breath,  lower extremity edema, dizziness, presyncope, or syncope.  The patient is otherwise without complaint today.   Past Medical History:  Diagnosis Date  . ANEMIA    PATIENT DENIES  . Arthritis   . Bradycardia   . Hypertension   . Obesity   . OSA (obstructive sleep apnea)    cpap   . Paroxysmal atrial fibrillation [facility])    Past Surgical History:  Procedure Laterality Date  . ABLATION  01/26/14   PVI by Dr Johney Frame  . APPENDECTOMY    . ATRIAL FIBRILLATION ABLATION [name] 01/26/2014   Procedure: ATRIAL FIBRILLATION ABLATION;  Surgeon: Gardiner Rhyme, MD;  Location: [facility] CATH LAB;  Service: Cardiovascular;  Laterality: [name];  . BILATERAL KNEE ARTHROSCOPY    . CARDIOVERSION  04/29/2012   Procedure: CARDIOVERSION;  Surgeon: Vesta Mixer, MD;  Location: [facility] ENDOSCOPY;  Service: Cardiovascular;  Laterality: [name];  . CARDIOVERSION [name] 12/21/2013   Procedure: CARDIOVERSION;  Surgeon: Wendall Stade, MD;  Location: [facility] ENDOSCOPY;  Service: Cardiovascular;  Laterality: [name];  . CARDIOVERSION [name] 05/30/2014   Procedure: CARDIOVERSION;  Surgeon: Laurey Morale, MD;  Location: [facility] ENDOSCOPY;  Service: Cardiovascular;  Laterality: [name];  . CARDIOVERSION [name] 04/26/2015   Procedure: CARDIOVERSION;  Surgeon: [name], MD;  Location: [facility] ENDOSCOPY;  Service: Cardiovascular;  Laterality: [name];  . CARDIOVERSION [name] 07/21/2017   Procedure: CARDIOVERSION;  Surgeon: [name], MD;  Location: [facility] ENDOSCOPY;  Service: Cardiovascular;  Laterality: [name];  . CATARACT EXTRACTION Right 2012  . L eye cataract     bilat  . TEE WITHOUT CARDIOVERSION  04/29/2012   Procedure: TRANSESOPHAGEAL ECHOCARDIOGRAM (TEE);  Surgeon: Vesta Mixer, MD;  Location: [facility] ENDOSCOPY;  Service: Cardiovascular;  Laterality: [name];  changed from nish to nahser/dl  . TEE WITHOUT CARDIOVERSION [name] 01/25/2014   Procedure: TRANSESOPHAGEAL ECHOCARDIOGRAM (TEE);  Surgeon: [name], MD;  Location: [facility] ENDOSCOPY;  Service: Cardiovascular;  Laterality: [name];  . TOTAL KNEE ARTHROPLASTY Right 10/27/2016   Procedure: TOTAL KNEE ARTHROPLASTY;  Surgeon: Jodi Geralds, MD;  Location: [facility] OR;  Service: Orthopedics;  Laterality: Right;    ROS- all systems are reviewed and negatives except as per HPI above  Current Outpatient Medications  Medication Sig Dispense Refill  . amiodarone (PACERONE) 200 MG tablet Take 1 tablet (200 mg total) by mouth daily. 90 tablet 3  . amLODipine (NORVASC) 5 MG tablet TAKE 1 TABLET (5 MG TOTAL) BY MOUTH DAILY. 90 tablet 0  . Menthol, Topical Analgesic, (BIOFREEZE EX) Apply 1 application topically as needed (for leg pain).    . metoprolol succinate (TOPROL-XL) 50 MG 24 hr tablet TAKE 2 TABLETS (100 MG TOTAL) BY MOUTH DAILY. TAKE WITH OR IMMEDIATELY FOLLOWING A MEAL. 180 tablet 0  . multivitamin (ONE-A-DAY MEN'S) TABS tablet Take 1 tablet by mouth daily.    Carlena Hurl 20 MG TABS tablet TAKE 1 TABLET (20 MG TOTAL) BY MOUTH DAILY WITH SUPPER. 30 tablet 5   No current facility-administered medications for this visit.     Physical Exam: Vitals:   08/30/18 1410  BP: (!) 142/72  Pulse: (!) 58  SpO2: 97%  Weight: (!) 302 lb (137 kg)  Height:  6\' 4"  (1.93 m)    GEN- The patient is well appearing, alert and oriented x 3 today.   Head- normocephalic, atraumatic Eyes-  Sclera clear, conjunctiva pink Ears- hearing intact Oropharynx- clear Lungs- Clear to ausculation bilaterally, normal work of breathing Heart- Regular rate and rhythm, no murmurs, rubs or gallops, PMI not laterally displaced GI- soft, NT, ND, + BS Extremities- no clubbing, cyanosis, or edema  Wt Readings from Last 3 Encounters:  08/30/18 (!) 302 lb (137 kg)    04/28/18 (!) 310 lb (140.6 kg)  11/26/17 (!) 312 lb (141.5 kg)    EKG tracing ordered today is personally reviewed and shows sinus rhythm 58 bpm, PR 284 msec, QRS 118 msec, QTc 445 msec  Assessment and Plan:  1. Persistent atrial fibrillation Maintaining sinus rhythm with amiodarone Check lfts, tfts today Reduce amiodarone to 100mg  daily Consider repeat ablation if further AF  2. HTN Stable No change required today  3. OSA Compliant with CPAP  Return in 6 months  Hillis Range MD, [facility] 08/30/2018 2:17 PM

## 2018-08-31 ENCOUNTER — Other Ambulatory Visit: Payer: Medicare Other

## 2018-10-04 ENCOUNTER — Other Ambulatory Visit: Payer: Self-pay | Admitting: Internal Medicine

## 2018-10-04 MED ORDER — AMLODIPINE BESYLATE 5 MG PO TABS: 5.0000 mg | ORAL_TABLET | Freq: Every day | ORAL | 0 refills | Status: DC

## 2018-10-27 ENCOUNTER — Telehealth: Payer: Self-pay | Admitting: Internal Medicine

## 2018-10-27 NOTE — Telephone Encounter (Signed)
Only have 1 pill left of my Amiodarone.  I have been taking a whole pill and realize I should have been taking 1/2 a pill.

## 2018-10-28 MED ORDER — AMIODARONE HCL 200 MG PO TABS: 100.0000 mg | ORAL_TABLET | Freq: Every day | ORAL | 3 refills | Status: DC

## 2018-10-28 NOTE — Telephone Encounter (Signed)
Pt in error continued on amiodarone 200 mg daily after last office visit instead of reducing to half tablet daily.  Refill sent.  Advised to reduce to 1/2 tablet daily.

## 2018-11-28 ENCOUNTER — Other Ambulatory Visit: Payer: Self-pay | Admitting: Internal Medicine

## 2018-12-21 ENCOUNTER — Other Ambulatory Visit: Payer: Self-pay | Admitting: Internal Medicine

## 2018-12-21 NOTE — Telephone Encounter (Signed)
Xarelto 20mg  refill request received; pt is 58 yrs old, wt-137kg, Crea-1.15 on 11/26/2017 and needs updated labs-therefore, placed a note on pt's 02/28/2019 appt with Dr. Johney Frame to obtain CMET, last seen by Dr. Johney Frame on 08/30/2018, CrCl-135.26ml/min; will send in a refill to requested pharmacy.

## 2019-02-24 ENCOUNTER — Telehealth: Payer: Self-pay

## 2019-02-24 NOTE — Telephone Encounter (Signed)
Spoke with pt regarding appt on 02/28/19. Pt stated he is not able to check his vitals. Pt concerns were address.

## 2019-02-28 ENCOUNTER — Telehealth: Payer: Medicare Other | Admitting: Internal Medicine

## 2019-02-28 ENCOUNTER — Encounter: Payer: Self-pay | Admitting: Internal Medicine

## 2019-02-28 VITALS — Ht 76.0 in | Wt 305.0 lb

## 2019-02-28 NOTE — Progress Notes (Signed)
No show

## 2019-03-01 ENCOUNTER — Telehealth: Payer: Self-pay

## 2019-03-01 NOTE — Telephone Encounter (Signed)
Spoke with pt regarding appt on 03/04/19. Pt stated he would get a family member to help him log in MyChart. Pt stated he did not have any questions at this time.

## 2019-03-04 ENCOUNTER — Telehealth (INDEPENDENT_AMBULATORY_CARE_PROVIDER_SITE_OTHER): Payer: Medicare Other | Admitting: Internal Medicine

## 2019-03-04 ENCOUNTER — Encounter: Payer: Self-pay | Admitting: Internal Medicine

## 2019-03-04 VITALS — Ht 76.0 in | Wt 305.0 lb

## 2019-03-04 DIAGNOSIS — I4819 Other persistent atrial fibrillation: Secondary | ICD-10-CM

## 2019-03-04 NOTE — Progress Notes (Signed)
No show

## 2019-03-20 ENCOUNTER — Other Ambulatory Visit: Payer: Self-pay | Admitting: Internal Medicine

## 2019-03-21 NOTE — Telephone Encounter (Signed)
Xarelto 20mg  refill request received; pt is 58 yrs old, wt-137kg, Crea-1.15 on 01/04/2019 via [facility] [place] PCP, CrCl-135.67ml/min, last seen by Dr. Rayann Heman on 08/30/2018 and was scheduled for video visit and NO Showed for it; will send a limited supply and note pt needs to r/s Cardiology appt.

## 2019-05-27 ENCOUNTER — Other Ambulatory Visit: Payer: Self-pay | Admitting: Internal Medicine

## 2019-05-27 NOTE — Telephone Encounter (Signed)
Last OV 03/04/2019 Scr 1.15 01/04/2019 crcl 136 ml/min xarelto 20mg  sent to pharmacy

## 2019-05-29 ENCOUNTER — Other Ambulatory Visit: Payer: Self-pay | Admitting: Internal Medicine

## 2019-11-06 ENCOUNTER — Other Ambulatory Visit: Payer: Self-pay | Admitting: Internal Medicine

## 2019-12-03 ENCOUNTER — Other Ambulatory Visit: Payer: Self-pay | Admitting: Internal Medicine

## 2019-12-05 NOTE — Telephone Encounter (Signed)
Prescription refill request for Xarelto received.   Last office visit: [place], 5/202/2020 Weight: 138.3 kg Age: 59 y.o. Scr: 1.15, 01/04/2019 via [facility] CrCl: 135 ml/min   Prescription refill sent.

## 2019-12-26 ENCOUNTER — Other Ambulatory Visit: Payer: Self-pay | Admitting: Internal Medicine

## 2020-01-05 ENCOUNTER — Encounter: Payer: Self-pay | Admitting: Internal Medicine

## 2020-01-05 ENCOUNTER — Ambulatory Visit (INDEPENDENT_AMBULATORY_CARE_PROVIDER_SITE_OTHER): Payer: Medicare Other | Admitting: Internal Medicine

## 2020-01-05 ENCOUNTER — Other Ambulatory Visit: Payer: Self-pay

## 2020-01-05 VITALS — BP 140/84 | HR 63 | Ht 75.0 in | Wt 317.8 lb

## 2020-01-05 DIAGNOSIS — I4819 Other persistent atrial fibrillation: Secondary | ICD-10-CM

## 2020-01-05 DIAGNOSIS — I10 Essential (primary) hypertension: Secondary | ICD-10-CM

## 2020-01-05 DIAGNOSIS — G4733 Obstructive sleep apnea (adult) (pediatric): Secondary | ICD-10-CM

## 2020-01-05 MED ORDER — METOPROLOL SUCCINATE ER 50 MG PO TB24: 50.0000 mg | ORAL_TABLET | Freq: Every day | ORAL | 3 refills | Status: DC

## 2020-01-05 NOTE — Progress Notes (Signed)
PCP: Patient, No Pcp Per   Primary EP: Dr Johney Frame  Maurice Golden is a 59 y.o. male who presents today for routine electrophysiology followup.  Since last being seen in our clinic, the patient reports doing very well.  Today, he denies symptoms of palpitations, chest pain, shortness of breath,  lower extremity edema, dizziness, presyncope, or syncope.  The patient is otherwise without complaint today.   Past Medical History:  Diagnosis Date  . ANEMIA    PATIENT DENIES  . Arthritis   . Bradycardia   . Hypertension   . Obesity   . OSA (obstructive sleep apnea)    cpap   . Paroxysmal atrial fibrillation [facility])    Past Surgical History:  Procedure Laterality Date  . ABLATION  01/26/14   PVI by Dr Johney Frame  . APPENDECTOMY    . ATRIAL FIBRILLATION ABLATION [name] 01/26/2014   Procedure: ATRIAL FIBRILLATION ABLATION;  Surgeon: Gardiner Rhyme, MD;  Location: [facility] CATH LAB;  Service: Cardiovascular;  Laterality: [name];  . BILATERAL KNEE ARTHROSCOPY    . CARDIOVERSION  04/29/2012   Procedure: CARDIOVERSION;  Surgeon: Vesta Mixer, MD;  Location: [facility] ENDOSCOPY;  Service: Cardiovascular;  Laterality: [name];  . CARDIOVERSION [name] 12/21/2013   Procedure: CARDIOVERSION;  Surgeon: Wendall Stade, MD;  Location: [facility] ENDOSCOPY;  Service: Cardiovascular;  Laterality: [name];  . CARDIOVERSION [name] 05/30/2014   Procedure: CARDIOVERSION;  Surgeon: Laurey Morale, MD;  Location: [facility] ENDOSCOPY;  Service: Cardiovascular;  Laterality: [name];  . CARDIOVERSION [name] 04/26/2015   Procedure: CARDIOVERSION;  Surgeon: [name], MD;  Location: [facility] ENDOSCOPY;  Service: Cardiovascular;  Laterality: [name];  . CARDIOVERSION [name] 07/21/2017   Procedure: CARDIOVERSION;  Surgeon: [name], MD;  Location: [facility] ENDOSCOPY;  Service: Cardiovascular;  Laterality: [name];  . CATARACT EXTRACTION Right 2012  . L eye cataract     bilat  . TEE WITHOUT CARDIOVERSION  04/29/2012   Procedure: TRANSESOPHAGEAL ECHOCARDIOGRAM (TEE);  Surgeon: Vesta Mixer, MD;  Location: [facility] ENDOSCOPY;  Service: Cardiovascular;  Laterality: [name];  changed from nish to nahser/dl  . TEE WITHOUT CARDIOVERSION [name] 01/25/2014   Procedure: TRANSESOPHAGEAL ECHOCARDIOGRAM (TEE);  Surgeon: [name], MD;  Location: [facility] ENDOSCOPY;  Service: Cardiovascular;  Laterality: [name];  . TOTAL KNEE ARTHROPLASTY Right 10/27/2016   Procedure: TOTAL KNEE ARTHROPLASTY;  Surgeon: Jodi Geralds, MD;  Location: [facility] OR;  Service: Orthopedics;  Laterality: Right;    ROS- all systems are reviewed and negatives except as per HPI above  Current Outpatient Medications  Medication Sig Dispense Refill  . amiodarone (PACERONE) 200 MG tablet TAKE 1/2 TABLET BY MOUTH DAILY 45 tablet 3  . amLODipine (NORVASC) 5 MG tablet TAKE 1 TABLET BY MOUTH EVERY DAY 90 tablet 2  . Menthol, Topical Analgesic, (BIOFREEZE EX) Apply 1 application topically as needed (for leg pain).    . metoprolol succinate (TOPROL-XL) 50 MG 24 hr tablet TAKE 2 TABLETS (100 MG TOTAL) BY MOUTH DAILY. TAKE WITH OR IMMEDIATELY FOLLOWING A MEAL. 180 tablet 0  . multivitamin (ONE-A-DAY MEN'S) TABS tablet Take 1 tablet by mouth daily.    Carlena Hurl 20 MG TABS tablet TAKE 1 TABLET (20 MG TOTAL) BY MOUTH DAILY WITH SUPPER. 30 tablet 5   No current facility-administered medications for this visit.    Physical Exam: Vitals:   01/05/20 1442  BP: 140/84  Pulse: 63  SpO2: 95%  Weight: (!) 317 lb 12.8 oz (144.2 kg)  Height: 6\' 3"  (1.905 m)  GEN- The patient is well appearing, alert and oriented x 3 today.   Head- normocephalic, atraumatic Eyes-  Sclera clear, conjunctiva pink Ears- hearing intact Oropharynx- clear Lungs- Clear to ausculation bilaterally, normal work of breathing Heart- Regular rate and rhythm, no murmurs, rubs or gallops, PMI not laterally displaced GI- soft, NT, ND, + BS Extremities- no clubbing, cyanosis, or edema  Wt Readings from Last 3 Encounters:  01/05/20 (!) 317 lb 12.8 oz (144.2 kg)  03/04/19  (!) 305 lb (138.3 kg)  02/28/19 (!) 305 lb (138.3 kg)    EKG tracing ordered today is personally reviewed and shows sinus rhythm, PR 270 msec, otherwise normal ekg  Assessment and Plan:  1. Persistent atrial fibrillation He has been a "no show" for multiple visits over the past year The importance of close follow-up to avoid toxicity was discussed today while on amiodarone Check lfts, TSH, PFTs as he is on amiodarone at this time  2. Hypertensive cardiovascular disease Stable Reduce toprol to 50mg  daily due to symptom of erectile dysfunction bmet today  3. OSA Compliant with CPAP  4. Obesity Body mass index is 39.72 kg/m.  Lifestyle modification is advised  5. ED New diagnosis I have offered viagra/ cialis and he declines today Reduce toprol to 50mg  daily  6. First degree AV block Reduce toprol as above Consider weaning further on return  Return to see me in 6 months  Thompson Grayer MD, [facility] 01/05/2020 2:47 PM

## 2020-01-05 NOTE — Patient Instructions (Addendum)
Medication Instructions:  Your physician has recommended you make the following change in your medication:   1.  Reduce your Toprol XL 50 mg-  Take one tablet by mouth daily   Labwork: You will get lab work today:  TSH, liver panel, CBC and BMP  Testing/Procedures: Your physician has recommended that you have a pulmonary function test. Pulmonary Function Tests are a group of tests that measure how well air moves in and out of your lungs.  Please schedule for PFT's   Follow-Up: Your physician wants you to follow-up in: 6 months with Dr. Johney Frame.   You will receive a reminder letter in the mail two months in advance. If you don't receive a letter, please call our office to schedule the follow-up appointment.   Any Other Special Instructions Will Be Listed Below (If Applicable).  If you need a refill on your cardiac medications before your next appointment, please call your pharmacy.

## 2020-01-06 LAB — CBC WITH DIFFERENTIAL/PLATELET
Basophils Absolute: 0.1 10*3/uL (ref 0.0–0.2)
Basos: 1 %
EOS (ABSOLUTE): 0.1 10*3/uL (ref 0.0–0.4)
Eos: 2 %
Hematocrit: 36.5 % — ABNORMAL LOW (ref 37.5–51.0)
Hemoglobin: 12.2 g/dL — ABNORMAL LOW (ref 13.0–17.7)
Immature Grans (Abs): 0 10*3/uL (ref 0.0–0.1)
Immature Granulocytes: 1 %
Lymphocytes Absolute: 1.8 10*3/uL (ref 0.7–3.1)
Lymphs: 32 %
MCH: 28.7 pg (ref 26.6–33.0)
MCHC: 33.4 g/dL (ref 31.5–35.7)
MCV: 86 fL (ref 79–97)
Monocytes Absolute: 0.6 10*3/uL (ref 0.1–0.9)
Monocytes: 11 %
Neutrophils Absolute: 3 10*3/uL (ref 1.4–7.0)
Neutrophils: 53 %
Platelets: 306 10*3/uL (ref 150–450)
RBC: 4.25 x10E6/uL (ref 4.14–5.80)
RDW: 13.4 % (ref 11.6–15.4)
WBC: 5.6 10*3/uL (ref 3.4–10.8)

## 2020-01-06 LAB — BASIC METABOLIC PANEL
BUN/Creatinine Ratio: 17 (ref 9–20)
BUN: 18 mg/dL (ref 6–24)
CO2: 24 mmol/L (ref 20–29)
Calcium: 9.2 mg/dL (ref 8.7–10.2)
Chloride: 101 mmol/L (ref 96–106)
Creatinine, Ser: 1.08 mg/dL (ref 0.76–1.27)
GFR calc Af Amer: 86 mL/min/{1.73_m2} (ref >59)
GFR calc non Af Amer: 75 mL/min/{1.73_m2} (ref >59)
Glucose: 128 mg/dL — ABNORMAL HIGH (ref 65–99)
Potassium: 3.8 mmol/L (ref 3.5–5.2)
Sodium: 140 mmol/L (ref 134–144)

## 2020-01-06 LAB — HEPATIC FUNCTION PANEL
ALT: 12 IU/L (ref 0–44)
AST: 17 IU/L (ref 0–40)
Albumin: 4 g/dL (ref 3.8–4.9)
Alkaline Phosphatase: 76 IU/L (ref 39–117)
Bilirubin Total: 0.6 mg/dL (ref 0.0–1.2)
Bilirubin, Direct: 0.16 mg/dL (ref 0.00–0.40)
Total Protein: 7.5 g/dL (ref 6.0–8.5)

## 2020-01-06 LAB — TSH: TSH: 0.171 u[IU]/mL — ABNORMAL LOW (ref 0.450–4.500)

## 2020-01-09 ENCOUNTER — Telehealth: Payer: Self-pay

## 2020-01-09 DIAGNOSIS — I4819 Other persistent atrial fibrillation: Secondary | ICD-10-CM

## 2020-01-09 DIAGNOSIS — Z79899 Other long term (current) drug therapy: Secondary | ICD-10-CM

## 2020-01-09 NOTE — Telephone Encounter (Signed)
-----   Message from Hillis Range, MD sent at 01/08/2020  1:32 PM EDT ----- Results reviewed.  Boneta Lucks, please inform pt of result.  Please have him come back in for a free T4

## 2020-01-09 NOTE — Telephone Encounter (Signed)
Call placed to Pt.  Advised Pt of results and advised needed another test for his thyroid.  Pt will come 01/13/20 for free T4  Order placed.

## 2020-01-13 ENCOUNTER — Other Ambulatory Visit: Payer: Medicare Other

## 2020-01-16 ENCOUNTER — Other Ambulatory Visit (HOSPITAL_COMMUNITY): Payer: Medicare Other

## 2020-02-02 ENCOUNTER — Ambulatory Visit: Payer: Medicare Other | Admitting: Internal Medicine

## 2020-02-10 ENCOUNTER — Telehealth: Payer: Self-pay | Admitting: Internal Medicine

## 2020-02-10 NOTE — Telephone Encounter (Signed)
Would like Dr. Johney Frame to gave me a call.   Need to discuss disability.

## 2020-02-13 NOTE — Telephone Encounter (Signed)
Returned call to Pt.  Pt had question about disability, but he is working with another doctor on it and he said he will call me back when he needs further assistance.  Requested he ask for this nurse when he calls back.

## 2020-03-02 ENCOUNTER — Other Ambulatory Visit: Payer: Self-pay | Admitting: Internal Medicine

## 2020-03-08 ENCOUNTER — Other Ambulatory Visit: Payer: Self-pay | Admitting: Internal Medicine

## 2020-03-21 ENCOUNTER — Other Ambulatory Visit: Payer: Self-pay | Admitting: Internal Medicine

## 2020-05-08 ENCOUNTER — Other Ambulatory Visit: Payer: Self-pay | Admitting: Internal Medicine

## 2020-05-08 NOTE — Telephone Encounter (Signed)
Pt last saw Dr Johney Frame 01/05/20, last labs 01/05/20 Creat 1.08, age 59, weight 144.2kg, CrCl 150.21, based on CrCl pt is on appropriate dosage of Xarelto 20mg  QD.  Will refill rx.

## 2020-05-23 ENCOUNTER — Telehealth: Payer: Self-pay | Admitting: Internal Medicine

## 2020-05-23 NOTE — Telephone Encounter (Signed)
New Message  Patient is calling in to speak with Maurice Golden. Would not say what it was about specifically, just wants to speak with Maurice Golden or Dr. Jenel Lucks nurse. Please give patient a call back to assist.

## 2020-05-24 ENCOUNTER — Telehealth: Payer: Self-pay | Admitting: *Deleted

## 2020-05-24 NOTE — Telephone Encounter (Signed)
Patient with diagnosis of A Fib on Xarelto for anticoagulation.    Procedure: LEFT TOTAL KNEE ARTHROPLASTY Date of procedure: 06/13/20  CHADS2-VASc score of  1 (HTN)  CrCl 112 mL/min using adjusted body weight  Platelet count 306  Per office protocol, patient can hold Xarelto for 3 days prior to procedure.   Patient will not need bridging with Lovenox (enoxaparin) around procedure.  If not bridging, patient should restart Xarelto on the evening of procedure or day after, at discretion of procedure MD .

## 2020-05-24 NOTE — Telephone Encounter (Signed)
   [facility] Pre-operative Risk Assessment    HEARTCARE STAFF: - Please ensure there is not already an duplicate clearance open for this procedure. - Under Visit Info/Reason for Call, type in Other and utilize the format Clearance MM/DD/YY or Clearance TBD. Do not use dashes or single digits. - If request is for dental extraction, please clarify the # of teeth to be extracted.  Request for surgical clearance:  1. What type of surgery is being performed? LEFT TOTAL KNEE ARTHROPLASTY   2. When is this surgery scheduled? 06/13/20   3. What type of clearance is required (medical clearance vs. Pharmacy clearance to hold med vs. Both)? BOTH  4. Are there any medications that need to be held prior to surgery and how long? [place]   5. Practice name and name of physician performing surgery? [facility]; DR. Jenny Reichmann [place]  6. What is the office phone number? [phone]   7.   What is the office fax number? [phone]  8.   Anesthesia type (None, local, MAC, general) ? SPINAL   Julaine Hua 05/24/2020, 9:00 AM  _________________________________________________________________   (provider comments below)

## 2020-05-24 NOTE — Telephone Encounter (Signed)
   Primary Cardiologist: Dr Johney Frame  Chart reviewed and patient contacted by phone today as part of pre-operative protocol coverage. Given past medical history and time since last visit, based on ACC/AHA guidelines, Dino Borntreger would be at acceptable risk for the planned procedure without further cardiovascular testing.   OK to hold Xarelto 3 days pre op and resume as soon a safe post op. I relayed these instructions to the patient.   I will route this recommendation to the requesting party via Epic fax function and remove from pre-op pool.  Please call with questions.  Corine Shelter, PA-C 05/24/2020, 10:45 AM

## 2020-05-24 NOTE — Telephone Encounter (Signed)
Advised patient that if his knee surgeon needs him to be cleared before that procedure takes place the surgeon office will request that information from [facility]. Also that we are happy to help with this if needed. Verbalized understanding.  Patient to call surgeon's office for more information.

## 2020-05-24 NOTE — Telephone Encounter (Signed)
Pharmacy please addressed anticoagulation in this patient and then we will contact him for cardiac clearance.  Corine Shelter PA-C 05/24/2020 9:28 AM

## 2020-05-30 ENCOUNTER — Other Ambulatory Visit: Payer: Self-pay | Admitting: Orthopedic Surgery

## 2020-06-05 NOTE — Progress Notes (Addendum)
COVID Vaccine Completed: Date COVID Vaccine completed: COVID vaccine manufacturer: Pfizer    [facility] [employer]   PCP - Dawayne Patricia Physician   Cardiologist - Dr. Johney Frame w/cardiac clearance from Corine Shelter, PA-C dated 05-24-20 in EPIC  Chest x-ray -  EKG - 01-05-20 Stress Test -  ECHO -  Cardiac Cath -   Sleep Study -  CPAP -   Fasting Blood Sugar -  Checks Blood Sugar _____ times a day  Blood Thinner Instructions: Xarelto-"Okay to hold 3 days prior pre-op" per Corine Shelter Aspirin Instructions: Last Dose: 06-04-20    Anesthesia review:    ADL's w/o SOB  Patient denies shortness of breath, fever, cough and chest pain at PAT appointment   Patient verbalized understanding of instructions that were given to them at the PAT appointment. Patient was also instructed that they will need to review over the PAT instructions again at home before surgery.

## 2020-06-05 NOTE — Patient Instructions (Addendum)
DUE TO COVID-19 ONLY ONE VISITOR IS ALLOWED TO COME WITH YOU AND STAY IN THE WAITING ROOM ONLY DURING PRE OP AND PROCEDURE DAY OF SURGERY. THE 1 VISITOR  MAY VISIT WITH YOU AFTER SURGERY IN YOUR PRIVATE ROOM DURING VISITING HOURS ONLY!  YOU NEED TO HAVE A COVID 19 TEST ON 06-05-20 @2 :35 PM_, THIS TEST MUST BE DONE BEFORE SURGERY,  COVID TESTING SITE [address] [place] , IT IS ON THE RIGHT GOING OUT [address] APPROXIMATELY  2 MINUTES PAST [employer] ON THE RIGHT. ONCE YOUR COVID TEST IS COMPLETED,  PLEASE BEGIN THE QUARANTINE INSTRUCTIONS AS OUTLINED IN YOUR HANDOUT.                Maurice Golden  06/05/2020   Your procedure is scheduled on: 06-08-20   Report to [facility] Main  Entrance    Report to Admitting at 5:30  AM     Call this number if you have problems the morning of surgery [phone]    REMEMBER:  AFTER MIDNIGHT. CLEAR LIQUIDS UNTIL 4:15 AM .  PLEASE FINISH ENSURE DRINK PER SURGEON ORDER  WHICH NEEDS TO BE COMPLETED AT  4:15 AM .      CLEAR LIQUID DIET   Foods Allowed                                                                    Coffee and tea, regular and decaf                            Fruit ices (not with fruit pulp)                                      Iced Popsicles                                    Carbonated beverages, regular and diet                                    Cranberry, grape and apple juices Sports drinks like Gatorade Lightly seasoned clear broth or consume(fat free) Sugar, honey syrup ___________________________________________________________________      BRUSH YOUR TEETH MORNING OF SURGERY AND RINSE YOUR MOUTH OUT, NO CHEWING GUM CANDY OR MINTS.     Take these medicines the morning of surgery with A SIP OF WATER: Amlodipine (Norvasc)                                You may not have any metal on your body including hair pins and              piercings     Do not wear jewelry, cologne,  lotions, powders or perfumes, deodorant              Men may shave face and neck.   Do not bring valuables to the  hospital. [facility] IS NOT             RESPONSIBLE   FOR VALUABLES.  Contacts, dentures or bridgework may not be worn into surgery.       Patients discharged the day of surgery will not be allowed to drive home. IF YOU ARE HAVING SURGERY AND GOING HOME THE SAME DAY, YOU MUST HAVE AN ADULT TO DRIVE YOU HOME AND BE WITH YOU FOR 24 HOURS. YOU MAY GO HOME BY TAXI OR UBER OR ORTHERWISE, BUT AN ADULT MUST ACCOMPANY YOU HOME AND STAY WITH YOU FOR 24 HOURS.  Name and phone number of your driver: Maurice Golden [phone]  Special Instructions: [name]              Please read over the following fact sheets you were given: _____________________________________________________________________  [facility] - Preparing for Surgery Before surgery, you can play an important role.  Because skin is not sterile, your skin needs to be as free of germs as possible.  You can reduce the number of germs on your skin by washing with CHG (chlorahexidine gluconate) soap before surgery.  CHG is an antiseptic cleaner which kills germs and bonds with the skin to continue killing germs even after washing. Please DO NOT use if you have an allergy to CHG or antibacterial soaps.  If your skin becomes reddened/irritated stop using the CHG and inform your nurse when you arrive at Short Stay. Do not shave (including legs and underarms) for at least 48 hours prior to the first CHG shower.  You may shave your face/neck. Please follow these instructions carefully:  1.  Shower with CHG Soap the night before surgery and the  morning of Surgery.  2.  If you choose to wash your hair, wash your hair first as usual with your  normal  shampoo.  3.  After you shampoo, rinse your hair and body thoroughly to remove the  shampoo.                           4.  Use CHG as you would any other liquid soap.  You can apply chg directly   to the skin and wash                       Gently with a scrungie or clean washcloth.  5.  Apply the CHG Soap to your body ONLY FROM THE NECK DOWN.   Do not use on face/ open                           Wound or open sores. Avoid contact with eyes, ears mouth and genitals (private parts).                       Wash face,  Genitals (private parts) with your normal soap.             6.  Wash thoroughly, paying special attention to the area where your surgery  will be performed.  7.  Thoroughly rinse your body with warm water from the neck down.  8.  DO NOT shower/wash with your normal soap after using and rinsing off  the CHG Soap.                9.  Pat yourself dry with a clean towel.  10.  Wear clean pajamas.            11.  Place clean sheets on your bed the night of your first shower and do not  sleep with pets. Day of Surgery : Do not apply any lotions/deodorants the morning of surgery.  Please wear clean clothes to the hospital/surgery center.  FAILURE TO FOLLOW THESE INSTRUCTIONS MAY RESULT IN THE CANCELLATION OF YOUR SURGERY PATIENT SIGNATURE_________________________________  NURSE SIGNATURE__________________________________  ________________________________________________________________________     Maurice Golden  An incentive spirometer is a tool that can help keep your lungs clear and active. This tool measures how well you are filling your lungs with each breath. Taking long deep breaths may help reverse or decrease the chance of developing breathing (pulmonary) problems (especially infection) following:  A long period of time when you are unable to move or be active. BEFORE THE PROCEDURE   If the spirometer includes an indicator to show your best effort, your nurse or respiratory therapist will set it to a desired goal.  If possible, sit up straight or lean slightly forward. Try not to slouch.  Hold the incentive spirometer in an upright  position. INSTRUCTIONS FOR USE  1. Sit on the edge of your bed if possible, or sit up as far as you can in bed or on a chair. 2. Hold the incentive spirometer in an upright position. 3. Breathe out normally. 4. Place the mouthpiece in your mouth and seal your lips tightly around it. 5. Breathe in slowly and as deeply as possible, raising the piston or the ball toward the top of the column. 6. Hold your breath for 3-5 seconds or for as long as possible. Allow the piston or ball to fall to the bottom of the column. 7. Remove the mouthpiece from your mouth and breathe out normally. 8. Rest for a few seconds and repeat Steps 1 through 7 at least 10 times every 1-2 hours when you are awake. Take your time and take a few normal breaths between deep breaths. 9. The spirometer may include an indicator to show your best effort. Use the indicator as a goal to work toward during each repetition. 10. After each set of 10 deep breaths, practice coughing to be sure your lungs are clear. If you have an incision (the cut made at the time of surgery), support your incision when coughing by placing a pillow or rolled up towels firmly against it. Once you are able to get out of bed, walk around indoors and cough well. You may stop using the incentive spirometer when instructed by your caregiver.  RISKS AND COMPLICATIONS  Take your time so you do not get dizzy or light-headed.  If you are in pain, you may need to take or ask for pain medication before doing incentive spirometry. It is harder to take a deep breath if you are having pain. AFTER USE  Rest and breathe slowly and easily.  It can be helpful to keep track of a log of your progress. Your caregiver can provide you with a simple table to help with this. If you are using the spirometer at home, follow these instructions: SEEK MEDICAL CARE IF:   You are having difficultly using the spirometer.  You have trouble using the spirometer as often as  instructed.  Your pain medication is not giving enough relief while using the spirometer.  You develop fever of 100.5 F (38.1 C) or higher. SEEK IMMEDIATE MEDICAL CARE IF:   You cough  up bloody sputum that had not been present before.  You develop fever of 102 F (38.9 C) or greater.  You develop worsening pain at or near the incision site. MAKE SURE YOU:   Understand these instructions.  Will watch your condition.  Will get help right away if you are not doing well or get worse. Document Released: 02/09/2007 Document Revised: 12/22/2011 Document Reviewed: 04/12/2007 [employer] Patient Information 2014 [employer], [place].   ________________________________________________________________________  WHAT IS A BLOOD TRANSFUSION? Blood Transfusion Information  A transfusion is the replacement of blood or some of its parts. Blood is made up of multiple cells which provide different functions.  Red blood cells carry oxygen and are used for blood loss replacement.  White blood cells fight against infection.  Platelets control bleeding.  Plasma helps clot blood.  Other blood products are available for specialized needs, such as hemophilia or other clotting disorders. BEFORE THE TRANSFUSION  Who gives blood for transfusions?   Healthy volunteers who are fully evaluated to make sure their blood is safe. This is blood bank blood. Transfusion therapy is the safest it has ever been in the practice of medicine. Before blood is taken from a donor, a complete history is taken to make sure that person has no history of diseases nor engages in risky social behavior (examples are intravenous drug use or sexual activity with multiple partners). The donor's travel history is screened to minimize risk of transmitting infections, such as malaria. The donated blood is tested for signs of infectious diseases, such as HIV and hepatitis. The blood is then tested to be sure it is compatible with you in  order to minimize the chance of a transfusion reaction. If you or a relative donates blood, this is often done in anticipation of surgery and is not appropriate for emergency situations. It takes many days to process the donated blood. RISKS AND COMPLICATIONS Although transfusion therapy is very safe and saves many lives, the main dangers of transfusion include:   Getting an infectious disease.  Developing a transfusion reaction. This is an allergic reaction to something in the blood you were given. Every precaution is taken to prevent this. The decision to have a blood transfusion has been considered carefully by your caregiver before blood is given. Blood is not given unless the benefits outweigh the risks. AFTER THE TRANSFUSION  Right after receiving a blood transfusion, you will usually feel much better and more energetic. This is especially true if your red blood cells have gotten low (anemic). The transfusion raises the level of the red blood cells which carry oxygen, and this usually causes an energy increase.  The nurse administering the transfusion will monitor you carefully for complications. HOME CARE INSTRUCTIONS  No special instructions are needed after a transfusion. You may find your energy is better. Speak with your caregiver about any limitations on activity for underlying diseases you may have. SEEK MEDICAL CARE IF:   Your condition is not improving after your transfusion.  You develop redness or irritation at the intravenous (IV) site. SEEK IMMEDIATE MEDICAL CARE IF:  Any of the following symptoms occur over the next 12 hours:  Shaking chills.  You have a temperature by mouth above 102 F (38.9 C), not controlled by medicine.  Chest, back, or muscle pain.  People around you feel you are not acting correctly or are confused.  Shortness of breath or difficulty breathing.  Dizziness and fainting.  You get a rash or develop hives.  You have  a decrease in urine  output.  Your urine turns a dark color or changes to pink, red, or brown. Any of the following symptoms occur over the next 10 days:  You have a temperature by mouth above 102 F (38.9 C), not controlled by medicine.  Shortness of breath.  Weakness after normal activity.  The white part of the eye turns yellow (jaundice).  You have a decrease in the amount of urine or are urinating less often.  Your urine turns a dark color or changes to pink, red, or brown. Document Released: 09/26/2000 Document Revised: 12/22/2011 Document Reviewed: 05/15/2008 [facility] Patient Information 2014 [place], [place].  _______________________________________________________________________

## 2020-06-06 ENCOUNTER — Ambulatory Visit (HOSPITAL_COMMUNITY)
Admission: RE | Admit: 2020-06-06 | Discharge: 2020-06-06 | Disposition: A | Discharge status: Home / Self Care | Payer: Medicare Other | Source: Ambulatory Visit | Attending: Orthopedic Surgery | Admitting: Orthopedic Surgery

## 2020-06-06 ENCOUNTER — Other Ambulatory Visit: Payer: Self-pay

## 2020-06-06 ENCOUNTER — Encounter (HOSPITAL_COMMUNITY): Payer: Self-pay

## 2020-06-06 ENCOUNTER — Other Ambulatory Visit (HOSPITAL_COMMUNITY)
Admission: RE | Admit: 2020-06-06 | Discharge: 2020-06-06 | Disposition: A | Discharge status: Home / Self Care | Payer: Medicare Other | Source: Ambulatory Visit | Attending: Orthopedic Surgery | Admitting: Orthopedic Surgery

## 2020-06-06 ENCOUNTER — Encounter (HOSPITAL_COMMUNITY)
Admission: RE | Admit: 2020-06-06 | Discharge: 2020-06-06 | Disposition: A | Discharge status: Home / Self Care | Payer: Medicare Other | Source: Ambulatory Visit | Attending: Orthopedic Surgery | Admitting: Orthopedic Surgery

## 2020-06-06 DIAGNOSIS — Z20822 Contact with and (suspected) exposure to covid-19: Secondary | ICD-10-CM | POA: Diagnosis not present

## 2020-06-06 DIAGNOSIS — Z01818 Encounter for other preprocedural examination: Secondary | ICD-10-CM | POA: Insufficient documentation

## 2020-06-06 DIAGNOSIS — Z01811 Encounter for preprocedural respiratory examination: Secondary | ICD-10-CM

## 2020-06-06 LAB — BASIC METABOLIC PANEL
Anion gap: 10 (ref 5–15)
BUN: 27 mg/dL — ABNORMAL HIGH (ref 6–20)
CO2: 26 mmol/L (ref 22–32)
Calcium: 9.5 mg/dL (ref 8.9–10.3)
Chloride: 101 mmol/L (ref 98–111)
Creatinine, Ser: 1.15 mg/dL (ref 0.61–1.24)
GFR calc Af Amer: >60 mL/min (ref >60)
GFR calc non Af Amer: >60 mL/min (ref >60)
Glucose, Bld: 97 mg/dL (ref 70–99)
Potassium: 3.8 mmol/L (ref 3.5–5.1)
Sodium: 137 mmol/L (ref 135–145)

## 2020-06-06 LAB — URINALYSIS, ROUTINE W REFLEX MICROSCOPIC
Bilirubin Urine: NEGATIVE
Glucose, UA: NEGATIVE mg/dL
Hgb urine dipstick: NEGATIVE
Ketones, ur: NEGATIVE mg/dL
Leukocytes,Ua: NEGATIVE
Nitrite: NEGATIVE
Protein, ur: NEGATIVE mg/dL
Specific Gravity, Urine: 1.028 (ref 1.005–1.030)
pH: 5 (ref 5.0–8.0)

## 2020-06-06 LAB — CBC WITH DIFFERENTIAL/PLATELET
Abs Immature Granulocytes: 0.03 10*3/uL (ref 0.00–0.07)
Basophils Absolute: 0 10*3/uL (ref 0.0–0.1)
Basophils Relative: 0 %
Eosinophils Absolute: 0.1 10*3/uL (ref 0.0–0.5)
Eosinophils Relative: 1 %
HCT: 33.3 % — ABNORMAL LOW (ref 39.0–52.0)
Hemoglobin: 10.8 g/dL — ABNORMAL LOW (ref 13.0–17.0)
Immature Granulocytes: 0 %
Lymphocytes Relative: 29 %
Lymphs Abs: 2 10*3/uL (ref 0.7–4.0)
MCH: 28.1 pg (ref 26.0–34.0)
MCHC: 32.4 g/dL (ref 30.0–36.0)
MCV: 86.7 fL (ref 80.0–100.0)
Monocytes Absolute: 0.6 10*3/uL (ref 0.1–1.0)
Monocytes Relative: 9 %
Neutro Abs: 4.2 10*3/uL (ref 1.7–7.7)
Neutrophils Relative %: 61 %
Platelets: 428 10*3/uL — ABNORMAL HIGH (ref 150–400)
RBC: 3.84 MIL/uL — ABNORMAL LOW (ref 4.22–5.81)
RDW: 13.5 % (ref 11.5–15.5)
WBC: 7 10*3/uL (ref 4.0–10.5)
nRBC: 0 % (ref 0.0–0.2)

## 2020-06-06 LAB — PROTIME-INR
INR: 1.2 (ref 0.8–1.2)
Prothrombin Time: 14.5 seconds (ref 11.4–15.2)

## 2020-06-06 LAB — APTT: aPTT: 29 seconds (ref 24–36)

## 2020-06-06 LAB — SURGICAL PCR SCREEN
MRSA, PCR: NEGATIVE
Staphylococcus aureus: NEGATIVE

## 2020-06-06 LAB — SARS CORONAVIRUS 2 (TAT 6-24 HRS): SARS Coronavirus 2: NEGATIVE

## 2020-06-07 MED ORDER — BUPIVACAINE LIPOSOME 1.3 % IJ SUSP
20.0000 mL | Freq: Once | INTRAMUSCULAR | Status: DC
  Filled 2020-06-07: qty 20

## 2020-06-07 MED ORDER — DEXTROSE 5 % IV SOLN
3.0000 g | INTRAVENOUS | Status: AC
  Administered 2020-06-08: 3 g via INTRAVENOUS
  Filled 2020-06-07: qty 3

## 2020-06-07 NOTE — H&P (Signed)
TOTAL KNEE ADMISSION H&P  Patient is being admitted for left total knee arthroplasty.  Subjective:  Chief Complaint:left knee pain.  HPI: Maurice Golden, 59 y.o. male, has a history of pain and functional disability in the left knee due to arthritis and has failed non-surgical conservative treatments for greater than 12 weeks to includeNSAID's and/or analgesics, corticosteriod injections, viscosupplementation injections, weight reduction as appropriate and activity modification.  Onset of symptoms was gradual, starting 8 years ago with gradually worsening course since that time. The patient noted no past surgery on the left knee(s).  Patient currently rates pain in the left knee(s) at 8 out of 10 with activity. Patient has night pain, worsening of pain with activity and weight bearing, pain that interferes with activities of daily living, pain with passive range of motion, crepitus and joint swelling.  Patient has evidence of subchondral cysts, subchondral sclerosis, periarticular osteophytes, joint subluxation and joint space narrowing by imaging studies. This patient has had failure of reasonable conservative care. There is no active infection.  Patient Active Problem List   Diagnosis Date Noted  . Postoperative anemia due to acute blood loss 10/30/2016  . Primary osteoarthritis of right knee 10/27/2016  . Dizziness   . Persistent atrial fibrillation (HCC)   . Cardiomyopathy (HCC) 01/25/2014  . Abnormal EKG 11/17/2011  . Atrial fibrillation (HCC) 09/10/2011  . Obstructive sleep apnea 02/05/2010  . OBESITY, MORBID 02/04/2010  . ANEMIA 02/04/2010  . Essential hypertension 02/04/2010  . BRADYCARDIA 02/04/2010   Past Medical History:  Diagnosis Date  . ANEMIA    PATIENT DENIES  . Arthritis   . Bradycardia   . Hypertension   . Obesity   . OSA (obstructive sleep apnea)    cpap   . Paroxysmal atrial fibrillation [facility])     Past Surgical History:  Procedure Laterality Date  . ABLATION   01/26/14   PVI by Dr Johney Frame  . APPENDECTOMY    . ATRIAL FIBRILLATION ABLATION [name] 01/26/2014   Procedure: ATRIAL FIBRILLATION ABLATION;  Surgeon: Gardiner Rhyme, MD;  Location: [facility] CATH LAB;  Service: Cardiovascular;  Laterality: [name];  . BILATERAL KNEE ARTHROSCOPY    . CARDIOVERSION  04/29/2012   Procedure: CARDIOVERSION;  Surgeon: Vesta Mixer, MD;  Location: [facility] ENDOSCOPY;  Service: Cardiovascular;  Laterality: [name];  . CARDIOVERSION [name] 12/21/2013   Procedure: CARDIOVERSION;  Surgeon: Wendall Stade, MD;  Location: [facility] ENDOSCOPY;  Service: Cardiovascular;  Laterality: [name];  . CARDIOVERSION [name] 05/30/2014   Procedure: CARDIOVERSION;  Surgeon: Laurey Morale, MD;  Location: [facility] ENDOSCOPY;  Service: Cardiovascular;  Laterality: [name];  . CARDIOVERSION [name] 04/26/2015   Procedure: CARDIOVERSION;  Surgeon: [name], MD;  Location: [facility] ENDOSCOPY;  Service: Cardiovascular;  Laterality: [name];  . CARDIOVERSION [name] 07/21/2017   Procedure: CARDIOVERSION;  Surgeon: [name], MD;  Location: [facility] ENDOSCOPY;  Service: Cardiovascular;  Laterality: [name];  . CATARACT EXTRACTION Right 2012  . EYE SURGERY    . L eye cataract     bilat  . TEE WITHOUT CARDIOVERSION  04/29/2012   Procedure: TRANSESOPHAGEAL ECHOCARDIOGRAM (TEE);  Surgeon: Vesta Mixer, MD;  Location: [facility] ENDOSCOPY;  Service: Cardiovascular;  Laterality: [name];  changed from nish to nahser/dl  . TEE WITHOUT CARDIOVERSION [name] 01/25/2014   Procedure: TRANSESOPHAGEAL ECHOCARDIOGRAM (TEE);  Surgeon: [name], MD;  Location: [facility] ENDOSCOPY;  Service: Cardiovascular;  Laterality: [name];  . TOTAL KNEE ARTHROPLASTY Right 10/27/2016   Procedure: TOTAL KNEE ARTHROPLASTY;  Surgeon: Jodi Geralds, MD;  Location:  [facility] OR;  Service: Orthopedics;  Laterality: Right;    Current Facility-Administered Medications  Medication Dose Route Frequency Provider Last Rate Last Admin  . [START ON 06/08/2020] bupivacaine liposome (EXPAREL) 1.3 % injection 266 mg  20 mL  Other Once Jodi Geralds, MD      . Melene Muller ON 06/08/2020] ceFAZolin (ANCEF) 3 g in dextrose 5 % 50 mL IVPB  3 g Intravenous On Call to OR Jodi Geralds, MD       Current Outpatient Medications  Medication Sig Dispense Refill Last Dose  . amiodarone (PACERONE) 200 MG tablet TAKE 1/2 TABLET BY MOUTH DAILY (Patient taking differently: Take 100 mg by mouth daily after lunch. ) 45 tablet 3   . amLODipine (NORVASC) 5 MG tablet TAKE 1 TABLET BY MOUTH EVERY DAY (Patient taking differently: Take 5 mg by mouth daily. ) 90 tablet 2   . ELDERBERRY PO Take 2 tablets by mouth daily.     . Menthol, Topical Analgesic, (BIOFREEZE EX) Apply 1 application topically as needed (for leg pain).      . metoprolol succinate (TOPROL-XL) 50 MG 24 hr tablet Take 1 tablet (50 mg total) by mouth daily. (Patient taking differently: Take 50 mg by mouth daily with lunch. ) 90 tablet 3   . Multiple Vitamin (MULTIVITAMIN WITH MINERALS) TABS tablet Take 3 tablets by mouth daily.     Carlena Hurl 20 MG TABS tablet TAKE 1 TABLET (20 MG TOTAL) BY MOUTH DAILY WITH SUPPER. (Patient taking differently: Take 20 mg by mouth every evening. ) 90 tablet 1    Allergies  Allergen Reactions  . Hydrocodone Itching and Other (See Comments)    Severe itching  . Other Itching and Other (See Comments)    All narcotics - Severe itching     Social History   Tobacco Use  . Smoking status: Never Smoker  . Smokeless tobacco: Never Used  Substance Use Topics  . Alcohol use: No    Family History  Problem Relation Age of Onset  . Diabetes Mother   . Hypertension Mother   . Hypertension Father      Review of Systems ROS: I have reviewed the patient's review of systems thoroughly and there are no positive responses as relates to the HPI. Objective:  Physical Exam  Vital signs in last 24 hours:   Well-developed well-nourished patient in no acute distress. Alert and oriented x3 HEENT:within normal limits Cardiac: Regular rate and  rhythm Pulmonary: Lungs clear to auscultation Abdomen: Soft and nontender.  Normal active bowel sounds  Musculoskeletal: (l knee: painful rom limited rom no instability 2+ effusion Labs:  Recent Results (from the past 2160 hour(s))  APTT     Status: None   Collection Time: 06/06/20  1:49 PM  Result Value Ref Range   aPTT 29 24 - 36 seconds    Comment: Performed at [facility], [address], [place], [place] [address]  Basic metabolic panel     Status: Abnormal   Collection Time: 06/06/20  1:49 PM  Result Value Ref Range   Sodium 137 135 - 145 mmol/L   Potassium 3.8 3.5 - 5.1 mmol/L   Chloride 101 98 - 111 mmol/L   CO2 26 22 - 32 mmol/L   Glucose, Bld 97 70 - 99 mg/dL    Comment: Glucose reference range applies only to samples taken after fasting for at least 8 hours.   BUN 27 (H) 6 - 20 mg/dL   Creatinine, Ser 7.90  0.61 - 1.24 mg/dL   Calcium 9.5 8.9 - 53.9 mg/dL   GFR calc non Af Amer >60 >60 mL/min   GFR calc Af Amer >60 >60 mL/min   Anion gap 10 5 - 15    Comment: Performed at [facility], [address], [place], [place] [address]  CBC WITH DIFFERENTIAL     Status: Abnormal   Collection Time: 06/06/20  1:49 PM  Result Value Ref Range   WBC 7.0 4.0 - 10.5 K/uL   RBC 3.84 (L) 4.22 - 5.81 MIL/uL   Hemoglobin 10.8 (L) 13.0 - 17.0 g/dL   HCT 19.3 (L) 39 - 52 %   MCV 86.7 80.0 - 100.0 fL   MCH 28.1 26.0 - 34.0 pg   MCHC 32.4 30.0 - 36.0 g/dL   RDW 79.0 24.0 - 97.3 %   Platelets 428 (H) 150 - 400 K/uL   nRBC 0.0 0.0 - 0.2 %   Neutrophils Relative % 61 %   Neutro Abs 4.2 1.7 - 7.7 K/uL   Lymphocytes Relative 29 %   Lymphs Abs 2.0 0.7 - 4.0 K/uL   Monocytes Relative 9 %   Monocytes Absolute 0.6 0 - 1 K/uL   Eosinophils Relative 1 %   Eosinophils Absolute 0.1 0 - 0 K/uL   Basophils Relative 0 %   Basophils Absolute 0.0 0 - 0 K/uL   Immature Granulocytes 0 %   Abs Immature Granulocytes 0.03 0.00 - 0.07 K/uL    Comment: Performed  at [facility], [address] [place], [place] [address]  Protime-INR     Status: None   Collection Time: 06/06/20  1:49 PM  Result Value Ref Range   Prothrombin Time 14.5 11.4 - 15.2 seconds   INR 1.2 0.8 - 1.2    Comment: (NOTE) INR goal varies based on device and disease states. Performed at [facility], [address] [place], [place] [address]   Type and screen Order type and screen if day of surgery is less than 15 days from draw of preadmission visit or order morning of surgery if day of surgery is greater than 6 days from preadmission visit.     Status: None   Collection Time: 06/06/20  1:49 PM  Result Value Ref Range   ABO/RH(D) A NEG    Antibody Screen NEG    Sample Expiration 06/20/2020,2359    Extend sample reason      NO TRANSFUSIONS OR PREGNANCY IN THE PAST 3 MONTHS Performed at [facility], [address], [place], [place] [address]   Urinalysis, Routine w reflex microscopic Urine, Clean Catch     Status: Abnormal   Collection Time: 06/06/20  1:49 PM  Result Value Ref Range   Color, Urine YELLOW YELLOW   APPearance HAZY (A) CLEAR   Specific Gravity, Urine 1.028 1.005 - 1.030   pH 5.0 5.0 - 8.0   Glucose, UA NEGATIVE NEGATIVE mg/dL   Hgb urine dipstick NEGATIVE NEGATIVE   Bilirubin Urine NEGATIVE NEGATIVE   Ketones, ur NEGATIVE NEGATIVE mg/dL   Protein, ur NEGATIVE NEGATIVE mg/dL   Nitrite NEGATIVE NEGATIVE   Leukocytes,Ua NEGATIVE NEGATIVE    Comment: Performed at [facility], [address], [place], [place] [address]  Surgical pcr screen     Status: None   Collection Time: 06/06/20  1:49 PM   Specimen: Nasal Mucosa; Nasal Swab  Result Value Ref Range   MRSA, PCR NEGATIVE NEGATIVE  Staphylococcus aureus NEGATIVE NEGATIVE    Comment: (NOTE) The Xpert SA Assay (FDA approved for NASAL specimens in patients 47 years of age and older), is one component of a  comprehensive surveillance program. It is not intended to diagnose infection nor to guide or monitor treatment. Performed at [facility], [address] [place], [place] [address]   SARS CORONAVIRUS 2 (TAT 6-24 HRS) Nasopharyngeal Nasopharyngeal Swab     Status: None   Collection Time: 06/06/20  3:16 PM   Specimen: Nasopharyngeal Swab  Result Value Ref Range   SARS Coronavirus 2 NEGATIVE NEGATIVE    Comment: (NOTE) SARS-CoV-2 target nucleic acids are NOT DETECTED.  The SARS-CoV-2 RNA is generally detectable in upper and lower respiratory specimens during the acute phase of infection. Negative results do not preclude SARS-CoV-2 infection, do not rule out co-infections with other pathogens, and should not be used as the sole basis for treatment or other patient management decisions. Negative results must be combined with clinical observations, patient history, and epidemiological information. The expected result is Negative.  Fact Sheet for Patients: HairSlick.no  Fact Sheet for Healthcare Providers: quierodirigir.com  This test is not yet approved or cleared by the [place] FDA and  has been authorized for detection and/or diagnosis of SARS-CoV-2 by FDA under an Emergency Use Authorization (EUA). This EUA will remain  in effect (meaning this test can be used) for the duration of the COVID-19 declaration under Se ction 564(b)(1) of the Act, 21 U.S.C. section 360bbb-3(b)(1), unless the authorization is terminated or revoked sooner.  Performed at [facility], [address], [place], [place] [address]    Estimated body mass index is 35.79 kg/m as calculated from the following:   Height as of 06/06/20: 6\' 4"  (1.93 m).   Weight as of 06/06/20: 133.4 kg.   Imaging Review Plain radiographs demonstrate severe degenerative joint disease of the left knee(s). The overall alignment ismild varus. The  bone quality appears to be fair for age and reported activity level.      Assessment/Plan:  End stage arthritis, left knee   The patient history, physical examination, clinical judgment of the provider and imaging studies are consistent with end stage degenerative joint disease of the left knee(s) and total knee arthroplasty is deemed medically necessary. The treatment options including medical management, injection therapy arthroscopy and arthroplasty were discussed at length. The risks and benefits of total knee arthroplasty were presented and reviewed. The risks due to aseptic loosening, infection, stiffness, patella tracking problems, thromboembolic complications and other imponderables were discussed. The patient acknowledged the explanation, agreed to proceed with the plan and consent was signed. Patient is being admitted for inpatient treatment for surgery, pain control, PT, OT, prophylactic antibiotics, VTE prophylaxis, progressive ambulation and ADL's and discharge planning. The patient is planning to be discharged home with home health services     Patient's anticipated LOS is less than 2 midnights, meeting these requirements: - Younger than 14 - Lives within 1 hour of care - Has a competent adult at home to recover with post-op recover - NO history of  - Chronic pain requiring opiods  - Diabetes  - Coronary Artery Disease  - Heart failure  - Heart attack  - Stroke  - DVT/VTE  - Cardiac arrhythmia  - Respiratory Failure/COPD  - Renal failure  - Anemia  - Advanced Liver disease

## 2020-06-08 ENCOUNTER — Ambulatory Visit (HOSPITAL_COMMUNITY): Payer: Medicare Other | Admitting: Physician Assistant

## 2020-06-08 ENCOUNTER — Encounter (HOSPITAL_COMMUNITY)
Admission: RE | Disposition: A | Discharge status: Home / Self Care | Payer: Self-pay | Source: Home / Self Care | Attending: Orthopedic Surgery

## 2020-06-08 ENCOUNTER — Other Ambulatory Visit: Payer: Self-pay

## 2020-06-08 ENCOUNTER — Encounter (HOSPITAL_COMMUNITY): Payer: Self-pay | Admitting: Orthopedic Surgery

## 2020-06-08 ENCOUNTER — Ambulatory Visit (HOSPITAL_COMMUNITY): Payer: Medicare Other | Admitting: Registered Nurse

## 2020-06-08 ENCOUNTER — Observation Stay (HOSPITAL_COMMUNITY)
Admission: RE | Admit: 2020-06-08 | Discharge: 2020-06-09 | Disposition: A | Discharge status: Home / Self Care | Payer: Medicare Other | Attending: Orthopedic Surgery | Admitting: Orthopedic Surgery

## 2020-06-08 DIAGNOSIS — Z79899 Other long term (current) drug therapy: Secondary | ICD-10-CM | POA: Diagnosis not present

## 2020-06-08 DIAGNOSIS — M1712 Unilateral primary osteoarthritis, left knee: Principal | ICD-10-CM

## 2020-06-08 DIAGNOSIS — I480 Paroxysmal atrial fibrillation: Secondary | ICD-10-CM | POA: Insufficient documentation

## 2020-06-08 DIAGNOSIS — G4733 Obstructive sleep apnea (adult) (pediatric): Secondary | ICD-10-CM | POA: Insufficient documentation

## 2020-06-08 DIAGNOSIS — Z6837 Body mass index (BMI) 37.0-37.9, adult: Secondary | ICD-10-CM | POA: Diagnosis not present

## 2020-06-08 DIAGNOSIS — Z96652 Presence of left artificial knee joint: Secondary | ICD-10-CM

## 2020-06-08 DIAGNOSIS — M25562 Pain in left knee: Secondary | ICD-10-CM | POA: Diagnosis present

## 2020-06-08 DIAGNOSIS — I10 Essential (primary) hypertension: Secondary | ICD-10-CM | POA: Insufficient documentation

## 2020-06-08 HISTORY — PX: TOTAL KNEE ARTHROPLASTY: SHX125

## 2020-06-08 LAB — TYPE AND SCREEN
ABO/RH(D): A NEG
Antibody Screen: NEGATIVE

## 2020-06-08 SURGERY — ARTHROPLASTY, KNEE, TOTAL
Anesthesia: Monitor Anesthesia Care | Site: Knee | Laterality: Left

## 2020-06-08 MED ORDER — WATER FOR IRRIGATION, STERILE IR SOLN
Status: DC | PRN
  Administered 2020-06-08: 2000 mL

## 2020-06-08 MED ORDER — FENTANYL CITRATE (PF) 100 MCG/2ML IJ SOLN
INTRAMUSCULAR | Status: DC | PRN
Start: 2020-06-08 — End: 2020-06-08
  Administered 2020-06-08: 100 ug via INTRAVENOUS

## 2020-06-08 MED ORDER — METOPROLOL SUCCINATE ER 50 MG PO TB24
50.0000 mg | ORAL_TABLET | Freq: Every day | ORAL | Status: DC
  Administered 2020-06-08 – 2020-06-09 (×2): 50 mg via ORAL
  Filled 2020-06-08 (×2): qty 1

## 2020-06-08 MED ORDER — POLYETHYLENE GLYCOL 3350 17 G PO PACK: 17.0000 g | PACK | Freq: Every day | ORAL | Status: DC | PRN

## 2020-06-08 MED ORDER — LIDOCAINE 2% (20 MG/ML) 5 ML SYRINGE
INTRAMUSCULAR | Status: DC | PRN
  Administered 2020-06-08: 40 mg via INTRAVENOUS

## 2020-06-08 MED ORDER — DEXAMETHASONE SODIUM PHOSPHATE 10 MG/ML IJ SOLN
10.0000 mg | Freq: Two times a day (BID) | INTRAMUSCULAR | Status: DC
  Administered 2020-06-09: 10 mg via INTRAVENOUS
  Filled 2020-06-08: qty 1

## 2020-06-08 MED ORDER — BUPIVACAINE LIPOSOME 1.3 % IJ SUSP
INTRAMUSCULAR | Status: DC | PRN
  Administered 2020-06-08: 20 mL

## 2020-06-08 MED ORDER — BUPIVACAINE-EPINEPHRINE (PF) 0.25% -1:200000 IJ SOLN
INTRAMUSCULAR | Status: AC
  Filled 2020-06-08: qty 30

## 2020-06-08 MED ORDER — LIDOCAINE 2% (20 MG/ML) 5 ML SYRINGE
INTRAMUSCULAR | Status: AC
  Filled 2020-06-08: qty 5

## 2020-06-08 MED ORDER — CELECOXIB 200 MG PO CAPS
200.0000 mg | ORAL_CAPSULE | Freq: Two times a day (BID) | ORAL | Status: DC
  Administered 2020-06-08 – 2020-06-09 (×2): 200 mg via ORAL
  Filled 2020-06-08 (×2): qty 1

## 2020-06-08 MED ORDER — ROPIVACAINE HCL 7.5 MG/ML IJ SOLN
INTRAMUSCULAR | Status: DC | PRN
  Administered 2020-06-08: 20 mL via PERINEURAL

## 2020-06-08 MED ORDER — PROPOFOL 1000 MG/100ML IV EMUL
INTRAVENOUS | Status: AC
  Filled 2020-06-08: qty 100

## 2020-06-08 MED ORDER — CEFAZOLIN SODIUM-DEXTROSE 2-4 GM/100ML-% IV SOLN
2.0000 g | Freq: Four times a day (QID) | INTRAVENOUS | Status: AC
  Administered 2020-06-08 (×2): 2 g via INTRAVENOUS
  Filled 2020-06-08 (×2): qty 100

## 2020-06-08 MED ORDER — METOCLOPRAMIDE HCL 5 MG/ML IJ SOLN: 5.0000 mg | Freq: Three times a day (TID) | INTRAMUSCULAR | Status: DC | PRN

## 2020-06-08 MED ORDER — BISACODYL 5 MG PO TBEC: 5.0000 mg | DELAYED_RELEASE_TABLET | Freq: Every day | ORAL | Status: DC | PRN

## 2020-06-08 MED ORDER — 0.9 % SODIUM CHLORIDE (POUR BTL) OPTIME
TOPICAL | Status: DC | PRN
  Administered 2020-06-08: 1000 mL

## 2020-06-08 MED ORDER — METOCLOPRAMIDE HCL 5 MG PO TABS: 5.0000 mg | ORAL_TABLET | Freq: Three times a day (TID) | ORAL | Status: DC | PRN

## 2020-06-08 MED ORDER — MIDAZOLAM HCL 2 MG/2ML IJ SOLN
INTRAMUSCULAR | Status: AC
  Filled 2020-06-08: qty 2

## 2020-06-08 MED ORDER — FENTANYL CITRATE (PF) 100 MCG/2ML IJ SOLN
INTRAMUSCULAR | Status: AC
  Filled 2020-06-08: qty 2

## 2020-06-08 MED ORDER — POVIDONE-IODINE 10 % EX SWAB
2.0000 "application " | Freq: Once | CUTANEOUS | Status: AC
  Administered 2020-06-08: 2 via TOPICAL

## 2020-06-08 MED ORDER — SODIUM CHLORIDE 0.9 % IR SOLN
Status: DC | PRN
  Administered 2020-06-08: 1000 mL

## 2020-06-08 MED ORDER — DEXAMETHASONE SODIUM PHOSPHATE 10 MG/ML IJ SOLN
INTRAMUSCULAR | Status: DC | PRN
  Administered 2020-06-08: 10 mg via INTRAVENOUS

## 2020-06-08 MED ORDER — FENTANYL CITRATE (PF) 100 MCG/2ML IJ SOLN
25.0000 ug | INTRAMUSCULAR | Status: DC | PRN
  Administered 2020-06-08 (×3): 50 ug via INTRAVENOUS

## 2020-06-08 MED ORDER — ALUM & MAG HYDROXIDE-SIMETH 200-200-20 MG/5ML PO SUSP: 30.0000 mL | ORAL | Status: DC | PRN

## 2020-06-08 MED ORDER — ACETAMINOPHEN 325 MG PO TABS
325.0000 mg | ORAL_TABLET | Freq: Four times a day (QID) | ORAL | Status: DC | PRN
  Administered 2020-06-09: 650 mg via ORAL
  Filled 2020-06-08: qty 2

## 2020-06-08 MED ORDER — DOCUSATE SODIUM 100 MG PO CAPS
100.0000 mg | ORAL_CAPSULE | Freq: Two times a day (BID) | ORAL | Status: DC
  Administered 2020-06-08 – 2020-06-09 (×2): 100 mg via ORAL
  Filled 2020-06-08 (×2): qty 1

## 2020-06-08 MED ORDER — TRANEXAMIC ACID-NACL 1000-0.7 MG/100ML-% IV SOLN: 1000.0000 mg | Freq: Once | INTRAVENOUS | Status: DC

## 2020-06-08 MED ORDER — ONDANSETRON HCL 4 MG/2ML IJ SOLN
INTRAMUSCULAR | Status: AC
  Filled 2020-06-08: qty 2

## 2020-06-08 MED ORDER — SODIUM CHLORIDE 0.9% FLUSH
INTRAVENOUS | Status: DC | PRN
  Administered 2020-06-08: 50 mL

## 2020-06-08 MED ORDER — OXYCODONE HCL 5 MG PO TABS: 5.0000 mg | ORAL_TABLET | Freq: Once | ORAL | Status: DC | PRN

## 2020-06-08 MED ORDER — ACETAMINOPHEN 500 MG PO TABS: 1000.0000 mg | ORAL_TABLET | Freq: Once | ORAL | Status: DC | PRN

## 2020-06-08 MED ORDER — ACETAMINOPHEN 160 MG/5ML PO SOLN: 1000.0000 mg | Freq: Once | ORAL | Status: DC | PRN

## 2020-06-08 MED ORDER — MAGNESIUM CITRATE PO SOLN: 1.0000 | Freq: Once | ORAL | Status: DC | PRN

## 2020-06-08 MED ORDER — OXYCODONE HCL 5 MG PO TABS
5.0000 mg | ORAL_TABLET | ORAL | Status: DC | PRN
  Administered 2020-06-08: 5 mg via ORAL
  Administered 2020-06-08: 10 mg via ORAL
  Administered 2020-06-08: 5 mg via ORAL
  Administered 2020-06-09 (×2): 10 mg via ORAL
  Filled 2020-06-08 (×4): qty 2

## 2020-06-08 MED ORDER — BUPIVACAINE-EPINEPHRINE (PF) 0.25% -1:200000 IJ SOLN
INTRAMUSCULAR | Status: DC | PRN
  Administered 2020-06-08: 30 mL

## 2020-06-08 MED ORDER — METHOCARBAMOL 500 MG IVPB - SIMPLE MED
500.0000 mg | Freq: Four times a day (QID) | INTRAVENOUS | Status: DC | PRN
  Administered 2020-06-08: 500 mg via INTRAVENOUS
  Filled 2020-06-08: qty 50

## 2020-06-08 MED ORDER — HYDROMORPHONE HCL 1 MG/ML IJ SOLN: 0.5000 mg | INTRAMUSCULAR | Status: DC | PRN

## 2020-06-08 MED ORDER — ASPIRIN EC 325 MG PO TBEC
325.0000 mg | DELAYED_RELEASE_TABLET | Freq: Two times a day (BID) | ORAL | Status: DC
  Filled 2020-06-08: qty 1

## 2020-06-08 MED ORDER — TRANEXAMIC ACID-NACL 1000-0.7 MG/100ML-% IV SOLN
1000.0000 mg | INTRAVENOUS | Status: AC
  Administered 2020-06-08: 1000 mg via INTRAVENOUS
  Filled 2020-06-08: qty 100

## 2020-06-08 MED ORDER — DEXAMETHASONE SODIUM PHOSPHATE 10 MG/ML IJ SOLN
INTRAMUSCULAR | Status: AC
  Filled 2020-06-08: qty 1

## 2020-06-08 MED ORDER — PROPOFOL 10 MG/ML IV BOLUS
INTRAVENOUS | Status: DC | PRN
  Administered 2020-06-08: 20 mg via INTRAVENOUS

## 2020-06-08 MED ORDER — METHOCARBAMOL 500 MG PO TABS
500.0000 mg | ORAL_TABLET | Freq: Four times a day (QID) | ORAL | Status: DC | PRN
  Administered 2020-06-08 – 2020-06-09 (×2): 500 mg via ORAL
  Filled 2020-06-08 (×2): qty 1

## 2020-06-08 MED ORDER — RIVAROXABAN 10 MG PO TABS
10.0000 mg | ORAL_TABLET | Freq: Every day | ORAL | Status: DC
  Administered 2020-06-09: 10 mg via ORAL
  Filled 2020-06-08: qty 1

## 2020-06-08 MED ORDER — METHOCARBAMOL 500 MG IVPB - SIMPLE MED
INTRAVENOUS | Status: AC
  Filled 2020-06-08: qty 50

## 2020-06-08 MED ORDER — SODIUM CHLORIDE (PF) 0.9 % IJ SOLN
INTRAMUSCULAR | Status: AC
  Filled 2020-06-08: qty 50

## 2020-06-08 MED ORDER — SODIUM CHLORIDE 0.9 % IR SOLN
Status: DC | PRN
  Administered 2020-06-08: 3000 mL

## 2020-06-08 MED ORDER — GABAPENTIN 300 MG PO CAPS
300.0000 mg | ORAL_CAPSULE | Freq: Two times a day (BID) | ORAL | Status: DC
  Administered 2020-06-08 – 2020-06-09 (×2): 300 mg via ORAL
  Filled 2020-06-08 (×2): qty 1

## 2020-06-08 MED ORDER — ONDANSETRON HCL 4 MG/2ML IJ SOLN
INTRAMUSCULAR | Status: DC | PRN
  Administered 2020-06-08: 4 mg via INTRAVENOUS

## 2020-06-08 MED ORDER — ONDANSETRON HCL 4 MG/2ML IJ SOLN: 4.0000 mg | Freq: Four times a day (QID) | INTRAMUSCULAR | Status: DC | PRN

## 2020-06-08 MED ORDER — ACETAMINOPHEN 10 MG/ML IV SOLN: 1000.0000 mg | Freq: Once | INTRAVENOUS | Status: DC | PRN

## 2020-06-08 MED ORDER — PROPOFOL 500 MG/50ML IV EMUL
INTRAVENOUS | Status: DC | PRN
  Administered 2020-06-08: 50 ug/kg/min via INTRAVENOUS

## 2020-06-08 MED ORDER — FENTANYL CITRATE (PF) 100 MCG/2ML IJ SOLN
INTRAMUSCULAR | Status: AC
Start: 2020-06-08 — End: 2020-06-08
  Filled 2020-06-08: qty 2

## 2020-06-08 MED ORDER — OXYCODONE HCL 5 MG/5ML PO SOLN: 5.0000 mg | Freq: Once | ORAL | Status: DC | PRN

## 2020-06-08 MED ORDER — BUPIVACAINE IN DEXTROSE 0.75-8.25 % IT SOLN
INTRATHECAL | Status: DC | PRN
  Administered 2020-06-08: 2 mL via INTRATHECAL

## 2020-06-08 MED ORDER — DIPHENHYDRAMINE HCL 12.5 MG/5ML PO ELIX: 12.5000 mg | ORAL_SOLUTION | ORAL | Status: DC | PRN

## 2020-06-08 MED ORDER — CHLORHEXIDINE GLUCONATE 0.12 % MT SOLN
15.0000 mL | Freq: Once | OROMUCOSAL | Status: AC
  Administered 2020-06-08: 15 mL via OROMUCOSAL

## 2020-06-08 MED ORDER — ORAL CARE MOUTH RINSE: 15.0000 mL | Freq: Once | OROMUCOSAL | Status: AC

## 2020-06-08 MED ORDER — SODIUM CHLORIDE 0.9 % IV SOLN: INTRAVENOUS | Status: DC

## 2020-06-08 MED ORDER — CHLORHEXIDINE GLUCONATE CLOTH 2 % EX PADS: 6.0000 | MEDICATED_PAD | Freq: Every day | CUTANEOUS | Status: DC

## 2020-06-08 MED ORDER — AMIODARONE HCL 200 MG PO TABS
200.0000 mg | ORAL_TABLET | Freq: Every day | ORAL | Status: DC
  Administered 2020-06-08 – 2020-06-09 (×2): 200 mg via ORAL
  Filled 2020-06-08 (×2): qty 1

## 2020-06-08 MED ORDER — MIDAZOLAM HCL 5 MG/5ML IJ SOLN
INTRAMUSCULAR | Status: DC | PRN
  Administered 2020-06-08: 2 mg via INTRAVENOUS

## 2020-06-08 MED ORDER — PROPOFOL 10 MG/ML IV BOLUS
INTRAVENOUS | Status: AC
  Filled 2020-06-08: qty 20

## 2020-06-08 MED ORDER — LACTATED RINGERS IV SOLN: INTRAVENOUS | Status: DC

## 2020-06-08 MED ORDER — ONDANSETRON HCL 4 MG PO TABS: 4.0000 mg | ORAL_TABLET | Freq: Four times a day (QID) | ORAL | Status: DC | PRN

## 2020-06-08 SURGICAL SUPPLY — 52 items
ATTUNE MED DOME PAT 41 KNEE (Knees) ×2 IMPLANT
ATTUNE PS FEM LT SZ 8 CEM KNEE (Femur) ×2 IMPLANT
ATTUNE PSRP INSR SZ8 12 KNEE (Insert) ×2 IMPLANT
BAG ZIPLOCK 12X15 (MISCELLANEOUS) ×2 IMPLANT
BASE TIBIAL ATTUNE KNEE SZ9 (Knees) ×1 IMPLANT
BENZOIN TINCTURE PRP APPL 2/3 (GAUZE/BANDAGES/DRESSINGS) ×2 IMPLANT
BLADE SAGITTAL 25.0X1.19X90 (BLADE) ×2 IMPLANT
BLADE SAW SGTL 13.0X1.19X90.0M (BLADE) ×2 IMPLANT
BLADE SURG SZ10 CARB STEEL (BLADE) ×4 IMPLANT
BNDG ELASTIC 6X5.8 VLCR STR LF (GAUZE/BANDAGES/DRESSINGS) ×2 IMPLANT
BOOTIES KNEE HIGH SLOAN (MISCELLANEOUS) ×2 IMPLANT
BOWL SMART MIX CTS (DISPOSABLE) ×2 IMPLANT
CEMENT HV SMART SET (Cement) ×4 IMPLANT
COVER SURGICAL LIGHT HANDLE (MISCELLANEOUS) ×2 IMPLANT
COVER WAND RF STERILE (DRAPES) IMPLANT
CUFF TOURN SGL QUICK 34 (TOURNIQUET CUFF) ×1
CUFF TRNQT CYL 34X4.125X (TOURNIQUET CUFF) ×1 IMPLANT
DECANTER SPIKE VIAL GLASS SM (MISCELLANEOUS) ×4 IMPLANT
DRAPE U-SHAPE 47X51 STRL (DRAPES) ×2 IMPLANT
DRSG AQUACEL AG ADV 3.5X10 (GAUZE/BANDAGES/DRESSINGS) ×2 IMPLANT
DURAPREP 26ML APPLICATOR (WOUND CARE) ×2 IMPLANT
ELECT REM PT RETURN 15FT ADLT (MISCELLANEOUS) ×2 IMPLANT
GLOVE BIOGEL PI IND STRL 8 (GLOVE) ×2 IMPLANT
GLOVE BIOGEL PI INDICATOR 8 (GLOVE) ×2
GLOVE ECLIPSE 7.5 STRL STRAW (GLOVE) ×4 IMPLANT
GOWN STRL REUS W/TWL XL LVL3 (GOWN DISPOSABLE) ×4 IMPLANT
HANDPIECE INTERPULSE COAX TIP (DISPOSABLE) ×1
HOLDER FOLEY CATH W/STRAP (MISCELLANEOUS) ×2 IMPLANT
HOOD PEEL AWAY FLYTE STAYCOOL (MISCELLANEOUS) ×6 IMPLANT
KIT TURNOVER KIT A (KITS) IMPLANT
MANIFOLD NEPTUNE II (INSTRUMENTS) ×2 IMPLANT
NEEDLE HYPO 22GX1.5 SAFETY (NEEDLE) ×2 IMPLANT
NS IRRIG 1000ML POUR BTL (IV SOLUTION) ×2 IMPLANT
PACK ICE MAXI GEL EZY WRAP (MISCELLANEOUS) ×2 IMPLANT
PACK TOTAL KNEE CUSTOM (KITS) ×2 IMPLANT
PADDING CAST COTTON 6X4 STRL (CAST SUPPLIES) ×2 IMPLANT
PENCIL SMOKE EVACUATOR (MISCELLANEOUS) IMPLANT
PIN DRILL FIX HALF THREAD (BIT) ×2 IMPLANT
PIN STEINMAN FIXATION KNEE (PIN) ×2 IMPLANT
PROTECTOR NERVE ULNAR (MISCELLANEOUS) ×2 IMPLANT
SET HNDPC FAN SPRY TIP SCT (DISPOSABLE) ×1 IMPLANT
STRIP CLOSURE SKIN 1/2X4 (GAUZE/BANDAGES/DRESSINGS) ×2 IMPLANT
SUT MNCRL AB 3-0 PS2 18 (SUTURE) ×2 IMPLANT
SUT VIC AB 0 CT1 36 (SUTURE) ×2 IMPLANT
SUT VIC AB 1 CT1 36 (SUTURE) ×4 IMPLANT
SUT VIC AB 2-0 CT1 27 (SUTURE) ×1
SUT VIC AB 2-0 CT1 TAPERPNT 27 (SUTURE) ×1 IMPLANT
SYR CONTROL 10ML LL (SYRINGE) ×4 IMPLANT
TIBIAL BASE ATTUNE KNEE SZ9 (Knees) ×2 IMPLANT
TRAY FOLEY MTR SLVR 16FR STAT (SET/KITS/TRAYS/PACK) ×2 IMPLANT
WATER STERILE IRR 1000ML POUR (IV SOLUTION) ×4 IMPLANT
WRAP KNEE MAXI GEL POST OP (GAUZE/BANDAGES/DRESSINGS) ×2 IMPLANT

## 2020-06-08 NOTE — Op Note (Signed)
PATIENT ID:      Maurice Golden  MRN:     [id number] DOB/AGE:    September 05, 1961 / 59 y.o.       OPERATIVE REPORT   DATE OF PROCEDURE:  06/08/2020      PREOPERATIVE DIAGNOSIS:   LEFT KNEE DEGENERATIVE JOINT DISEASE      Estimated body mass index is 35.79 kg/m as calculated from the following:   Height as of 06/06/20: 6\' 4"  (1.93 m).   Weight as of 06/06/20: 133.4 kg.                                                       POSTOPERATIVE DIAGNOSIS:   Same                                                                  PROCEDURE:  Procedure(s): TOTAL KNEE ARTHROPLASTY Using DepuyAttune RP implants #8 Femur, #9Tibia, 12 mm Attune RP bearing, 41 Patella    SURGEON: 06/08/20  ASSISTANT: Harvie Junior PA-C   (Present and scrubbed throughout the case, critical for assistance with exposure, retraction, instrumentation, and closure.)        ANESTHESIA: spinal, 20cc Exparel, 50cc 0.25% Marcaine EBL: min cc FLUID REPLACEMENT: unk cc crystaloid TOURNIQUET: DRAINS: None TRANEXAMIC ACID: 1gm IV, 2gm topical COMPLICATIONS:  None         INDICATIONS FOR PROCEDURE: The patient has  LEFT KNEE DEGENERATIVE JOINT DISEASE, varus deformities, XR shows bone on bone arthritis, lateral subluxation of tibia. Patient has failed all conservative measures including anti-inflammatory medicines, narcotics, attempts at exercise and weight loss, cortisone injections and viscosupplementation.  Risks and benefits of surgery have been discussed, questions answered.   DESCRIPTION OF PROCEDURE: The patient identified by armband, received  IV antibiotics, in the holding area at [facility]. Patient taken to the operating room, appropriate anesthetic monitors were attached, and spinal anesthesia was  induced. IV Tranexamic acid was given.Tourniquet applied high to the operative thigh. Lateral post and foot positioner applied to the table, the lower extremity was then prepped and draped in usual sterile fashion from the toes  to the tourniquet. Time-out procedure was performed.[facility], was present and scrubbed throughout the case, critical for assistance with, positioning, exposure, retraction, instrumentation, and closure.The skin and subcutaneous tissue along the incision was injected with 20 cc of a mixture of Exparel and Marcaine solution, using a 20-gauge by 1-1/2 inch needle. We began the operation, with the knee flexed 130 degrees, by making the anterior midline incision starting at handbreadth above the patella going over the patella 1 cm medial to and 4 cm distal to the tibial tubercle. Small bleeders in the skin and the subcutaneous tissue identified and cauterized. Transverse retinaculum was incised and reflected medially and a medial parapatellar arthrotomy was accomplished. the patella was everted and theprepatellar fat pad resected. The superficial medial collateral ligament was then elevated from anterior to posterior along the proximal flare of the tibia and anterior half of the menisci resected. The knee was hyperflexed exposing bone on bone arthritis. Peripheral and notch osteophytes as well  as the cruciate ligaments were then resected. We continued to work our way around posteriorly along the proximal tibia, and externally rotated the tibia subluxing it out from underneath the femur. A McHale PCL retractor was placed through the notch and a lateral Hohmann retractor placed, and we then entered the proximal tibia in line with the Depuy starter drill in line with the axis of the tibia followed by an intramedullary guide rod and 0-degree posterior slope cutting guide. The tibial cutting guide, 4 degree posterior sloped, was pinned into place allowing resection of 2 mm of bone medially and 10 mm of bone laterally. Satisfied with the tibial resection, we then entered the distal femur 2 mm anterior to the PCL origin with the intramedullary guide rod and applied the distal femoral cutting guide set at 9 mm, with 5  degrees of valgus. This was pinned along the epicondylar axis. At this point, the distal femoral cut was accomplished without difficulty. We then sized for a #8 femoral component and pinned the guide in 3 degrees of external rotation. The chamfer cutting guide was pinned into place. The anterior, posterior, and chamfer cuts were accomplished without difficulty followed by the Attune RP box cutting guide and the box cut. We also removed posterior osteophytes from the posterior femoral condyles. The posterior capsule was injected with Exparel solution. The knee was brought into full extension. We checked our extension gap and fit a 12 mm bearing. Distracting in extension with a lamina spreader,  bleeders in the posterior capsule, Posterior medial and posterior lateral gutter were cauterized.  The transexamic acid-soaked sponge was then placed in the gap of the knee in extension. The knee was flexed 30. The posterior patella cut was accomplished with the 9.5 mm Attune cutting guide, sized for a 60mm dome, and the fixation pegs drilled.The knee was then once again hyperflexed exposing the proximal tibia. We sized for a # 9 tibial base plate, applied the smokestack and the conical reamer followed by the the Delta fin keel punch. We then hammered into place the Attune RP trial femoral component, drilled the lugs, inserted a  12 mm trial bearing, trial patellar button, and took the knee through range of motion from 0-130 degrees. Medial and lateral ligamentous stability was checked. No thumb pressure was required for patellar Tracking. The tourniquet was released at 50 min. All trial components were removed, mating surfaces irrigated with pulse lavage, and dried with suction and sponges. 10 cc of the Exparel solution was applied to the cancellus bone of the patella distal femur and proximal tibia.  After waiting 30 seconds, the bony surfaces were again, dried with sponges. A double batch of DePuy HV cement was mixed and  applied to all bony metallic mating surfaces except for the posterior condyles of the femur itself. In order, we hammered into place the tibial tray and removed excess cement, the femoral component and removed excess cement. The final Attune RP bearing was inserted, and the knee brought to full extension with compression. The patellar button was clamped into place, and excess cement removed. The knee was held at 30 flexion with compression, while the cement cured. The wound was irrigated out with normal saline solution pulse lavage. The rest of the Exparel was injected into the parapatellar arthrotomy, subcutaneous tissues, and periosteal tissues. The parapatellar arthrotomy was closed with running #1 Vicryl suture. The subcutaneous tissue with 3-0 undyed Vicryl suture, and the skin with running 3-0 SQ vicryl. An Aquacil and Ace wrap were  applied. The patient was taken to recovery room without difficulty.   Harvie Junior 06/08/2020, 9:04 AM

## 2020-06-08 NOTE — Plan of Care (Signed)

## 2020-06-08 NOTE — Interval H&P Note (Signed)
History and Physical Interval Note:  06/08/2020 7:18 AM  Maurice Golden  has presented today for surgery, with the diagnosis of LEFT KNEE DEGENERATIVE JOINT DISEASE.  The various methods of treatment have been discussed with the patient and family. After consideration of risks, benefits and other options for treatment, the patient has consented to  Procedure(s): TOTAL KNEE ARTHROPLASTY (Left) as a surgical intervention.  The patient's history has been reviewed, patient examined, no change in status, stable for surgery.  I have reviewed the patient's chart and labs.  Questions were answered to the patient's satisfaction.     Harvie Junior

## 2020-06-08 NOTE — Transfer of Care (Signed)
Immediate Anesthesia Transfer of Care Note  Patient: Maurice Golden  Procedure(s) Performed: TOTAL KNEE ARTHROPLASTY (Left Knee)  Patient Location: PACU  Anesthesia Type:Spinal  Level of Consciousness: alert   Airway & Oxygen Therapy: Patient Spontanous Breathing and Patient connected to face mask oxygen  Post-op Assessment: Report given to RN and Post -op Vital signs reviewed and stable  Post vital signs: Reviewed and stable  Last Vitals:  Vitals Value Taken Time  BP    Temp    Pulse 72 06/08/20 0944  Resp    SpO2 99 % 06/08/20 0944  Vitals shown include unvalidated device data.  Last Pain:  Vitals:   06/08/20 0627  TempSrc: Oral         Complications: No complications documented.

## 2020-06-08 NOTE — Interval H&P Note (Signed)
History and Physical Interval Note:  06/08/2020 7:17 AM  Maurice Golden  has presented today for surgery, with the diagnosis of LEFT KNEE DEGENERATIVE JOINT DISEASE.  The various methods of treatment have been discussed with the patient and family. After consideration of risks, benefits and other options for treatment, the patient has consented to  Procedure(s): TOTAL KNEE ARTHROPLASTY (Left) as a surgical intervention.  The patient's history has been reviewed, patient examined, no change in status, stable for surgery.  I have reviewed the patient's chart and labs.  Questions were answered to the patient's satisfaction.     Harvie Junior

## 2020-06-08 NOTE — Anesthesia Procedure Notes (Signed)
Date/Time: 06/08/2020 7:28 AM Performed by: Jhonnie Garner, CRNA Oxygen Delivery Method: Simple face mask

## 2020-06-08 NOTE — Evaluation (Signed)
Physical Therapy Evaluation Patient Details Name: Maurice Golden MRN: [id number] DOB: May 07, 1961 Today's Date: 06/08/2020   History of Present Illness  Pt is 59 yo male s/p L TKA on 06/08/20.  Pt w PMH including R TKA, morbid obesity, and afib.  Clinical Impression  Pt is s/p L TKA resulting in the deficits listed below (see PT Problem List). Pt was able to transfer with min A and ambulated 12' with RW.  Required min cues for technique.  Pt has had difficulty with knee extension for the past several weeks and demonstrates tight hamstrings limiting extension - will need significant focus on this for recovery.  Pt reports will have intermittent support at home as he wants to do what he can on his own - encouraged to have increased support initially. Pt will benefit from skilled PT to increase their independence and safety with mobility to allow discharge to the venue listed below.      Follow Up Recommendations Follow surgeon's recommendation for DC plan and follow-up therapies;Supervision for mobility/OOB    Equipment Recommendations  Other (comment) (wider BSC)    Recommendations for Other Services       Precautions / Restrictions Precautions Precautions: Fall Restrictions Weight Bearing Restrictions: Yes LLE Weight Bearing: Weight bearing as tolerated      Mobility  Bed Mobility Overal bed mobility: Needs Assistance Bed Mobility: Supine to Sit;Sit to Supine     Supine to sit: Min assist Sit to supine: Min assist   General bed mobility comments: min A for L LE  Transfers Overall transfer level: Needs assistance Equipment used: Rolling walker (2 wheeled) Transfers: Sit to/from Stand Sit to Stand: From elevated surface;Min assist         General transfer comment: cues for safe hand placement and to come up on R LE  Ambulation/Gait Ambulation/Gait assistance: Min guard Gait Distance (Feet): 12 Feet Assistive device: Rolling walker (2 wheeled) Gait Pattern/deviations:  Step-to pattern;Decreased stride length;Decreased weight shift to left;Decreased dorsiflexion - left Gait velocity: decreased   General Gait Details: Ambulated on L toe due to Hamstring tightness and difficulty straightening knee ;  Pt gradually worked on getting heel flat on L with good improvement but still had difficulty straightening knee  Stairs            Wheelchair Mobility    Modified Rankin (Stroke Patients Only)       Balance Overall balance assessment: Needs assistance Sitting-balance support: No upper extremity supported Sitting balance-Leahy Scale: Good     Standing balance support: Bilateral upper extremity supported Standing balance-Leahy Scale: Poor Standing balance comment: required RW                             Pertinent Vitals/Pain Pain Assessment: 0-10 Pain Score: 6  Pain Location: L knee Pain Descriptors / Indicators: Discomfort;Sore Pain Intervention(s): Limited activity within patient's tolerance;Monitored during session;Premedicated before session;Repositioned;Ice applied    Home Living Family/patient expects to be discharged to:: Private residence Living Arrangements: Alone Available Help at Discharge: Family;Available PRN/intermittently (brother and nephew (he reports he doesn't want them to stay 24 hr/day, says a phone call away)) Type of Home: Apartment Home Access: Level entry     Home Layout: One level Home Equipment: Walker - 2 wheels;Crutches;Cane - single point Additional Comments: sink is next to toilet    Prior Function Level of Independence: Needs assistance   Gait / Transfers Assistance Needed: Independent with community ambulation  ADL's /  Homemaking Assistance Needed: Independent with ADLs and IADLS  Comments: About 6 weeks ago had fluid drained from knee and since that time has had signfiicant pain and difficulty with mobility, and difficulty straightening knee     Hand Dominance        Extremity/Trunk  Assessment   Upper Extremity Assessment Upper Extremity Assessment: Overall WFL for tasks assessed    Lower Extremity Assessment Lower Extremity Assessment: LLE deficits/detail;RLE deficits/detail RLE Deficits / Details: WFL LLE Deficits / Details: ROM : hip and ankle WFL, knee lacks ~20 degrees ext and to 90 flexion;  MMT : ankle 4/5, knee 1/5, hip 2/5    Cervical / Trunk Assessment Cervical / Trunk Assessment: Normal  Communication   Communication: No difficulties  Cognition Arousal/Alertness: Awake/alert Behavior During Therapy: WFL for tasks assessed/performed Overall Cognitive Status: Within Functional Limits for tasks assessed                                        General Comments General comments (skin integrity, edema, etc.): Pt reports signficant difficulty getting L leg straight for past 6 weeks and majority of his pain is in posterior knee.  At arrival he was laying toward L side with hip ER and knee flexed.  Discussed needs to try to rest on back and straighen knee with quad sets.  Pt verbalized understanding but reports that is painful.  Encouraged to do as much as possible and then coudl turn to comfort position for relief for short periods of time. Pt motivated to get knee straight and reports he will try.    Exercises Total Joint Exercises Ankle Circles/Pumps: AROM;Both;10 reps;Supine Quad Sets: AROM;Both;10 reps;Supine Other Exercises Other Exercises: Standing working on getting heel flat and straightening knee x 10   Assessment/Plan    PT Assessment Patient needs continued PT services  PT Problem List Decreased strength;Decreased mobility;Decreased safety awareness;Decreased coordination;Decreased range of motion;Decreased activity tolerance;Decreased balance;Decreased knowledge of use of DME;Pain       PT Treatment Interventions DME instruction;Therapeutic activities;Modalities;Gait training;Therapeutic exercise;Patient/family  education;Balance training;Functional mobility training    PT Goals (Current goals can be found in the Care Plan section)  Acute Rehab PT Goals Patient Stated Goal: return home PT Goal Formulation: With patient Time For Goal Achievement: 06/22/20 Potential to Achieve Goals: Good Additional Goals Additional Goal #1: Pt will achieve 0 to 90 degrees L knee ROM for gait and stairs    Frequency 7X/week   Barriers to discharge Decreased caregiver support      Co-evaluation               AM-PAC PT "6 Clicks" Mobility  Outcome Measure Help needed turning from your back to your side while in a flat bed without using bedrails?: A Little Help needed moving from lying on your back to sitting on the side of a flat bed without using bedrails?: A Little Help needed moving to and from a bed to a chair (including a wheelchair)?: A Little Help needed standing up from a chair using your arms (e.g., wheelchair or bedside chair)?: A Little Help needed to walk in hospital room?: A Little Help needed climbing 3-5 steps with a railing? : A Lot 6 Click Score: 17    End of Session Equipment Utilized During Treatment: Gait belt Activity Tolerance: Patient tolerated treatment well Patient left: in bed;with call bell/phone within reach;with bed alarm set Nurse Communication:  Mobility status PT Visit Diagnosis: Other abnormalities of gait and mobility (R26.89);Muscle weakness (generalized) (M62.81)    Time: 9323-5573 PT Time Calculation (min) (ACUTE ONLY): 40 min   Charges:   PT Evaluation $PT Eval Low Complexity: 1 Low PT Treatments $Gait Training: 8-22 mins $Therapeutic Exercise: 8-22 mins        Anise Salvo, PT Acute Rehab Services Pager [phone] [facility] [phone]    Maurice Golden 06/08/2020, 6:26 PM

## 2020-06-08 NOTE — Anesthesia Preprocedure Evaluation (Addendum)
Anesthesia Evaluation  Patient identified by MRN, date of birth, ID band Patient awake    Reviewed: Allergy & Precautions, NPO status , Patient's Chart, lab work & pertinent test results  History of Anesthesia Complications Negative for: history of anesthetic complications  Airway Mallampati: III  TM Distance: >3 FB Neck ROM: Full    Dental  (+) Teeth Intact, Dental Advisory Given   Pulmonary neg shortness of breath, sleep apnea and Continuous Positive Airway Pressure Ventilation , neg COPD, neg recent URI,  Covid-19 Nucleic Acid Test Results Lab Results      Component                Value               Date                      [place]              NEGATIVE            06/06/2020              breath sounds clear to auscultation       Cardiovascular hypertension, Pt. on medications (-) angina(-) Past MI and (-) CHF + dysrhythmias Atrial Fibrillation  Rhythm:Regular  Left ventricle: The cavity size was normal. Wall thickness was  increased in a pattern of severe LVH. Systolic function was  vigorous. The estimated ejection fraction was in the range of 65%  to 70%. Wall motion was normal; there were no regional wall  motion abnormalities. Doppler parameters are consistent with  abnormal left ventricular relaxation (grade 1 diastolic  dysfunction). The E/e&' ratio is between 8-15, suggesting  indeterminate LV filling pressure.  - Aorta: Aortic root dimension: 43 mm (ED). Ascending aortic  diameter: 40 mm (S).  - Ascending aorta: The ascending aorta was mildly dilated.  - Left atrium: The atrium was mildly dilated.  - Right atrium: The atrium was mildly dilated.    Neuro/Psych negative neurological ROS  negative psych ROS   GI/Hepatic negative GI ROS, Neg liver ROS,   Endo/Other  negative endocrine ROS  Renal/GU negative Renal ROS     Musculoskeletal  (+) Arthritis ,   Abdominal   Peds   Hematology  (+) Blood dyscrasia, anemia , Lab Results      Component                Value               Date                      WBC                      7.0                 06/06/2020                HGB                      10.8 (L)            06/06/2020                HCT                      33.3 (L)            06/06/2020  MCV                      86.7                06/06/2020                PLT                      428 (H)             06/06/2020           Lab Results      Component                Value               Date                      INR                      1.2                 06/06/2020                INR                      1.07                10/27/2016                INR                      1.64                10/20/2016           ptt 29   Anesthesia Other Findings Last Xarelto dose 8/23 pm  Reproductive/Obstetrics                            Anesthesia Physical Anesthesia Plan  ASA: II  Anesthesia Plan: MAC, Regional and Spinal   Post-op Pain Management:  Regional for Post-op pain   Induction: Intravenous  PONV Risk Score and Plan: 1 and Propofol infusion and Treatment may vary due to age or medical condition  Airway Management Planned: Nasal Cannula  Additional Equipment: None  Intra-op Plan:   Post-operative Plan:   Informed Consent: I have reviewed the patients History and Physical, chart, labs and discussed the procedure including the risks, benefits and alternatives for the proposed anesthesia with the patient or authorized representative who has indicated his/her understanding and acceptance.     Dental advisory given  Plan Discussed with: CRNA and Surgeon  Anesthesia Plan Comments:         Anesthesia Quick Evaluation

## 2020-06-08 NOTE — Discharge Instructions (Signed)
1.  You may bear weight on your surgical leg immediately after surgery. 2.  You have a bandage and a waterproof Aquacel dressing over your incision.  These need to stay in place until we see you at your postoperative visit in 2 weeks. 3.  You have a white, compressive TED stocking on your surgical leg.  Please keep this on until we see you for your postoperative visit in 2 weeks.  You can take it off to clean it as necessary. 4.  Elevate and ice your knee several times a day.  Do not place a pillow under your knee. 5.  Use the CPM machine as instructed.  Try to increase flexion by 5 degrees each day. 6.  Take postoperative medicines as directed. 

## 2020-06-08 NOTE — Anesthesia Procedure Notes (Signed)
Spinal  Patient location during procedure: OR Start time: 06/08/2020 7:25 AM End time: 06/08/2020 7:27 AM Staffing Performed: anesthesiologist  Anesthesiologist: Val Eagle, MD Preanesthetic Checklist Completed: patient identified, IV checked, risks and benefits discussed, surgical consent, monitors and equipment checked, pre-op evaluation and timeout performed Spinal Block Patient position: sitting Prep: DuraPrep Patient monitoring: heart rate, cardiac monitor, continuous pulse ox and blood pressure Approach: midline Location: L3-4 Injection technique: single-shot Needle Needle type: Pencan  Needle gauge: 24 G Needle length: 9 cm Assessment Sensory level: T6

## 2020-06-08 NOTE — Anesthesia Procedure Notes (Signed)
Anesthesia Regional Block: Axillary brachial plexus block   Pre-Anesthetic Checklist: ,, timeout performed, Correct Patient, Correct Site, Correct Laterality, Correct Procedure, Correct Position, site marked, Risks and benefits discussed,  Surgical consent,  Pre-op evaluation,  At surgeon's request and post-op pain management  Laterality: Left and Lower  Prep: chloraprep       Needles:  Injection technique: Single-shot     Needle Length: 9cm  Needle Gauge: 22     Additional Needles: Arrow StimuQuik ECHO Echogenic Stimulating PNB Needle  Procedures:,,,, ultrasound used (permanent image in chart),,,,  Narrative:  Start time: 06/08/2020 7:08 AM End time: 06/08/2020 7:11 AM Injection made incrementally with aspirations every 5 mL.  Performed by: Personally  Anesthesiologist: Val Eagle, MD

## 2020-06-09 DIAGNOSIS — M1712 Unilateral primary osteoarthritis, left knee: Secondary | ICD-10-CM | POA: Diagnosis not present

## 2020-06-09 LAB — CBC
HCT: 27.6 % — ABNORMAL LOW (ref 39.0–52.0)
Hemoglobin: 9.1 g/dL — ABNORMAL LOW (ref 13.0–17.0)
MCH: 28.4 pg (ref 26.0–34.0)
MCHC: 33 g/dL (ref 30.0–36.0)
MCV: 86.3 fL (ref 80.0–100.0)
Platelets: 286 10*3/uL (ref 150–400)
RBC: 3.2 MIL/uL — ABNORMAL LOW (ref 4.22–5.81)
RDW: 13.4 % (ref 11.5–15.5)
WBC: 10.7 10*3/uL — ABNORMAL HIGH (ref 4.0–10.5)
nRBC: 0 % (ref 0.0–0.2)

## 2020-06-09 MED ORDER — RIVAROXABAN 10 MG PO TABS: 10.0000 mg | ORAL_TABLET | Freq: Every day | ORAL | 0 refills | Status: DC

## 2020-06-09 MED ORDER — TIZANIDINE HCL 2 MG PO TABS: 2.0000 mg | ORAL_TABLET | Freq: Four times a day (QID) | ORAL | 0 refills | Status: DC | PRN

## 2020-06-09 MED ORDER — OXYCODONE-ACETAMINOPHEN 5-325 MG PO TABS: 1.0000 | ORAL_TABLET | ORAL | 0 refills | Status: DC | PRN

## 2020-06-09 NOTE — Progress Notes (Signed)
Physical Therapy Treatment Patient Details Name: Maurice Golden MRN: [id number] DOB: Jan 25, 1961 Today's Date: 06/09/2020    History of Present Illness Pt is 59 yo male s/p L TKA on 06/08/20.  Pt w PMH including R TKA, morbid obesity, and afib.    PT Comments    Progressing with mobility. Reviewed/practiced exercises and gait training. All education completed. Okay to d/c from PT standpoint.    Follow Up Recommendations        Equipment Recommendations       Recommendations for Other Services       Precautions / Restrictions Precautions Precautions: Fall Restrictions Weight Bearing Restrictions: No LLE Weight Bearing: Weight bearing as tolerated    Mobility  Bed Mobility   Bed Mobility: Sit to Supine;Supine to Sit     Supine to sit: Min guard;HOB elevated Sit to supine: Min assist   General bed mobility comments: Assist for L LE. Increased time.  Transfers Overall transfer level: Needs assistance Equipment used: Rolling walker (2 wheeled) Transfers: Sit to/from Stand Sit to Stand: Supervision            Ambulation/Gait Ambulation/Gait assistance: Supervision Gait Distance (Feet): 75 Feet Assistive device: Rolling walker (2 wheeled) Gait Pattern/deviations: Step-to pattern;Step-through pattern;Decreased stride length;Decreased stance time - left     General Gait Details: Pt still ambulating on toes/forefoot-unable to maintain flat foot during stance. Slow gait speed.   Stairs             Wheelchair Mobility    Modified Rankin (Stroke Patients Only)       Balance Overall balance assessment: Needs assistance         Standing balance support: Bilateral upper extremity supported Standing balance-Leahy Scale: Fair                              Cognition Arousal/Alertness: Awake/alert Behavior During Therapy: WFL for tasks assessed/performed Overall Cognitive Status: Within Functional Limits for tasks assessed                                         Exercises Total Joint Exercises Ankle Circles/Pumps: AROM;Both;10 reps Quad Sets: AROM;Both;10 reps Heel Slides: AAROM;Left;10 reps Hip ABduction/ADduction: AAROM;Left;10 reps Straight Leg Raises: AAROM;Left;10 reps Goniometric ROM: ~10-60 degrees( in supine). Pt able to passively get ~75 degrees flexion in sitting    General Comments        Pertinent Vitals/Pain Pain Assessment: 0-10 Pain Score: 5  Pain Descriptors / Indicators: Discomfort;Sore Pain Intervention(s): Limited activity within patient's tolerance    Home Living                      Prior Function            PT Goals (current goals can now be found in the care plan section) Progress towards PT goals: Progressing toward goals    Frequency           PT Plan      Co-evaluation              AM-PAC PT "6 Clicks" Mobility   Outcome Measure                   End of Session               Time: 6734-1937 PT Time Calculation (  min) (ACUTE ONLY): 45 min  Charges:  $Gait Training: 23-37 mins $Therapeutic Exercise: 8-22 mins                         Faye Ramsay, PT Acute Rehabilitation  Office: [phone] Pager: [phone]

## 2020-06-09 NOTE — Discharge Summary (Signed)
Patient ID: Maurice Golden MRN: [id number] DOB/AGE: 1961-01-02 59 y.o.  Admit date: 06/08/2020 Discharge date: 06/09/2020  Admission Diagnoses:  Principal Problem:   Status post total knee replacement, left Active Problems:   Osteoarthritis of left knee   Discharge Diagnoses:  Same  Past Medical History:  Diagnosis Date  . ANEMIA    PATIENT DENIES  . Arthritis   . Bradycardia   . Hypertension   . Obesity   . OSA (obstructive sleep apnea)    cpap   . Paroxysmal atrial fibrillation (HCC)     Surgeries: Procedure(s): TOTAL KNEE ARTHROPLASTY on 06/08/2020   Consultants:   Discharged Condition: Improved  Hospital Course: Maurice Golden is an 59 y.o. male who was admitted 06/08/2020 for operative treatment ofStatus post total knee replacement, left. Patient has severe unremitting pain that affects sleep, daily activities, and work/hobbies. After pre-op clearance the patient was taken to the operating room on 06/08/2020 and underwent  Procedure(s): TOTAL KNEE ARTHROPLASTY.    Patient was given perioperative antibiotics:  Anti-infectives (From admission, onward)   Start     Dose/Rate Route Frequency Ordered Stop   06/08/20 1400  ceFAZolin (ANCEF) IVPB 2g/100 mL premix        2 g 200 mL/hr over 30 Minutes Intravenous Every 6 hours 06/08/20 1110 06/08/20 2025   06/08/20 0600  ceFAZolin (ANCEF) 3 g in dextrose 5 % 50 mL IVPB        3 g 100 mL/hr over 30 Minutes Intravenous On call to O.R. 06/07/20 3220 06/08/20 2542       Patient was given sequential compression devices, early ambulation, and chemoprophylaxis to prevent DVT.  Patient benefited maximally from hospital stay and there were no complications.    Recent vital signs:  Patient Vitals for the past 24 hrs:  BP Temp Temp src Pulse Resp SpO2 Height Weight  06/09/20 0602 (!) 156/88 98 F (36.7 C) Oral 66 17 98 % -- --  06/09/20 0206 (!) 154/92 98.4 F (36.9 C) Oral 71 16 95 % -- --  06/08/20 2200 (!) 162/92 98.7 F (37.1  C) Oral 75 16 96 % -- --  06/08/20 1751 (!) 150/80 -- -- 67 16 97 % -- --  06/08/20 1530 (!) 159/85 98.3 F (36.8 C) Oral 72 18 99 % -- --  06/08/20 1420 (!) 154/81 97.9 F (36.6 C) Oral 67 15 99 % -- --  06/08/20 1256 (!) 149/76 98.2 F (36.8 C) Oral 64 17 99 % -- --  06/08/20 1120 -- -- -- -- -- -- 6\' 4"  (1.93 m) 133.4 kg  06/08/20 1110 (!) 149/84 98 F (36.7 C) Oral 66 16 98 % -- --  06/08/20 1045 (!) 149/92 98.4 F (36.9 C) -- 63 10 95 % -- --  06/08/20 1030 (!) 149/82 -- -- 71 16 98 % -- --  06/08/20 1015 (!) 142/89 -- -- 66 16 97 % -- --  06/08/20 1000 (!) 142/80 -- -- 69 16 100 % -- --  06/08/20 0945 (!) 152/83 98.5 F (36.9 C) -- 72 20 100 % -- --     Recent laboratory studies:  Recent Labs    06/06/20 1349 06/09/20 0354  WBC 7.0 10.7*  HGB 10.8* 9.1*  HCT 33.3* 27.6*  PLT 428* 286  NA 137  --   K 3.8  --   CL 101  --   CO2 26  --   BUN 27*  --   CREATININE 1.15  --  GLUCOSE 97  --   INR 1.2  --   CALCIUM 9.5  --      Discharge Medications:   Allergies as of 06/09/2020      Reactions   Hydrocodone Itching, Maurice (See Comments)   Severe itching   Maurice Itching, Maurice (See Comments)   All narcotics - Severe itching       Medication List    TAKE these medications   amiodarone 200 MG tablet Commonly known as: PACERONE TAKE 1/2 TABLET BY MOUTH DAILY What changed: when to take this   amLODipine 5 MG tablet Commonly known as: NORVASC TAKE 1 TABLET BY MOUTH EVERY DAY   BIOFREEZE EX Apply 1 application topically as needed (for leg pain).   ELDERBERRY PO Take 2 tablets by mouth daily.   metoprolol succinate 50 MG 24 hr tablet Commonly known as: TOPROL-XL Take 1 tablet (50 mg total) by mouth daily. What changed: when to take this   multivitamin with minerals Tabs tablet Take 3 tablets by mouth daily.   oxyCODONE-acetaminophen 5-325 MG tablet Commonly known as: PERCOCET/ROXICET Take 1 tablet by mouth every 4 (four) hours as needed for severe  pain.   rivaroxaban 10 MG Tabs tablet Commonly known as: XARELTO Take 1 tablet (10 mg total) by mouth daily. What changed:   medication strength  See the new instructions.   tiZANidine 2 MG tablet Commonly known as: ZANAFLEX Take 1 tablet (2 mg total) by mouth every 6 (six) hours as needed for muscle spasms.            Durable Medical Equipment  (From admission, onward)         Start     Ordered   06/08/20 1111  DME Walker rolling  Once       Question:  Patient needs a walker to treat with the following condition  Answer:  Primary osteoarthritis of left knee   06/08/20 1110   06/08/20 1111  DME 3 n 1  Once        06/08/20 1110           Discharge Care Instructions  (From admission, onward)         Start     Ordered   06/09/20 0000  Weight bearing as tolerated        06/09/20 0819          Diagnostic Studies: DG Chest 2 View  Result Date: 06/06/2020 CLINICAL DATA:  Preop evaluation for upcoming knee surgery EXAM: CHEST - 2 VIEW COMPARISON:  10/20/2016 FINDINGS: Cardiac shadow is at the upper limits of normal in size. No focal infiltrate or sizable effusion is seen. No bony abnormality is noted. IMPRESSION: No acute abnormality noted. Electronically Signed   By: Alcide Clever M.D.   On: 06/06/2020 21:30    Disposition: Discharge disposition: 01-Home or Self Care       Discharge Instructions    Call MD / Call 911   Complete by: As directed    If you experience chest pain or shortness of breath, CALL 911 and be transported to the hospital emergency room.  If you develope a fever above 101 F, pus (white drainage) or increased drainage or redness at the wound, or calf pain, call your surgeon's office.   Constipation Prevention   Complete by: As directed    Drink plenty of fluids.  Prune juice may be helpful.  You may use a stool softener, such as Colace (over the counter) 100 mg  twice a day.  Use MiraLax (over the counter) for constipation as needed.    Diet - low sodium heart healthy   Complete by: As directed    Driving restrictions   Complete by: As directed    No driving for 2 weeks   Increase activity slowly as tolerated   Complete by: As directed    Patient may shower   Complete by: As directed    You may shower without a dressing once there is no drainage.  Do not wash over the wound.  If drainage remains, cover wound with plastic wrap and then shower.   Weight bearing as tolerated   Complete by: As directed        Follow-up Information    Jodi Geralds, MD On 06/21/2020.   Specialty: Orthopedic Surgery Why: Post-operative follow-up is scheduled for June 21, 2020 at 10:30 AM. Contact information: [address] [place] [address] [phone]                Signed: Dannielle Burn 06/09/2020, 8:20 AM

## 2020-06-09 NOTE — Plan of Care (Signed)
Plan of care reviewed and discussed with the patient. 

## 2020-06-09 NOTE — TOC Initial Note (Signed)
Transition of Care [facility]) - Initial/Assessment Note    Patient Details  Name: Maurice Golden MRN: [id number] Date of Birth: [birth year]  Transition of Care (TOC) CM/SW Contact:    Armanda Heritage, RN Phone Number: 06/09/2020, 11:55 AM  Clinical Narrative:   CM spoke with patient at bedside. Patient set up with [facility] for HHPT.  Reports has rw at home and declines 3in1.                  Expected Discharge Plan: Home w Home Health Services Barriers to Discharge: No Barriers Identified   Patient Goals and CMS Choice Patient states their goals for this hospitalization and ongoing recovery are:: to go home with therapy CMS Medicare.gov Compare Post Acute Care list provided to:: Patient Choice offered to / list presented to : Patient  Expected Discharge Plan and Services Expected Discharge Plan: Home w Home Health Services   Discharge Planning Services: CM Consult Post Acute Care Choice: Home Health Living arrangements for the past 2 months: Single Family Home Expected Discharge Date: 06/09/20               DME Arranged: [name] DME Agency: NA       HH Arranged: PT HH Agency: [facility]     Representative spoke with at [facility] Agency: pre-arranged in MD office  Prior Living Arrangements/Services Living arrangements for the past 2 months: Single Family Home   Patient language and need for interpreter reviewed:: Yes Do you feel safe going back to the place where you live?: Yes      Need for Family Participation in Patient Care: Yes (Comment) Care giver support system in place?: Yes (comment)   Criminal Activity/Legal Involvement Pertinent to Current Situation/Hospitalization: No - Comment as needed  Activities of Daily Living Home Assistive Devices/Equipment: Crutches, CPAP, Eyeglasses, Hand-held shower hose ADL Screening (condition at time of admission) Patient's cognitive ability adequate to safely complete daily activities?: Yes Is the patient  deaf or have difficulty hearing?: No Does the patient have difficulty seeing, even when wearing glasses/contacts?: No Does the patient have difficulty concentrating, remembering, or making decisions?: No Patient able to express need for assistance with ADLs?: Yes Does the patient have difficulty dressing or bathing?: No Independently performs ADLs?: Yes (appropriate for developmental age) Does the patient have difficulty walking or climbing stairs?: Yes Weakness of Legs: None Weakness of Arms/Hands: None  Permission Sought/Granted                  Emotional Assessment Appearance:: Appears stated age Attitude/Demeanor/Rapport: Engaged Affect (typically observed): Accepting Orientation: : Oriented to Self, Oriented to Place, Oriented to  Time, Oriented to Situation   Psych Involvement: No (comment)  Admission diagnosis:  Status post total knee replacement, left [Z96.652] Patient Active Problem List   Diagnosis Date Noted  . Osteoarthritis of left knee 06/08/2020  . Status post total knee replacement, left 06/08/2020  . Postoperative anemia due to acute blood loss 10/30/2016  . Primary osteoarthritis of right knee 10/27/2016  . Dizziness   . Persistent atrial fibrillation (HCC)   . Cardiomyopathy (HCC) 01/25/2014  . Abnormal EKG 11/17/2011  . Atrial fibrillation (HCC) 09/10/2011  . Obstructive sleep apnea 02/05/2010  . OBESITY, MORBID 02/04/2010  . ANEMIA 02/04/2010  . Essential hypertension 02/04/2010  . BRADYCARDIA 02/04/2010   PCP:  Jarrett Soho, PA-C Pharmacy:   [phone] - [place], [place] - [address]  [place] [address] Phone: [phone] Fax: [phone]  [facility] - [place], [place] - [address] [place] [address] Phone: [phone] Fax: [phone]  [id number] [place], [place] - [address] AT  [facility] [address] [place] [address] Phone: [phone] Fax: [phone]     Social Determinants of Health (SDOH) Interventions    Readmission Risk Interventions No flowsheet data found.

## 2020-06-09 NOTE — Progress Notes (Signed)
PATIENT ID: Maurice Golden  MRN: [id number]  DOB/AGE:  07/08/1961 / 59 y.o.  1 Day Post-Op Procedure(s) (LRB): TOTAL KNEE ARTHROPLASTY (Left)    PROGRESS NOTE Subjective: Patient is alert, oriented, no Nausea, no Vomiting, yes passing gas. Taking PO well. Denies SOB, Chest or Calf Pain. Using Incentive Spirometer, PAS in place. Ambulate WBAT with pt walking 12 ft, Patient reports pain as 5/10 .    Objective: Vital signs in last 24 hours: Vitals:   06/08/20 1751 06/08/20 2200 06/09/20 0206 06/09/20 0602  BP: (!) 150/80 (!) 162/92 (!) 154/92 (!) 156/88  Pulse: 67 75 71 66  Resp: 16 16 16 17   Temp:  98.7 F (37.1 C) 98.4 F (36.9 C) 98 F (36.7 C)  TempSrc:  Oral Oral Oral  SpO2: 97% 96% 95% 98%  Weight:      Height:          Intake/Output from previous day: I/O last 3 completed shifts: In: 4175.9 [P.O.:1200; I.V.:2802.7; IV Piggyback:173.2] Out: 3425 [Urine:3375; Blood:50]   Intake/Output this shift: No intake/output data recorded.   LABORATORY DATA: Recent Labs    06/06/20 1349 06/09/20 0354  WBC 7.0 10.7*  HGB 10.8* 9.1*  HCT 33.3* 27.6*  PLT 428* 286  NA 137  --   K 3.8  --   CL 101  --   CO2 26  --   BUN 27*  --   CREATININE 1.15  --   GLUCOSE 97  --   INR 1.2  --   CALCIUM 9.5  --     Examination: Neurologically intact Neurovascular intact Sensation intact distally Intact pulses distally Dorsiflexion/Plantar flexion intact Incision: dressing C/D/I and no drainage No cellulitis present Compartment soft}  Assessment:   1 Day Post-Op Procedure(s) (LRB): TOTAL KNEE ARTHROPLASTY (Left) ADDITIONAL DIAGNOSIS: Expected Acute Blood Loss Anemia, Cardiac Arrythmia afib  Patient's anticipated LOS is less than 2 midnights, meeting these requirements: - Younger than 29 - Lives within 1 hour of care - Has a competent adult at home to recover with post-op recover - NO history of  - Chronic pain requiring opiods  - Diabetes  - Coronary Artery Disease  - Heart  failure  - Heart attack  - Stroke  - DVT/VTE  - Cardiac arrhythmia  - Respiratory Failure/COPD  - Renal failure  - Anemia  - Advanced Liver disease       Plan: PT/OT WBAT, AROM and PROM  DVT Prophylaxis:  SCDx72hrs, xarelto 1/2 normal dose x 2 weeks DISCHARGE PLAN: Home DISCHARGE NEEDS: HHPT, CPM, Walker and 3-in-1 comode seat     59 06/09/2020, 8:12 AM

## 2020-06-09 NOTE — Progress Notes (Signed)
Orthopedic Tech Progress Note Patient Details:  Maurice Golden 1961/05/10 [id number] Patient discharged picked up cpm. Patient ID: [name], male   DOB: 10-06-1961, 59 y.o.   MRN: [id number]   [name] 06/09/2020, 3:57 PM

## 2020-06-11 ENCOUNTER — Encounter (HOSPITAL_COMMUNITY): Payer: Self-pay | Admitting: Orthopedic Surgery

## 2020-06-14 ENCOUNTER — Encounter (HOSPITAL_COMMUNITY): Payer: Self-pay | Admitting: Orthopedic Surgery

## 2020-06-14 NOTE — Anesthesia Postprocedure Evaluation (Signed)
Anesthesia Post Note  Patient: Markeise Mathews  Procedure(s) Performed: TOTAL KNEE ARTHROPLASTY (Left Knee)     Patient location during evaluation: PACU Anesthesia Type: Regional, MAC and Spinal Level of consciousness: awake and alert Pain management: pain level controlled Vital Signs Assessment: post-procedure vital signs reviewed and stable Respiratory status: spontaneous breathing, nonlabored ventilation, respiratory function stable and patient connected to nasal cannula oxygen Cardiovascular status: stable and blood pressure returned to baseline Postop Assessment: no apparent nausea or vomiting and spinal receding Anesthetic complications: no   No complications documented.  Last Vitals:  Vitals:   06/09/20 0951 06/09/20 1307  BP: (!) 155/91 (!) 145/82  Pulse: 64 (!) 58  Resp: 18 14  Temp: 37.1 C 36.7 C  SpO2: 97% 94%    Last Pain:  Vitals:   06/09/20 1307  TempSrc: Oral  PainSc:                  Tashari Schoenfelder

## 2020-09-14 ENCOUNTER — Telehealth: Payer: Self-pay | Admitting: Internal Medicine

## 2020-09-14 MED ORDER — RIVAROXABAN 20 MG PO TABS: 20.0000 mg | ORAL_TABLET | Freq: Every day | ORAL | 3 refills | Status: DC

## 2020-09-14 NOTE — Telephone Encounter (Signed)
Pt c/o medication issue:  1. Name of Medication: rivaroxaban (XARELTO) 10 MG TABS tablet  2. How are you currently taking this medication (dosage and times per day)? As written   3. Are you having a reaction (difficulty breathing--STAT)? No   4. What is your medication issue? Patient needs a new prescription filled to receive medication

## 2020-12-04 ENCOUNTER — Other Ambulatory Visit: Payer: Self-pay | Admitting: Internal Medicine

## 2021-01-08 ENCOUNTER — Other Ambulatory Visit: Payer: Self-pay

## 2021-01-08 MED ORDER — AMIODARONE HCL 200 MG PO TABS
100.0000 mg | ORAL_TABLET | Freq: Every day | ORAL | 0 refills | Status: DC
Start: 2021-01-08 — End: 2021-04-09

## 2021-01-08 NOTE — Telephone Encounter (Signed)
Pt's medication was sent to pt's pharmacy as requested. Confirmation received.  °

## 2021-01-21 ENCOUNTER — Ambulatory Visit: Payer: Medicare Other | Admitting: Internal Medicine

## 2021-01-21 DIAGNOSIS — G4733 Obstructive sleep apnea (adult) (pediatric): Secondary | ICD-10-CM

## 2021-01-21 DIAGNOSIS — I440 Atrioventricular block, first degree: Secondary | ICD-10-CM

## 2021-01-21 DIAGNOSIS — I4819 Other persistent atrial fibrillation: Secondary | ICD-10-CM

## 2021-01-21 DIAGNOSIS — I10 Essential (primary) hypertension: Secondary | ICD-10-CM

## 2021-01-23 ENCOUNTER — Other Ambulatory Visit: Payer: Self-pay

## 2021-01-23 ENCOUNTER — Encounter: Payer: Self-pay | Admitting: Student

## 2021-01-23 ENCOUNTER — Ambulatory Visit (INDEPENDENT_AMBULATORY_CARE_PROVIDER_SITE_OTHER): Payer: Medicare Other | Admitting: Student

## 2021-01-23 VITALS — BP 144/98 | HR 69 | Ht 76.0 in | Wt 311.0 lb

## 2021-01-23 DIAGNOSIS — I428 Other cardiomyopathies: Secondary | ICD-10-CM

## 2021-01-23 DIAGNOSIS — G4733 Obstructive sleep apnea (adult) (pediatric): Secondary | ICD-10-CM

## 2021-01-23 DIAGNOSIS — I4819 Other persistent atrial fibrillation: Secondary | ICD-10-CM | POA: Diagnosis not present

## 2021-01-23 DIAGNOSIS — I10 Essential (primary) hypertension: Secondary | ICD-10-CM

## 2021-01-23 DIAGNOSIS — Z79899 Other long term (current) drug therapy: Secondary | ICD-10-CM

## 2021-01-23 NOTE — Progress Notes (Signed)
PCP:  Jarrett Soho, PA-C Primary Cardiologist: No primary care provider on file. Electrophysiologist: Hillis Range, MD   Maurice Golden is a 60 y.o. male seen today for Hillis Range, MD for routine electrophysiology followup.  Since last being seen in our clinic the patient reports doing very well. He denies any current symptoms.  he denies chest pain, palpitations, dyspnea, PND, orthopnea, nausea, vomiting, dizziness, syncope, edema, weight gain, or early satiety.  Past Medical History:  Diagnosis Date  . ANEMIA    PATIENT DENIES  . Arthritis   . Bradycardia   . Hypertension   . Obesity   . OSA (obstructive sleep apnea)    cpap   . Paroxysmal atrial fibrillation [facility])    Past Surgical History:  Procedure Laterality Date  . ABLATION  01/26/14   PVI by Dr Johney Frame  . APPENDECTOMY    . ATRIAL FIBRILLATION ABLATION [name] 01/26/2014   Procedure: ATRIAL FIBRILLATION ABLATION;  Surgeon: Gardiner Rhyme, MD;  [facility] LAB;  Service: Cardiovascular;  Laterality: [name];  . BILATERAL KNEE ARTHROSCOPY    . CARDIOVERSION  04/29/2012   Procedure: CARDIOVERSION;  Surgeon: Vesta Mixer, MD;  Location: [facility] ENDOSCOPY;  Service: Cardiovascular;  Laterality: [name];  . CARDIOVERSION [name] 12/21/2013   Procedure: CARDIOVERSION;  Surgeon: Wendall Stade, MD;  Location: [facility] ENDOSCOPY;  Service: Cardiovascular;  Laterality: [name];  . CARDIOVERSION [name] 05/30/2014   Procedure: CARDIOVERSION;  Surgeon: Laurey Morale, MD;  Location: [facility] ENDOSCOPY;  Service: Cardiovascular;  Laterality: [name];  . CARDIOVERSION [name] 04/26/2015   Procedure: CARDIOVERSION;  Surgeon: [name], MD;  Location: [facility] ENDOSCOPY;  Service: Cardiovascular;  Laterality: [name];  . CARDIOVERSION [name] 07/21/2017   Procedure: CARDIOVERSION;  Surgeon: [name], MD;  Location: [facility] ENDOSCOPY;  Service: Cardiovascular;  Laterality: [name];  . CATARACT EXTRACTION Right 2012  . EYE SURGERY    . L eye cataract     bilat  . TEE WITHOUT  CARDIOVERSION  04/29/2012   Procedure: TRANSESOPHAGEAL ECHOCARDIOGRAM (TEE);  Surgeon: Vesta Mixer, MD;  Location: [facility] ENDOSCOPY;  Service: Cardiovascular;  Laterality: [name];  changed from nish to nahser/dl  . TEE WITHOUT CARDIOVERSION [name] 01/25/2014   Procedure: TRANSESOPHAGEAL ECHOCARDIOGRAM (TEE);  Surgeon: [name], MD;  Location: [facility] ENDOSCOPY;  Service: Cardiovascular;  Laterality: [name];  . TOTAL KNEE ARTHROPLASTY Right 10/27/2016   Procedure: TOTAL KNEE ARTHROPLASTY;  Surgeon: Jodi Geralds, MD;  Location: [facility] OR;  Service: Orthopedics;  Laterality: Right;  . TOTAL KNEE ARTHROPLASTY Left 06/08/2020   Procedure: TOTAL KNEE ARTHROPLASTY;  Surgeon: Jodi Geralds, MD;  Location: [facility] ORS;  Service: Orthopedics;  Laterality: Left;    Current Outpatient Medications  Medication Sig Dispense Refill  . acetaminophen (TYLENOL) 500 MG tablet as needed.    Marland Kitchen amiodarone (PACERONE) 200 MG tablet Take 0.5 tablets (100 mg total) by mouth daily. 45 tablet 0  . amLODipine (NORVASC) 5 MG tablet Take 1 tablet (5 mg total) by mouth daily. Please make yearly appt with Dr. Johney Frame for March 2022 for future refills. Thank yo 1st attempt 90 tablet 0  . ELDERBERRY PO Take 2 tablets by mouth daily.    . Menthol, Topical Analgesic, (BIOFREEZE EX) Apply 1 application topically as needed (for leg pain).     . metoprolol succinate (TOPROL-XL) 50 MG 24 hr tablet Take 1 tablet (50 mg total) by mouth daily. 90 tablet 3  . Multiple Vitamin (MULTIVITAMIN WITH MINERALS) TABS tablet Take 3 tablets by mouth daily.    Marland Kitchen  oxyCODONE-acetaminophen (PERCOCET/ROXICET) 5-325 MG tablet Take 1 tablet by mouth every 4 (four) hours as needed for severe pain. 30 tablet 0  . rivaroxaban (XARELTO) 20 MG TABS tablet Take 1 tablet (20 mg total) by mouth daily with supper. 90 tablet 3  . tiZANidine (ZANAFLEX) 2 MG tablet Take 1 tablet (2 mg total) by mouth every 6 (six) hours as needed for muscle spasms. 60 tablet 0   No current  facility-administered medications for this visit.    Allergies  Allergen Reactions  . Hydrocodone Itching and Other (See Comments)    Severe itching  . Other Itching and Other (See Comments)    All narcotics - Severe itching     Social History   Socioeconomic History  . Marital status: Single    Spouse name: Not on file  . Number of children: Not on file  . Years of education: Not on file  . Highest education level: Not on file  Occupational History  . Occupation: retired  Tobacco Use  . Smoking status: Never Smoker  . Smokeless tobacco: Never Used  Vaping Use  . Vaping Use: Never used  Substance and Sexual Activity  . Alcohol use: No  . Drug use: No  . Sexual activity: Yes  Other Topics Concern  . Not on file  Social History Narrative   Lives with mother in [place].  Retired/ disabled from [employer].   Social Determinants of Health   Financial Resource Strain: Not on file  Food Insecurity: Not on file  Transportation Needs: Not on file  Physical Activity: Not on file  Stress: Not on file  Social Connections: Not on file  Intimate Partner Violence: Not on file     Review of Systems: General: No chills, fever, night sweats or weight changes  Cardiovascular:  No chest pain, dyspnea on exertion, edema, orthopnea, palpitations, paroxysmal nocturnal dyspnea Dermatological: No rash, lesions or masses Respiratory: No cough, dyspnea Urologic: No hematuria, dysuria Abdominal: No nausea, vomiting, diarrhea, bright red blood per rectum, melena, or hematemesis Neurologic: No visual changes, weakness, changes in mental status All other systems reviewed and are otherwise negative except as noted above.  Physical Exam: Vitals:   01/23/21 1148  BP: (!) 144/98  Pulse: 69  SpO2: 95%  Weight: (!) 311 lb (141.1 kg)  Height: 6\' 4"  (1.93 m)    GEN- The patient is well appearing, alert and oriented x 3 today.   HEENT: normocephalic, atraumatic; sclera clear, conjunctiva  pink; hearing intact; oropharynx clear; neck supple, no JVP Lymph- no cervical lymphadenopathy Lungs- Clear to ausculation bilaterally, normal work of breathing.  No wheezes, rales, rhonchi Heart- Regular rate and rhythm, no murmurs, rubs or gallops, PMI not laterally displaced GI- soft, non-tender, non-distended, bowel sounds present, no hepatosplenomegaly Extremities- no clubbing, cyanosis, or edema; DP/PT/radial pulses 2+ bilaterally MS- no significant deformity or atrophy Skin- warm and dry, no rash or lesion Psych- euthymic mood, full affect Neuro- strength and sensation are intact  EKG is ordered. Personal review of EKG from today shows NSR at 69 bpm, QRS 104 ms, PR interval 258 ms (stable)  Additional studies reviewed include: Previous EKG,labs and EP notes  Assessment and Plan:  1. Persistent atrial fibrillation Has had issues with visit compliance. Stressed importance of close follow-up to avoid toxicity was discussed today while on amiodarone Check surveillance labs today.   2. Hypertensive cardiovascular disease Stable Continue toprol 50 mg daily due to ED.  Update Echo. He would like to have this done  in September so it can be discussed at his 6 month follow up.   3. OSA Compliant with CPAP  4. Obesity Body mass index is 37.86 kg/m.  Lifestyle modification is advised  5. ED Has previously refused viagra/ cialis  Continue toprol 50mg  daily for now.  6. First degree AV block Monitor closely on toprol.  PR interval of 270 ms at last visit, 258 ms today.   , PA-C  01/23/21 11:53 AM

## 2021-01-23 NOTE — Patient Instructions (Addendum)
Medication Instructions:  Your physician recommends that you continue on your current medications as directed. Please refer to the Current Medication list given to you today.  *If you need a refill on your cardiac medications before your next appointment, please call your pharmacy*   Lab Work: TODAY: CMET, CBC, TSH  If you have labs (blood work) drawn today and your tests are completely normal, you will receive your results only by: Marland Kitchen MyChart Message (if you have MyChart) OR . A paper copy in the mail If you have any lab test that is abnormal or we need to change your treatment, we will call you to review the results.  TESTING: Your physician has requested that you have an echocardiogram. Echocardiography is a painless test that uses sound waves to create images of your heart. It provides your doctor with information about the size and shape of your heart and how well your heart's chambers and valves are working. This procedure takes approximately one hour. There are no restrictions for this procedure.  Follow-Up: At [facility], you and your health needs are our priority.  As part of our continuing mission to provide you with exceptional heart care, we have created designated Provider Care Teams.  These Care Teams include your primary Cardiologist (physician) and Advanced Practice Providers (APPs -  Physician Assistants and Nurse Practitioners) who all work together to provide you with the care you need, when you need it. Your next appointment:   6 month(s)  The format for your next appointment:   In Person  Provider:   You may see Hillis Range, MD or one of the following Advanced Practice Providers on your designated Care Team:    Gypsy Balsam, NP  Francis Dowse, PA-C  Casimiro Needle "[place]" [place], [place]

## 2021-01-24 DIAGNOSIS — Z0279 Encounter for issue of other medical certificate: Secondary | ICD-10-CM

## 2021-01-24 LAB — CBC
Hematocrit: 37.4 % — ABNORMAL LOW (ref 37.5–51.0)
Hemoglobin: 12.6 g/dL — ABNORMAL LOW (ref 13.0–17.7)
MCH: 28.3 pg (ref 26.6–33.0)
MCHC: 33.7 g/dL (ref 31.5–35.7)
MCV: 84 fL (ref 79–97)
Platelets: 304 10*3/uL (ref 150–450)
RBC: 4.46 x10E6/uL (ref 4.14–5.80)
RDW: 13.4 % (ref 11.6–15.4)
WBC: 5.5 10*3/uL (ref 3.4–10.8)

## 2021-01-24 LAB — COMPREHENSIVE METABOLIC PANEL
ALT: 13 IU/L (ref 0–44)
AST: 19 IU/L (ref 0–40)
Albumin/Globulin Ratio: 1.2 (ref 1.2–2.2)
Albumin: 4.2 g/dL (ref 3.8–4.9)
Alkaline Phosphatase: 78 IU/L (ref 44–121)
BUN/Creatinine Ratio: 15 (ref 10–24)
BUN: 17 mg/dL (ref 8–27)
Bilirubin Total: 0.5 mg/dL (ref 0.0–1.2)
CO2: 22 mmol/L (ref 20–29)
Calcium: 9.4 mg/dL (ref 8.6–10.2)
Chloride: 102 mmol/L (ref 96–106)
Creatinine, Ser: 1.16 mg/dL (ref 0.76–1.27)
Globulin, Total: 3.5 g/dL (ref 1.5–4.5)
Glucose: 101 mg/dL — ABNORMAL HIGH (ref 65–99)
Potassium: 4.1 mmol/L (ref 3.5–5.2)
Sodium: 140 mmol/L (ref 134–144)
Total Protein: 7.7 g/dL (ref 6.0–8.5)
eGFR: 72 mL/min/{1.73_m2} (ref >59)

## 2021-01-24 LAB — TSH: TSH: 0.492 u[IU]/mL (ref 0.450–4.500)

## 2021-01-30 ENCOUNTER — Telehealth: Payer: Self-pay | Admitting: Internal Medicine

## 2021-01-30 NOTE — Telephone Encounter (Signed)
Patient dropped off APS form. Form put in Dr. Jenel Lucks box to be completed. AO 01/30/21

## 2021-02-08 ENCOUNTER — Other Ambulatory Visit: Payer: Self-pay | Admitting: Internal Medicine

## 2021-02-13 NOTE — Telephone Encounter (Signed)
Called and spoke to patient to inform him that his form was ready to be picked up as requested.  AO 02/13/21

## 2021-04-08 ENCOUNTER — Other Ambulatory Visit: Payer: Self-pay | Admitting: Internal Medicine

## 2021-04-13 ENCOUNTER — Other Ambulatory Visit: Payer: Self-pay | Admitting: Internal Medicine

## 2021-04-16 NOTE — Telephone Encounter (Signed)
Rx(s) sent to pharmacy electronically.  

## 2021-06-24 ENCOUNTER — Other Ambulatory Visit (HOSPITAL_COMMUNITY): Payer: Medicare Other

## 2021-07-13 ENCOUNTER — Other Ambulatory Visit: Payer: Self-pay | Admitting: Internal Medicine

## 2021-07-15 ENCOUNTER — Ambulatory Visit (HOSPITAL_COMMUNITY): Payer: Medicare Other | Attending: Cardiovascular Disease

## 2021-07-15 ENCOUNTER — Other Ambulatory Visit: Payer: Self-pay

## 2021-07-15 DIAGNOSIS — I428 Other cardiomyopathies: Secondary | ICD-10-CM | POA: Insufficient documentation

## 2021-07-15 LAB — ECHOCARDIOGRAM COMPLETE
Area-P 1/2: 2.91 cm2
S' Lateral: 3.4 cm

## 2021-09-20 ENCOUNTER — Encounter: Payer: Self-pay | Admitting: Internal Medicine

## 2021-09-20 ENCOUNTER — Ambulatory Visit: Payer: Medicare Other | Admitting: Internal Medicine

## 2021-09-20 ENCOUNTER — Other Ambulatory Visit: Payer: Self-pay

## 2021-09-20 VITALS — BP 136/74 | HR 70 | Ht 76.0 in | Wt 326.0 lb

## 2021-09-20 DIAGNOSIS — I10 Essential (primary) hypertension: Secondary | ICD-10-CM | POA: Diagnosis not present

## 2021-09-20 DIAGNOSIS — I4819 Other persistent atrial fibrillation: Secondary | ICD-10-CM

## 2021-09-20 DIAGNOSIS — G4733 Obstructive sleep apnea (adult) (pediatric): Secondary | ICD-10-CM | POA: Diagnosis not present

## 2021-09-20 DIAGNOSIS — I440 Atrioventricular block, first degree: Secondary | ICD-10-CM

## 2021-09-20 MED ORDER — METOPROLOL SUCCINATE ER 25 MG PO TB24: 25.0000 mg | ORAL_TABLET | Freq: Every day | ORAL | 3 refills | Status: DC

## 2021-09-20 NOTE — Progress Notes (Signed)
PCP: Jarrett Soho, PA-C   Primary EP: Dr Merton Border Sovereign Maurice Golden is a 60 y.o. male who presents today for routine electrophysiology followup.  Since last being seen in our clinic, the patient reports doing very well.  Today, he denies symptoms of palpitations, chest pain, shortness of breath,  lower extremity edema, dizziness, presyncope, or syncope.  The patient is otherwise without complaint today.   Past Medical History:  Diagnosis Date   ANEMIA    PATIENT DENIES   Arthritis    Bradycardia    Hypertension    Obesity    OSA (obstructive sleep apnea)    cpap    Paroxysmal atrial fibrillation [facility])    Past Surgical History:  Procedure Laterality Date   ABLATION  01/26/14   PVI by Dr Johney Frame   APPENDECTOMY     ATRIAL FIBRILLATION ABLATION [name] 01/26/2014   Procedure: ATRIAL FIBRILLATION ABLATION;  Surgeon: Gardiner Rhyme, MD;  [facility]: [facility] CATH LAB;  Service: Cardiovascular;  Laterality: [name];   BILATERAL KNEE ARTHROSCOPY     CARDIOVERSION  04/29/2012   Procedure: CARDIOVERSION;  Surgeon: Vesta Mixer, MD;  Location: [facility] ENDOSCOPY;  Service: Cardiovascular;  Laterality: [name];   CARDIOVERSION [name] 12/21/2013   Procedure: CARDIOVERSION;  Surgeon: Wendall Stade, MD;  Location: [facility] ENDOSCOPY;  Service: Cardiovascular;  Laterality: [name];   CARDIOVERSION [name] 05/30/2014   Procedure: CARDIOVERSION;  Surgeon: Laurey Morale, MD;  Location: [facility] ENDOSCOPY;  Service: Cardiovascular;  Laterality: [name];   CARDIOVERSION [name] 04/26/2015   Procedure: CARDIOVERSION;  Surgeon: [name], MD;  Location: [facility] ENDOSCOPY;  Service: Cardiovascular;  Laterality: [name];   CARDIOVERSION [name] 07/21/2017   Procedure: CARDIOVERSION;  Surgeon: [name], MD;  Location: [facility] ENDOSCOPY;  Service: Cardiovascular;  Laterality: [name];   CATARACT EXTRACTION Right 2012   EYE SURGERY     L eye cataract     bilat   TEE WITHOUT CARDIOVERSION  04/29/2012   Procedure: TRANSESOPHAGEAL ECHOCARDIOGRAM (TEE);  Surgeon:  Vesta Mixer, MD;  Location: [facility] ENDOSCOPY;  Service: Cardiovascular;  Laterality: [name];  changed from nish to nahser/dl   TEE WITHOUT CARDIOVERSION [name] 01/25/2014   Procedure: TRANSESOPHAGEAL ECHOCARDIOGRAM (TEE);  Surgeon: [name], MD;  Location: [facility] ENDOSCOPY;  Service: Cardiovascular;  Laterality: [name];   TOTAL KNEE ARTHROPLASTY Right 10/27/2016   Procedure: TOTAL KNEE ARTHROPLASTY;  Surgeon: Jodi Geralds, MD;  Location: [facility] OR;  Service: Orthopedics;  Laterality: Right;   TOTAL KNEE ARTHROPLASTY Left 06/08/2020   Procedure: TOTAL KNEE ARTHROPLASTY;  Surgeon: Jodi Geralds, MD;  Location: [facility] ORS;  Service: Orthopedics;  Laterality: Left;    ROS- all systems are reviewed and negatives except as per HPI above  Current Outpatient Medications  Medication Sig Dispense Refill   acetaminophen (TYLENOL) 500 MG tablet as needed.     amiodarone (PACERONE) 200 MG tablet TAKE 1/2 TABLET BY MOUTH DAILY 45 tablet 1   amLODipine (NORVASC) 5 MG tablet Take 1 tablet (5 mg total) by mouth daily. 90 tablet 3   ELDERBERRY PO Take 2 tablets by mouth daily.     metoprolol succinate (TOPROL-XL) 50 MG 24 hr tablet TAKE 1 TABLET BY MOUTH EVERY DAY 90 tablet 1   Multiple Vitamin (MULTIVITAMIN WITH MINERALS) TABS tablet Take 3 tablets by mouth daily.     rivaroxaban (XARELTO) 20 MG TABS tablet Take 1 tablet (20 mg total) by mouth daily with supper. 90 tablet 3   Menthol, Topical Analgesic, (BIOFREEZE EX) Apply 1 application  topically as needed (for leg pain).      oxyCODONE-acetaminophen (PERCOCET/ROXICET) 5-325 MG tablet Take 1 tablet by mouth every 4 (four) hours as needed for severe pain. 30 tablet 0   tiZANidine (ZANAFLEX) 2 MG tablet Take 1 tablet (2 mg total) by mouth every 6 (six) hours as needed for muscle spasms. 60 tablet 0   No current facility-administered medications for this visit.    Physical Exam: Vitals:   09/20/21 1535  BP: 136/74  Pulse: 70  SpO2: 98%  Weight: (!) 326 lb (147.9 kg)   Height: 6\' 4"  (1.93 m)    GEN- The patient is well appearing, alert and oriented x 3 today.   Head- normocephalic, atraumatic Eyes-  Sclera clear, conjunctiva pink Ears- hearing intact Oropharynx- clear Lungs- Clear to ausculation bilaterally, normal work of breathing Heart- Regular rate and rhythm, no murmurs, rubs or gallops, PMI not laterally displaced GI- soft, NT, ND, + BS Extremities- no clubbing, cyanosis, or edema  Wt Readings from Last 3 Encounters:  09/20/21 (!) 326 lb (147.9 kg)  01/23/21 (!) 311 lb (141.1 kg)  06/08/20 294 lb 1.5 oz (133.4 kg)    EKG tracing ordered today is personally reviewed and shows sinus rhythm, PR 282 msec (stable)  Assessment and Plan:  Persistent afib Well controlled with amiodarone 100mg  daily Reduce toprol to 25mg  daily Labs 01/23/21 reviewed Repeat TSH, LFTs, CXR today  2. HTN Stable No change required today  3. OSA Uses CPAP  4. Obesity Body mass index is 39.68 kg/m. Lifestyle modification advised  5. First degree AV block Stable Caution with AV nodal agents Reduce toprol to 25mg  daily  6. ED Stable No change required today  7. Abnormal TSH Repeat level today I have advised that he follow-up with PCP to discuss.  He has an appointment 10/18/21 already  Risks, benefits and potential toxicities for medications prescribed and/or refilled reviewed with patient today.   Return to see me in 6 months  MD, [facility] 09/20/2021 3:58 PM

## 2021-09-20 NOTE — Patient Instructions (Addendum)
Medication Instructions:  Reduce Metoprolol Succinate to 25 mg daily Your physician recommends that you continue on your current medications as directed. Please refer to the Current Medication list given to you today. *If you need a refill on your cardiac medications before your next appointment, please call your pharmacy*  Lab Work: On Wednesday 09/25/21 If you have labs (blood work) drawn today and your tests are completely normal, you will receive your results only by: MyChart Message (if you have MyChart) OR A paper copy in the mail If you have any lab test that is abnormal or we need to change your treatment, we will call you to review the results.  Testing/Procedures: A chest x-ray takes a picture of the organs and structures inside the chest, including the heart, lungs, and blood vessels. This test can show several things, including, whether the heart is enlarges; whether fluid is building up in the lungs; and whether pacemaker / defibrillator leads are still in place.   Follow-Up: At [facility], you and your health needs are our priority.  As part of our continuing mission to provide you with exceptional heart care, we have created designated Provider Care Teams.  These Care Teams include your primary Cardiologist (physician) and Advanced Practice Providers (APPs -  Physician Assistants and Nurse Practitioners) who all work together to provide you with the care you need, when you need it.  Your physician wants you to follow-up in: 6 months with  Hillis Range, MD     You will receive a reminder letter in the mail two months in advance. If you don't receive a letter, please call our office to schedule the follow-up appointment.  We recommend signing up for the patient portal called "MyChart".  Sign up information is provided on this After Visit Summary.  MyChart is used to connect with patients for Virtual Visits (Telemedicine).  Patients are able to view lab/test results, encounter  notes, upcoming appointments, etc.  Non-urgent messages can be sent to your provider as well.   To learn more about what you can do with MyChart, go to [email or web address].    Any Other Special Instructions Will Be Listed Below (If Applicable).  Chest X-ray Instructions:    1. You may have this done at the [facility], located in the [facility] Building on the 1st floor.    2. You do no have to have an appointment.    3. [address] [place], [place] [address]        [phone]        Monday - Friday  8:00 am - 5:00 pm

## 2021-09-24 ENCOUNTER — Other Ambulatory Visit: Payer: Self-pay | Admitting: Internal Medicine

## 2021-09-25 ENCOUNTER — Other Ambulatory Visit: Payer: Medicare Other

## 2021-11-05 ENCOUNTER — Telehealth: Payer: Self-pay | Admitting: *Deleted

## 2021-11-05 NOTE — Telephone Encounter (Addendum)
Patient with diagnosis of Afib on Xarelto for anticoagulation.    Procedure: Colonoscopy Date of procedure: 01/21/22  CHA2DS2-VASc Score = 1  This indicates a 0.6% annual risk of stroke. The patient's score is based upon: CHF History: 0 HTN History: 1 Diabetes History: 0 Stroke History: 0 Vascular Disease History: 0 Age Score: 0 Gender Score: 0     CrCl 140 mL/min Platelet count 304k  Per office protocol, patient can hold Xarelto for 1-2 days prior to procedure.

## 2021-11-05 NOTE — Telephone Encounter (Signed)
° °  Pre-operative Risk Assessment    Patient Name: Maurice Golden  DOB: 23-Feb-1961 MRN: [id number]      Request for Surgical Clearance    Procedure:   Colonoscopy  Date of Surgery:  Clearance 01/21/22                                 Surgeon:  Dr Marca Ancona Surgeon's Group or Practice Name:  [facility] Phone number:  [phone] Fax number:  [phone]   Type of Clearance Requested:   - Pharmacy:  Hold Rivaroxaban (Xarelto)     Type of Anesthesia:   Propofol   Additional requests/questions:      SignedErnest Mallick, Santiana Glidden L   11/05/2021, 10:06 AM

## 2021-11-06 NOTE — Telephone Encounter (Signed)
° °  Name: Maurice Golden  DOB: 1961/04/21  MRN: [id number]   Primary Cardiologist: Previously Dr. Rayann Heman  Chart reviewed as part of pre-operative protocol coverage. We have been asked for guidance to hold xarelto for endoscopy. Per our clinical pharmacist:  Patient with diagnosis of Afib on Xarelto for anticoagulation.     Procedure: Colonoscopy Date of procedure: 01/21/22   CHA2DS2-VASc Score = 1  This indicates a 0.6% annual risk of stroke. The patient's score is based upon: CHF History: 0 HTN History: 1 Diabetes History: 0 Stroke History: 0 Vascular Disease History: 0 Age Score: 0 Gender Score: 0   CrCl 140 mL/min Platelet count 304k   Per office protocol, patient can hold Xarelto for 1-2 days prior to procedure.         I will route this recommendation to the requesting party via Epic fax function and remove from pre-op pool. Please call with questions.  Tami Lin Ella Golomb, PA 11/06/2021, 7:45 AM

## 2021-11-13 NOTE — Telephone Encounter (Signed)
Our office has received a duplicate request for clearance. It looks to be that we faxed clearance with recommendations on 11/05/21 to requesting office. I will re-fax today to Dr. Therisa Doyne fax # [phone]

## 2021-12-11 ENCOUNTER — Other Ambulatory Visit: Payer: Self-pay | Admitting: Internal Medicine

## 2021-12-11 NOTE — Telephone Encounter (Signed)
Prescription refill request for Xarelto received.  ?Indication: Afib  ?Last office visit: 09/20/21 (Allred)  ?Weight: 147.9kg ?Age:61 ?Scr: 1.15 (05/15/21 via [facility])  ?CrCl: 139.74ml/min ? ?Appropriate dose and refill sent to requested pharmacy.  ?

## 2022-02-24 ENCOUNTER — Other Ambulatory Visit: Payer: Self-pay | Admitting: Internal Medicine

## 2022-04-23 ENCOUNTER — Ambulatory Visit: Payer: Medicare Other | Admitting: Internal Medicine

## 2022-05-15 ENCOUNTER — Ambulatory Visit (HOSPITAL_BASED_OUTPATIENT_CLINIC_OR_DEPARTMENT_OTHER): Payer: Medicare Other | Admitting: Internal Medicine

## 2022-05-15 ENCOUNTER — Ambulatory Visit (HOSPITAL_BASED_OUTPATIENT_CLINIC_OR_DEPARTMENT_OTHER): Payer: Medicare Other

## 2022-05-15 ENCOUNTER — Encounter (HOSPITAL_BASED_OUTPATIENT_CLINIC_OR_DEPARTMENT_OTHER): Payer: Self-pay | Admitting: Internal Medicine

## 2022-05-15 VITALS — BP 144/82 | HR 61 | Ht 76.0 in | Wt 318.0 lb

## 2022-05-15 DIAGNOSIS — I4819 Other persistent atrial fibrillation: Secondary | ICD-10-CM

## 2022-05-15 DIAGNOSIS — I10 Essential (primary) hypertension: Secondary | ICD-10-CM

## 2022-05-15 NOTE — Progress Notes (Signed)
PCP: Jarrett Soho, PA-C   Primary EP: Dr Merton Border Maurice Golden is a 61 y.o. male who presents today for routine electrophysiology followup.  Since last being seen in our clinic, the patient reports doing very well.  He is disabled, primarily due to chronic knee pain.  He cannot walk 1/4 mile due to pain and weakness.  Cannot climb stairs.  Afib is controlled.  Today, he denies symptoms of palpitations, chest pain, shortness of breath,  lower extremity edema, dizziness, presyncope, or syncope.  The patient is otherwise without complaint today.   Past Medical History:  Diagnosis Date   ANEMIA    PATIENT DENIES   Arthritis    Bradycardia    Hypertension    Obesity    OSA (obstructive sleep apnea)    cpap    Paroxysmal atrial fibrillation [facility])    Past Surgical History:  Procedure Laterality Date   ABLATION  01/26/14   PVI by Dr Johney Frame   APPENDECTOMY     ATRIAL FIBRILLATION ABLATION [name] 01/26/2014   Procedure: ATRIAL FIBRILLATION ABLATION;  Surgeon: Gardiner Rhyme, MD;  [facility]: [facility] CATH LAB;  Service: Cardiovascular;  Laterality: [name];   BILATERAL KNEE ARTHROSCOPY     CARDIOVERSION  04/29/2012   Procedure: CARDIOVERSION;  Surgeon: Vesta Mixer, MD;  Location: [facility] ENDOSCOPY;  Service: Cardiovascular;  Laterality: [name];   CARDIOVERSION [name] 12/21/2013   Procedure: CARDIOVERSION;  Surgeon: Wendall Stade, MD;  Location: [facility] ENDOSCOPY;  Service: Cardiovascular;  Laterality: [name];   CARDIOVERSION [name] 05/30/2014   Procedure: CARDIOVERSION;  Surgeon: Laurey Morale, MD;  Location: [facility] ENDOSCOPY;  Service: Cardiovascular;  Laterality: [name];   CARDIOVERSION [name] 04/26/2015   Procedure: CARDIOVERSION;  Surgeon: [name], MD;  Location: [facility] ENDOSCOPY;  Service: Cardiovascular;  Laterality: [name];   CARDIOVERSION [name] 07/21/2017   Procedure: CARDIOVERSION;  Surgeon: [name], MD;  Location: [facility] ENDOSCOPY;  Service: Cardiovascular;  Laterality: [name];   CATARACT EXTRACTION Right 2012    EYE SURGERY     L eye cataract     bilat   TEE WITHOUT CARDIOVERSION  04/29/2012   Procedure: TRANSESOPHAGEAL ECHOCARDIOGRAM (TEE);  Surgeon: Vesta Mixer, MD;  Location: [facility] ENDOSCOPY;  Service: Cardiovascular;  Laterality: [name];  changed from nish to nahser/dl   TEE WITHOUT CARDIOVERSION [name] 01/25/2014   Procedure: TRANSESOPHAGEAL ECHOCARDIOGRAM (TEE);  Surgeon: [name], MD;  Location: [facility] ENDOSCOPY;  Service: Cardiovascular;  Laterality: [name];   TOTAL KNEE ARTHROPLASTY Right 10/27/2016   Procedure: TOTAL KNEE ARTHROPLASTY;  Surgeon: Jodi Geralds, MD;  Location: [facility] OR;  Service: Orthopedics;  Laterality: Right;   TOTAL KNEE ARTHROPLASTY Left 06/08/2020   Procedure: TOTAL KNEE ARTHROPLASTY;  Surgeon: Jodi Geralds, MD;  Location: [facility] ORS;  Service: Orthopedics;  Laterality: Left;    ROS- all systems are reviewed and negatives except as per HPI above  Current Outpatient Medications  Medication Sig Dispense Refill   acetaminophen (TYLENOL) 500 MG tablet as needed.     amiodarone (PACERONE) 200 MG tablet TAKE 1/2 TABLET BY MOUTH EVERY DAY 45 tablet 3   amLODipine (NORVASC) 5 MG tablet TAKE 1 TABLET (5 MG TOTAL) BY MOUTH DAILY. 90 tablet 1   ELDERBERRY PO Take 2 tablets by mouth daily.     metoprolol succinate (TOPROL XL) 25 MG 24 hr tablet Take 1 tablet (25 mg total) by mouth daily. 90 tablet 3   Multiple Vitamin (MULTIVITAMIN WITH MINERALS) TABS tablet Take 3 tablets by mouth daily.  rivaroxaban (XARELTO) 20 MG TABS tablet TAKE 1 TABLET BY MOUTH DAILY WITH SUPPER. 90 tablet 1   No current facility-administered medications for this visit.    Physical Exam: Vitals:   05/15/22 1513  BP: (!) 144/82  Pulse: 61  Weight: (!) 318 lb (144.2 kg)  Height: 6\' 4"  (1.93 m)    GEN- The patient is well appearing, alert and oriented x 3 today.   Head- normocephalic, atraumatic Eyes-  Sclera clear, conjunctiva pink Ears- hearing intact Oropharynx- clear Lungs- Clear to ausculation  bilaterally, normal work of breathing Heart- Regular rate and rhythm, no murmurs, rubs or gallops, PMI not laterally displaced GI- soft, NT, ND, + BS Extremities- no clubbing, cyanosis, or edema  Wt Readings from Last 3 Encounters:  05/15/22 (!) 318 lb (144.2 kg)  09/20/21 (!) 326 lb (147.9 kg)  01/23/21 (!) 311 lb (141.1 kg)    EKG tracing ordered today is personally reviewed and shows sinus   rhythm 61 bpm, PR 246 msec, nonspecific ST/T changes  Assessment and Plan:  Persistent afib Well controlled with low dose amiodarone No changes today Cmet, TSH/T4 today CXR today  2. HTN Stable No change required today  3. OSA Uses CPAP  4. Obesity Body mass index is 38.71 kg/m. Lifestyle modification advised  5. ED Stable No change required today  6. Abnormal TSH Follows with PCP Repeat today   Risks, benefits and potential toxicities for medications prescribed and/or refilled reviewed with patient today.   AF clinic in 6 months  01/25/21 MD, [facility] 05/15/2022 3:45 PM

## 2022-05-15 NOTE — Patient Instructions (Addendum)
Medication Instructions:  Your physician recommends that you continue on your current medications as directed. Please refer to the Current Medication list given to you today. *If you need a refill on your cardiac medications before your next appointment, please call your pharmacy*  Lab Work: You will have blood work drawn today:  CMET, CBC, TSH, T4  Testing/Procedures: You will have a Chest Xray today PA / LAT  Follow-Up: At [facility], you and your health needs are our priority.  As part of our continuing mission to provide you with exceptional heart care, we have created designated Provider Care Teams.  These Care Teams include your primary Cardiologist (physician) and Advanced Practice Providers (APPs -  Physician Assistants and Nurse Practitioners) who all work together to provide you with the care you need, when you need it.  Your next appointment:   Your physician wants you to follow-up in: 6 MONTHS with the [facility]ib [facility]  You will receive a reminder letter in the mail two months in advance. If you don't receive a letter, please call our office to schedule the follow-up appointment.   Important Information About Sugar

## 2022-05-16 LAB — CBC WITH DIFFERENTIAL/PLATELET
Basophils Absolute: 0 10*3/uL (ref 0.0–0.2)
Basos: 1 %
EOS (ABSOLUTE): 0.1 10*3/uL (ref 0.0–0.4)
Eos: 2 %
Hematocrit: 36.4 % — ABNORMAL LOW (ref 37.5–51.0)
Hemoglobin: 12.6 g/dL — ABNORMAL LOW (ref 13.0–17.7)
Immature Grans (Abs): 0 10*3/uL (ref 0.0–0.1)
Immature Granulocytes: 0 %
Lymphocytes Absolute: 1.9 10*3/uL (ref 0.7–3.1)
Lymphs: 32 %
MCH: 28.8 pg (ref 26.6–33.0)
MCHC: 34.6 g/dL (ref 31.5–35.7)
MCV: 83 fL (ref 79–97)
Monocytes Absolute: 0.6 10*3/uL (ref 0.1–0.9)
Monocytes: 10 %
Neutrophils Absolute: 3.4 10*3/uL (ref 1.4–7.0)
Neutrophils: 55 %
Platelets: 287 10*3/uL (ref 150–450)
RBC: 4.37 x10E6/uL (ref 4.14–5.80)
RDW: 13.1 % (ref 11.6–15.4)
WBC: 6 10*3/uL (ref 3.4–10.8)

## 2022-05-16 LAB — COMPREHENSIVE METABOLIC PANEL
ALT: 17 IU/L (ref 0–44)
AST: 16 IU/L (ref 0–40)
Albumin/Globulin Ratio: 1.4 (ref 1.2–2.2)
Albumin: 4.3 g/dL (ref 3.9–4.9)
Alkaline Phosphatase: 74 IU/L (ref 44–121)
BUN/Creatinine Ratio: 16 (ref 10–24)
BUN: 17 mg/dL (ref 8–27)
Bilirubin Total: 0.7 mg/dL (ref 0.0–1.2)
CO2: 26 mmol/L (ref 20–29)
Calcium: 9.4 mg/dL (ref 8.6–10.2)
Chloride: 101 mmol/L (ref 96–106)
Creatinine, Ser: 1.07 mg/dL (ref 0.76–1.27)
Globulin, Total: 3.1 g/dL (ref 1.5–4.5)
Glucose: 99 mg/dL (ref 70–99)
Potassium: 4 mmol/L (ref 3.5–5.2)
Sodium: 139 mmol/L (ref 134–144)
Total Protein: 7.4 g/dL (ref 6.0–8.5)
eGFR: 79 mL/min/{1.73_m2} (ref >59)

## 2022-05-16 LAB — TSH: TSH: 0.62 u[IU]/mL (ref 0.450–4.500)

## 2022-05-16 LAB — T4: T4, Total: 8.5 ug/dL (ref 4.5–12.0)

## 2022-05-23 ENCOUNTER — Telehealth: Payer: Self-pay

## 2022-05-23 NOTE — Telephone Encounter (Signed)
Result reviewed.  Please make sure patient has access to result through mychart or receives notification.  Pt called earlier this morning; I will notify him to let him know his lab results are available through MyChart.  No follow up required.

## 2022-05-23 NOTE — Telephone Encounter (Signed)
Result reviewed.  Please make sure patient has access to result through mychart or receives notification.  Per Dr. Shela Commons. Allred  Pt called this morning and the results of his DG Chest 2 view were shared with patient.  Pt understood results, and will continue with POC as ordered by Dr. Johney Frame.  No follow up required at this time.

## 2022-07-04 ENCOUNTER — Other Ambulatory Visit: Payer: Self-pay | Admitting: Internal Medicine

## 2022-07-04 NOTE — Telephone Encounter (Signed)
Prescription refill request for Xarelto received.   Indication: afib  Last office visit: Allred, 05/15/2022 Weight: 144.2kg Age: 61 yo  Scr: 1.07, 05/15/2022 CrCl: 148 ml/min   Refill sent.

## 2022-09-22 ENCOUNTER — Other Ambulatory Visit: Payer: Self-pay | Admitting: Internal Medicine

## 2022-10-09 ENCOUNTER — Other Ambulatory Visit: Payer: Self-pay | Admitting: Internal Medicine

## 2023-01-14 ENCOUNTER — Other Ambulatory Visit: Payer: Self-pay

## 2023-01-14 MED ORDER — RIVAROXABAN 20 MG PO TABS: 20.0000 mg | ORAL_TABLET | Freq: Every day | ORAL | 1 refills | Status: DC

## 2023-01-14 NOTE — Telephone Encounter (Signed)
Received fax from [place] for refill on Xarelto.  Pt last saw Dr Rayann Heman 05/15/22, last labs 05/15/22 Creat 1.07, age 62, weight 144.2kg, CrCl 146, based on CrCl pt is on appropriate dosage of Xarelto 20mg  QD for afib.  Will refill rx. Pt was due to follow-up in afib clinic in 6 months, msg sent to schedulers pt needs OV.

## 2023-01-20 LAB — OPHTHALMOLOGY REPORT-SCANNED

## 2023-01-21 ENCOUNTER — Encounter: Payer: Self-pay | Admitting: Family Medicine

## 2023-01-21 ENCOUNTER — Ambulatory Visit: Payer: Medicare Other | Admitting: Family Medicine

## 2023-01-21 VITALS — BP 150/88 | HR 58 | Temp 99.2°F | Ht 76.0 in | Wt 316.2 lb

## 2023-01-21 DIAGNOSIS — I10 Essential (primary) hypertension: Secondary | ICD-10-CM

## 2023-01-21 DIAGNOSIS — F650 Fetishism: Secondary | ICD-10-CM | POA: Diagnosis not present

## 2023-01-21 DIAGNOSIS — G4733 Obstructive sleep apnea (adult) (pediatric): Secondary | ICD-10-CM | POA: Diagnosis not present

## 2023-01-21 NOTE — Progress Notes (Signed)
New Patient Office Visit  Subjective:  Patient ID: Maurice Golden, male    DOB: 11-19-1960  Age: 62 y.o. MRN: [id number]  CC:  Chief Complaint  Patient presents with   Establish Care    Initial visit to establish care with pcp, haven't had a primary in years     HPI-5 bro 3 sis Maurice Golden presents for new patient. HYPERTENSION Always playing w/ears. Not itch. No pain. More after shower. Occasional "caked up stuff" some allergies March-June.  Right eye watering-saw opt,  since child, always rubbing eyebrows as well OSA-wearing CPAP Knees-both replaced.  walks .  Disabilty Atrial fibrillation/hypertension-disabilty.  Low stress.  Blood pressure 127/85 at home  Past Medical History:  Diagnosis Date   Allergy    ANEMIA    PATIENT DENIES   Arthritis    Bradycardia    Cataract    Hypertension    Obesity    OSA (obstructive sleep apnea)    cpap    Paroxysmal atrial fibrillation     Past Surgical History:  Procedure Laterality Date   ABLATION  01/26/2014   PVI by Dr Maurice Golden   APPENDECTOMY     ATRIAL FIBRILLATION ABLATION [name] 01/26/2014   Procedure: ATRIAL FIBRILLATION ABLATION;  Surgeon: Maurice Rhyme, MD;  Location: [facility] CATH LAB;  Service: Cardiovascular;  Laterality: [name];   BILATERAL KNEE ARTHROSCOPY     CARDIOVERSION  04/29/2012   Procedure: CARDIOVERSION;  Surgeon: Maurice Mixer, MD;  Location: [facility] ENDOSCOPY;  Service: Cardiovascular;  Laterality: [name];   CARDIOVERSION [name] 12/21/2013   Procedure: CARDIOVERSION;  Surgeon: Maurice Stade, MD;  Location: [facility] ENDOSCOPY;  Service: Cardiovascular;  Laterality: [name];   CARDIOVERSION [name] 05/30/2014   Procedure: CARDIOVERSION;  Surgeon: Maurice Morale, MD;  Location: [facility] ENDOSCOPY;  Service: Cardiovascular;  Laterality: [name];   CARDIOVERSION [name] 04/26/2015   Procedure: CARDIOVERSION;  Surgeon: Maurice Nose, MD;  Location: [facility] ENDOSCOPY;  Service: Cardiovascular;  Laterality: [name];   CARDIOVERSION [name] 07/21/2017    Procedure: CARDIOVERSION;  Surgeon: Maurice Nose, MD;  Location: [facility] ENDOSCOPY;  Service: Cardiovascular;  Laterality: [name];   CATARACT EXTRACTION Right 2012   EYE SURGERY     JOINT REPLACEMENT     L eye cataract     bilat   TEE WITHOUT CARDIOVERSION  04/29/2012   Procedure: TRANSESOPHAGEAL ECHOCARDIOGRAM (TEE);  Surgeon: Maurice Mixer, MD;  Location: [facility] ENDOSCOPY;  Service: Cardiovascular;  Laterality: [name];  changed from nish to nahser/dl   TEE WITHOUT CARDIOVERSION [name] 01/25/2014   Procedure: TRANSESOPHAGEAL ECHOCARDIOGRAM (TEE);  Surgeon: Maurice Nose, MD;  Location: [facility] ENDOSCOPY;  Service: Cardiovascular;  Laterality: [name];   TOTAL KNEE ARTHROPLASTY Right 10/27/2016   Procedure: TOTAL KNEE ARTHROPLASTY;  Surgeon: Maurice Geralds, MD;  Location: [facility] OR;  Service: Orthopedics;  Laterality: Right;   TOTAL KNEE ARTHROPLASTY Left 06/08/2020   Procedure: TOTAL KNEE ARTHROPLASTY;  Surgeon: Maurice Geralds, MD;  Location: [facility] ORS;  Service: Orthopedics;  Laterality: Left;    Family History  Problem Relation Age of Onset   Sarcoidosis Mother    Kidney disease Mother    Arthritis Mother    Diabetes Mother    Hypertension Mother    Hypertension Father    Sarcoidosis Brother     Social History   Socioeconomic History   Marital status: Single    Spouse name: Not on file   Number of children: 0   Years of education: Not on file   Highest education  level: Not on file  Occupational History   Occupation: retired  Tobacco Use   Smoking status: Never   Smokeless tobacco: Never  Vaping Use   Vaping Use: Never used  Substance and Sexual Activity   Alcohol use: No   Drug use: No   Sexual activity: Yes  Other Topics Concern   Not on file  Social History Narrative   [place] in [place].  Retired/ disabled from [employer].   Social Determinants of Health   Financial Resource Strain: Not on file  Food Insecurity: Not on file  Transportation Needs: Not on file  Physical  Activity: Not on file  Stress: Not on file  Social Connections: Not on file  Intimate Partner Violence: Not on file    ROS  ROS: Gen: no fever, chills  Skin: no rash, itching ENT: no ear pain, ear drainage, nasal congestion, rhinorrhea, sinus pressure, sore throat Eyes: no blurry vision, double vision Resp: no cough, wheeze,SOB CV: no CP, palpitations, LE edema,  GI: no heartburn, n/v/d/c, abd pain GU: no dysuria, urgency, frequency, hematuria MSK: no joint pain, myalgias, back pain Neuro: no dizziness, headache, weakness, vertigo Psych: no depression, anxiety, insomnia, SI   Objective:   Today's Vitals: BP (!) 150/88 (BP Location: Left Arm, Patient Position: Sitting, Cuff Size: Large)   Pulse (!) 58   Temp 99.2 F (37.3 C) (Temporal)   Ht 6\' 4"  (1.93 m)   Wt (!) 316 lb 4 oz (143.5 kg)   SpO2 97%   BMI 38.50 kg/m   Physical Exam  Gen: WDWN NAD OAAM HEENT: NCAT, conjunctiva not injected, sclera nonicteric TM WNL B,.  No eczema.  Behind left ear-a little moist and small crack.  OP moist, no exudates  NECK:  supple, no thyromegaly, no nodes, no carotid bruits CARDIAC: RRR, S1S2+, no murmur. DP 2+B LUNGS: CTAB. No wheezes ABDOMEN:  BS+, soft, NTND, No HSM, no masses EXT:  no edema MSK: no gross abnormalities.  NEURO: A&O x3.  CN II-XII intact.  PSYCH: normal mood. Good eye contact   Assessment & Plan:   Problem List Items Addressed This Visit       Cardiovascular and Mediastinum   Essential hypertension - Primary    Chronic.  Elevated today.  Controlled outpt.  Managed by Card        Respiratory   Obstructive sleep apnea    Chronic.  Compliant w/CPAP per pt        Other   Fetish disorder involving body part    Chronic.  Nothing medical.  Patient not wanting medications.  Reassurance.  Keep dry w/towel after shower       Outpatient Encounter Medications as of 01/21/2023  Medication Sig   acetaminophen (TYLENOL) 500 MG tablet as needed.   amiodarone  (PACERONE) 200 MG tablet TAKE 1/2 TABLET BY MOUTH EVERY DAY   amLODipine (NORVASC) 5 MG tablet TAKE 1 TABLET (5 MG TOTAL) BY MOUTH DAILY.   ELDERBERRY PO Take 2 tablets by mouth daily.   metoprolol succinate (TOPROL-XL) 25 MG 24 hr tablet TAKE 1 TABLET (25 MG TOTAL) BY MOUTH DAILY.   Multiple Vitamin (MULTIVITAMIN WITH MINERALS) TABS tablet Take 3 tablets by mouth daily.   rivaroxaban (XARELTO) 20 MG TABS tablet Take 1 tablet (20 mg total) by mouth daily with supper.   cyclobenzaprine (FLEXERIL) 10 MG tablet Take 10 mg by mouth 3 (three) times daily as needed. (Patient not taking: Reported on 01/21/2023)   No facility-administered encounter  medications on file as of 01/21/2023.    Follow-up: Return in about 4 months (around 05/23/2023) for chronic follow-up.   Angelena Sole, MD

## 2023-01-21 NOTE — Patient Instructions (Addendum)
Welcome to [facility]! It was a pleasure meeting you today.  As discussed, Please schedule a 4 month follow up visit today.  Dry ears and behind after shower  PLEASE NOTE:  If you had any LAB tests please let us know if you have not heard back within a few days. You may see your results on MyChart before we have a chance to review them but we will give you a call once they are reviewed by [place]. If we ordered any REFERRALS today, please let us know if you have not heard from their office within the next week.  Let us know through MyChart if you are needing REFILLS, or have your pharmacy send [place] the request. You can also use MyChart to communicate with me or any office staff.  Please try these tips to maintain a healthy lifestyle:  Eat most of your calories during the day when you are active. Eliminate processed foods including packaged sweets (pies, cakes, cookies), reduce intake of potatoes, white bread, white pasta, and white rice. Look for whole grain options, oat flour or almond flour.  Each meal should contain half fruits/vegetables, one quarter protein, and one quarter carbs (no bigger than a computer mouse).  Cut down on sweet beverages. This includes juice, soda, and sweet tea. Also watch fruit intake, though this is a healthier sweet option, it still contains natural sugar! Limit to 3 servings daily.  Drink at least 1 glass of water with each meal and aim for at least 8 glasses per day  Exercise at least 150 minutes every week.

## 2023-01-21 NOTE — Assessment & Plan Note (Signed)
Chronic.  Compliant w/CPAP per pt

## 2023-01-21 NOTE — Assessment & Plan Note (Signed)
Chronic.  Nothing medical.  Patient not wanting medications.  Reassurance.  Keep dry w/towel after shower

## 2023-01-21 NOTE — Assessment & Plan Note (Signed)
Chronic.  Elevated today.  Controlled outpt.  Managed by Card

## 2023-05-25 ENCOUNTER — Ambulatory Visit: Payer: Medicare Other | Admitting: Family Medicine

## 2023-06-01 ENCOUNTER — Ambulatory Visit: Payer: Medicare Other | Admitting: Family Medicine

## 2023-06-01 ENCOUNTER — Encounter: Payer: Self-pay | Admitting: Family Medicine

## 2023-06-01 VITALS — BP 138/84 | HR 63 | Temp 98.6°F | Ht 76.0 in | Wt 309.2 lb

## 2023-06-01 DIAGNOSIS — Z79899 Other long term (current) drug therapy: Secondary | ICD-10-CM

## 2023-06-01 DIAGNOSIS — I10 Essential (primary) hypertension: Secondary | ICD-10-CM | POA: Diagnosis not present

## 2023-06-01 DIAGNOSIS — I480 Paroxysmal atrial fibrillation: Secondary | ICD-10-CM | POA: Diagnosis not present

## 2023-06-01 DIAGNOSIS — Z125 Encounter for screening for malignant neoplasm of prostate: Secondary | ICD-10-CM | POA: Diagnosis not present

## 2023-06-01 DIAGNOSIS — L989 Disorder of the skin and subcutaneous tissue, unspecified: Secondary | ICD-10-CM

## 2023-06-01 DIAGNOSIS — G4733 Obstructive sleep apnea (adult) (pediatric): Secondary | ICD-10-CM

## 2023-06-01 DIAGNOSIS — I4819 Other persistent atrial fibrillation: Secondary | ICD-10-CM

## 2023-06-01 NOTE — Progress Notes (Signed)
Subjective:     Patient ID: Maurice Golden, male    DOB: 1961/09/08, 62 y.o.   MRN: [id number]  Chief Complaint  Patient presents with   4 month follow-up   Medical Management of Chronic Issues    HPI  OSA - Manages with CPAP.   A-fib - Pt is on amlodipine 5 mg, toprol-XL 25 mg, xarelto 20 mg, and amiodarone 200 mg. Bp's running 125-140/79-86 at home. Drinks plenty water throughout the day, drinks water with cucumber and lemon. No ha/dizziness/cp/palp/edema/cough/sob.  Lump on Foot - He reports a lump on his medial left foot. States it comes and goes intermittently. Denies any associated pain. No hx of gout. He is interested in a referral to podiatry.   His nervous twitch improved. State he has not done it in a while.  Was previously established with [facility] on [address].   Health Maintenance Due  Topic Date Due   Medicare Annual Wellness (AWV)  Never done   DTaP/Tdap/Td (1 - Tdap) Never done   Zoster Vaccines- Shingrix (1 of 2) Never done    Past Medical History:  Diagnosis Date   Allergy    ANEMIA    PATIENT DENIES   Arthritis    Bradycardia    Cataract    Hypertension    Obesity    OSA (obstructive sleep apnea)    cpap    Paroxysmal atrial fibrillation [facility])     Past Surgical History:  Procedure Laterality Date   ABLATION  01/26/2014   PVI by Dr Johney Frame   APPENDECTOMY     ATRIAL FIBRILLATION ABLATION [name] 01/26/2014   Procedure: ATRIAL FIBRILLATION ABLATION;  Surgeon: Gardiner Rhyme, MD;  [facility] C[facility] LAB;  Service: Cardiovascular;  Laterality: [name];   BILATERAL KNEE ARTHROSCOPY     CARDIOVERSION  04/29/2012   Procedure: CARDIOVERSION;  Surgeon: Vesta Mixer, MD;  Location: [facility] ENDOSCOPY;  Service: Cardiovascular;  Laterality: [name];   CARDIOVERSION [name] 12/21/2013   Procedure: CARDIOVERSION;  Surgeon: Wendall Stade, MD;  Location: [facility] ENDOSCOPY;  Service: Cardiovascular;  Laterality: [name];   CARDIOVERSION [name] 05/30/2014   Procedure:  CARDIOVERSION;  Surgeon: Laurey Morale, MD;  Location: [facility] ENDOSCOPY;  Service: Cardiovascular;  Laterality: [name];   CARDIOVERSION [name] 04/26/2015   Procedure: CARDIOVERSION;  Surgeon: [name], MD;  Location: [facility] ENDOSCOPY;  Service: Cardiovascular;  Laterality: [name];   CARDIOVERSION [name] 07/21/2017   Procedure: CARDIOVERSION;  Surgeon: [name], MD;  Location: [facility] ENDOSCOPY;  Service: Cardiovascular;  Laterality: [name];   CATARACT EXTRACTION Right 2012   EYE SURGERY     JOINT REPLACEMENT     L eye cataract     bilat   TEE WITHOUT CARDIOVERSION  04/29/2012   Procedure: TRANSESOPHAGEAL ECHOCARDIOGRAM (TEE);  Surgeon: Vesta Mixer, MD;  Location: [facility] ENDOSCOPY;  Service: Cardiovascular;  Laterality: [name];  changed from nish to nahser/dl   TEE WITHOUT CARDIOVERSION [name] 01/25/2014   Procedure: TRANSESOPHAGEAL ECHOCARDIOGRAM (TEE);  Surgeon: [name], MD;  Location: [facility] ENDOSCOPY;  Service: Cardiovascular;  Laterality: [name];   TOTAL KNEE ARTHROPLASTY Right 10/27/2016   Procedure: TOTAL KNEE ARTHROPLASTY;  Surgeon: Jodi Geralds, MD;  Location: [facility] OR;  Service: Orthopedics;  Laterality: Right;   TOTAL KNEE ARTHROPLASTY Left 06/08/2020   Procedure: TOTAL KNEE ARTHROPLASTY;  Surgeon: Jodi Geralds, MD;  Location: [facility] ORS;  Service: Orthopedics;  Laterality: Left;     Current Outpatient Medications:    acetaminophen (TYLENOL) 500 MG tablet, as  needed., Disp: , Rfl:    amiodarone (PACERONE) 200 MG tablet, TAKE 1/2 TABLET BY MOUTH EVERY DAY, Disp: 45 tablet, Rfl: 2   amLODipine (NORVASC) 5 MG tablet, TAKE 1 TABLET (5 MG TOTAL) BY MOUTH DAILY., Disp: 90 tablet, Rfl: 2   ELDERBERRY PO, Take 2 tablets by mouth daily., Disp: , Rfl:    metoprolol succinate (TOPROL-XL) 25 MG 24 hr tablet, TAKE 1 TABLET (25 MG TOTAL) BY MOUTH DAILY., Disp: 90 tablet, Rfl: 2   Multiple Vitamin (MULTIVITAMIN WITH MINERALS) TABS tablet, Take 3 tablets by mouth daily., Disp: , Rfl:    rivaroxaban (XARELTO) 20 MG  TABS tablet, Take 1 tablet (20 mg total) by mouth daily with supper., Disp: 90 tablet, Rfl: 1  Allergies  Allergen Reactions   Hydrocodone Itching and Other (See Comments)    Severe itching   Other Itching and Other (See Comments)    All narcotics - Severe itching    ROS neg/noncontributory except as noted HPI/below      Objective:     BP 138/84 (BP Location: Left Arm, Patient Position: Sitting)   Pulse 63   Temp 98.6 F (37 C) (Temporal)   Ht 6\' 4"  (1.93 m)   Wt (!) 309 lb 3.2 oz (140.3 kg)   SpO2 95%   BMI 37.64 kg/m  Wt Readings from Last 3 Encounters:  06/01/23 (!) 309 lb 3.2 oz (140.3 kg)  01/21/23 (!) 316 lb 4 oz (143.5 kg)  05/15/22 (!) 318 lb (144.2 kg)    Physical Exam   Gen: WDWN NAD HEENT: NCAT, conjunctiva not injected, sclera nonicteric NECK:  supple, no thyromegaly, no nodes, no carotid bruits CARDIAC: RRR, S1S2+, no murmur. DP 2+B LUNGS: CTAB. No wheezes ABDOMEN:  BS+, soft, NTND, No HSM, no masses EXT:  no edema MSK: no gross abnormalities.  NEURO: A&O x3.  CN II-XII intact.  PSYCH: normal mood. Good eye contact  +2.5 cm of semi-firm lump top of left first mtp.NT    Assessment & Plan:  Essential hypertension Assessment & Plan: Chronic. Controlled.  Continue amlodipine 5mg  daily  Orders: -     Lipid panel -     Comprehensive metabolic panel -     CBC with Differential/Platelet -     Hemoglobin A1c -     TSH  Paroxysmal atrial fibrillation (HCC) -     Lipid panel -     Comprehensive metabolic panel -     CBC with Differential/Platelet -     Hemoglobin A1c -     TSH  High risk medication use -     Comprehensive metabolic panel -     CBC with Differential/Platelet -     TSH  Screening for prostate cancer -     PSA  Foot lesion -     Ambulatory referral to Podiatry  Obstructive sleep apnea Assessment & Plan: Chronic.  Controlled w/CPAP   Persistent atrial fibrillation (HCC) Assessment & Plan: Chronic.  Well controlled on  amiodorone 200mg , metoprolol XL 25mg  and on xarelto 20mg  daily.  Managed by Card.      Return in about 6 months (around 12/02/2023) for annual physical.  I,Rachel Rivera,acting as a scribe for Angelena Sole, MD.,have documented all relevant documentation on the behalf of Angelena Sole, MD,as directed by  Angelena Sole, MD while in the presence of Angelena Sole, MD.  I, Angelena Sole, MD, have reviewed all documentation for this visit. The documentation on 06/01/23 for  the exam, diagnosis, procedures, and orders are all accurate and complete.   Angelena Sole, MD

## 2023-06-01 NOTE — Assessment & Plan Note (Signed)
Chronic.  Well controlled on amiodorone 200mg , metoprolol XL 25mg  and on xarelto 20mg  daily.  Managed by Card.

## 2023-06-01 NOTE — Assessment & Plan Note (Signed)
Chronic.  Controlled w/CPAP

## 2023-06-01 NOTE — Patient Instructions (Signed)
It was very nice to see you today!  Get shingrix at pharmacy.   Consider Tdap at pharmacy   PLEASE NOTE:  If you had any lab tests please let us know if you have not heard back within a few days. You may see your results on MyChart before we have a chance to review them but we will give you a call once they are reviewed by [place]. If we ordered any referrals today, please let us know if you have not heard from their office within the next week.   Please try these tips to maintain a healthy lifestyle:  Eat most of your calories during the day when you are active. Eliminate processed foods including packaged sweets (pies, cakes, cookies), reduce intake of potatoes, white bread, white pasta, and white rice. Look for whole grain options, oat flour or almond flour.  Each meal should contain half fruits/vegetables, one quarter protein, and one quarter carbs (no bigger than a computer mouse).  Cut down on sweet beverages. This includes juice, soda, and sweet tea. Also watch fruit intake, though this is a healthier sweet option, it still contains natural sugar! Limit to 3 servings daily.  Drink at least 1 glass of water with each meal and aim for at least 8 glasses per day  Exercise at least 150 minutes every week.

## 2023-06-01 NOTE — Assessment & Plan Note (Signed)
Chronic. Controlled.  Continue amlodipine 5mg  daily

## 2023-06-02 LAB — COMPREHENSIVE METABOLIC PANEL
ALT: 17 U/L (ref 0–53)
AST: 17 U/L (ref 0–37)
Albumin: 4 g/dL (ref 3.5–5.2)
Alkaline Phosphatase: 68 U/L (ref 39–117)
BUN: 17 mg/dL (ref 6–23)
CO2: 28 mEq/L (ref 19–32)
Calcium: 9.6 mg/dL (ref 8.4–10.5)
Chloride: 103 meq/L (ref 96–112)
Creatinine, Ser: 0.99 mg/dL (ref 0.40–1.50)
GFR: 81.58 mL/min (ref >60.00)
Glucose, Bld: 136 mg/dL — ABNORMAL HIGH (ref 70–99)
Potassium: 3.9 meq/L (ref 3.5–5.1)
Sodium: 139 meq/L (ref 135–145)
Total Bilirubin: 0.6 mg/dL (ref 0.2–1.2)
Total Protein: 7.5 g/dL (ref 6.0–8.3)

## 2023-06-02 LAB — CBC WITH DIFFERENTIAL/PLATELET
Basophils Absolute: 0.1 10*3/uL (ref 0.0–0.1)
Basophils Relative: 1.1 % (ref 0.0–3.0)
Eosinophils Absolute: 0.1 10*3/uL (ref 0.0–0.7)
Eosinophils Relative: 1.9 % (ref 0.0–5.0)
HCT: 36.3 % — ABNORMAL LOW (ref 39.0–52.0)
Hemoglobin: 11.9 g/dL — ABNORMAL LOW (ref 13.0–17.0)
Lymphocytes Relative: 33 % (ref 12.0–46.0)
Lymphs Abs: 1.7 10*3/uL (ref 0.7–4.0)
MCHC: 32.9 g/dL (ref 30.0–36.0)
MCV: 87.3 fl (ref 78.0–100.0)
Monocytes Absolute: 0.5 10*3/uL (ref 0.1–1.0)
Monocytes Relative: 9.7 % (ref 3.0–12.0)
Neutro Abs: 2.8 10*3/uL (ref 1.4–7.7)
Neutrophils Relative %: 54.3 % (ref 43.0–77.0)
Platelets: 368 10*3/uL (ref 150.0–400.0)
RBC: 4.15 Mil/uL — ABNORMAL LOW (ref 4.22–5.81)
RDW: 14.2 % (ref 11.5–15.5)
WBC: 5.2 10*3/uL (ref 4.0–10.5)

## 2023-06-02 LAB — TSH: TSH: 0.36 u[IU]/mL (ref 0.35–5.50)

## 2023-06-02 LAB — LIPID PANEL
Cholesterol: 167 mg/dL (ref 0–200)
HDL: 36.9 mg/dL — ABNORMAL LOW (ref >39.00)
LDL Cholesterol: 108 mg/dL — ABNORMAL HIGH (ref 0–99)
NonHDL: 129.92
Total CHOL/HDL Ratio: 5
Triglycerides: 111 mg/dL (ref 0.0–149.0)
VLDL: 22.2 mg/dL (ref 0.0–40.0)

## 2023-06-02 LAB — HEMOGLOBIN A1C: Hgb A1c MFr Bld: 7.4 % — ABNORMAL HIGH (ref 4.6–6.5)

## 2023-06-02 LAB — PSA: PSA: 5.84 ng/mL — ABNORMAL HIGH (ref 0.10–4.00)

## 2023-06-09 NOTE — Progress Notes (Signed)
A lot of abn labs.  No panic.  Sch appt to discuss.  Refer urol for elevated psa

## 2023-06-10 ENCOUNTER — Other Ambulatory Visit: Payer: Self-pay | Admitting: *Deleted

## 2023-06-10 DIAGNOSIS — R972 Elevated prostate specific antigen [PSA]: Secondary | ICD-10-CM

## 2023-06-25 ENCOUNTER — Ambulatory Visit: Payer: Medicare Other | Admitting: Podiatry

## 2023-06-25 ENCOUNTER — Encounter: Payer: Self-pay | Admitting: Podiatry

## 2023-06-25 ENCOUNTER — Ambulatory Visit (INDEPENDENT_AMBULATORY_CARE_PROVIDER_SITE_OTHER): Payer: Medicare Other

## 2023-06-25 DIAGNOSIS — M21619 Bunion of unspecified foot: Secondary | ICD-10-CM

## 2023-06-25 DIAGNOSIS — M21612 Bunion of left foot: Secondary | ICD-10-CM | POA: Diagnosis not present

## 2023-06-25 DIAGNOSIS — M2022 Hallux rigidus, left foot: Secondary | ICD-10-CM

## 2023-06-25 NOTE — Patient Instructions (Signed)

## 2023-06-25 NOTE — Progress Notes (Signed)
Subjective:   Patient ID: Maurice Golden, male   DOB: 62 y.o.   MRN: [id number]   HPI Chief Complaint  Patient presents with   Bunions    Large bunion of left foot great toe   The patient, with a history of trauma to the left foot, presents with a 25-year history of discomfort in the left big toe joint. The discomfort has recently become more bothersome as the joint has become stiff and is not bending as needed. The patient describes the discomfort as not painful but annoying. The patient reports no previous treatment for this issue. The patient also reports a history of trauma to the foot from running into a concrete wall while playing baseball, after which they self-treated with ice and continued their activities. He wants the big bump on the top of the toe gone.    Review of Systems  All other systems reviewed and are negative.  Past Medical History:  Diagnosis Date   Allergy    ANEMIA    PATIENT DENIES   Arthritis    Bradycardia    Cataract    Hypertension    Obesity    OSA (obstructive sleep apnea)    cpap    Paroxysmal atrial fibrillation [facility])     Past Surgical History:  Procedure Laterality Date   ABLATION  01/26/2014   PVI by Dr Johney Frame   APPENDECTOMY     ATRIAL FIBRILLATION ABLATION [name] 01/26/2014   Procedure: ATRIAL FIBRILLATION ABLATION;  Surgeon: Gardiner Rhyme, MD;  Location: [facility] CATH LAB;  Service: Cardiovascular;  Laterality: [name];   BILATERAL KNEE ARTHROSCOPY     CARDIOVERSION  04/29/2012   Procedure: CARDIOVERSION;  Surgeon: Vesta Mixer, MD;  Location: [facility] ENDOSCOPY;  Service: Cardiovascular;  Laterality: [name];   CARDIOVERSION [name] 12/21/2013   Procedure: CARDIOVERSION;  Surgeon: Wendall Stade, MD;  Location: [facility] ENDOSCOPY;  Service: Cardiovascular;  Laterality: [name];   CARDIOVERSION [name] 05/30/2014   Procedure: CARDIOVERSION;  Surgeon: Laurey Morale, MD;  Location: [facility] ENDOSCOPY;  Service: Cardiovascular;  Laterality: [name];   CARDIOVERSION [name] 04/26/2015    Procedure: CARDIOVERSION;  Surgeon: [name], MD;  Location: [facility] ENDOSCOPY;  Service: Cardiovascular;  Laterality: [name];   CARDIOVERSION [name] 07/21/2017   Procedure: CARDIOVERSION;  Surgeon: [name], MD;  Location: [facility] ENDOSCOPY;  Service: Cardiovascular;  Laterality: [name];   CATARACT EXTRACTION Right 2012   EYE SURGERY     JOINT REPLACEMENT     L eye cataract     bilat   TEE WITHOUT CARDIOVERSION  04/29/2012   Procedure: TRANSESOPHAGEAL ECHOCARDIOGRAM (TEE);  Surgeon: Vesta Mixer, MD;  Location: [facility] ENDOSCOPY;  Service: Cardiovascular;  Laterality: [name];  changed from nish to nahser/dl   TEE WITHOUT CARDIOVERSION [name] 01/25/2014   Procedure: TRANSESOPHAGEAL ECHOCARDIOGRAM (TEE);  Surgeon: [name], MD;  Location: [facility] ENDOSCOPY;  Service: Cardiovascular;  Laterality: [name];   TOTAL KNEE ARTHROPLASTY Right 10/27/2016   Procedure: TOTAL KNEE ARTHROPLASTY;  Surgeon: Jodi Geralds, MD;  Location: [facility] OR;  Service: Orthopedics;  Laterality: Right;   TOTAL KNEE ARTHROPLASTY Left 06/08/2020   Procedure: TOTAL KNEE ARTHROPLASTY;  Surgeon: Jodi Geralds, MD;  Location: [facility] ORS;  Service: Orthopedics;  Laterality: Left;     Current Outpatient Medications:    acetaminophen (TYLENOL) 500 MG tablet, as needed., Disp: , Rfl:    amiodarone (PACERONE) 200 MG tablet, TAKE 1/2 TABLET BY MOUTH EVERY DAY, Disp: 45 tablet, Rfl: 2   amLODipine (NORVASC) 5 MG  tablet, TAKE 1 TABLET (5 MG TOTAL) BY MOUTH DAILY., Disp: 90 tablet, Rfl: 2   ELDERBERRY PO, Take 2 tablets by mouth daily., Disp: , Rfl:    metoprolol succinate (TOPROL-XL) 25 MG 24 hr tablet, TAKE 1 TABLET (25 MG TOTAL) BY MOUTH DAILY., Disp: 90 tablet, Rfl: 2   Multiple Vitamin (MULTIVITAMIN WITH MINERALS) TABS tablet, Take 3 tablets by mouth daily., Disp: , Rfl:    rivaroxaban (XARELTO) 20 MG TABS tablet, Take 1 tablet (20 mg total) by mouth daily with supper., Disp: 90 tablet, Rfl: 1  Allergies  Allergen Reactions   Hydrocodone  Itching and Other (See Comments)    Severe itching   Other Itching and Other (See Comments)    All narcotics - Severe itching            Objective:  Physical Exam  General: AAO x3, NAD  Dermatological: Skin is warm, dry and supple bilateral.  There are no open sores, no preulcerative lesions, no rash or signs of infection present.  Vascular: Dorsalis Pedis artery and Posterior Tibial artery pedal pulses are 2/4 bilateral with immedate capillary fill time.  There is no pain with calf compression, swelling, warmth, erythema.   Neruologic: Grossly intact via light touch bilateral.  Musculoskeletal: Large prominence noted along the dorsal 1st MTPJ with substantial decrease in ROM of the 1st MTPJ. He gets pain/discomfort on the first MPJ.  There is also hallux abductus and the second toe started to overlap the hallux.  There is no other areas of pinpoint tenderness noted today.  Gait: Unassisted, Nonantalgic.       Assessment:   Hallux rigidus left foot first MPJ     Plan:  Left Foot Arthritis  X-rays obtained reviewed.  3 views of the foot were obtained.  Significant spurring present at the dorsal first MPJ and arthritic changes present the first digit.  No evidence of acute fracture.  Contracture noted at the hallux IPJ as well.  Severe arthritis in the metatarsophalangeal joint with significant bone spur formation causing discomfort and limited mobility. No acute pain on palpation. History of trauma to the foot. -Plan for surgical intervention with joint fusion to help alleviate discomfort.  We discussed that this is going to make him not have any range of motion to the MPJ but he will likely still be able to move the hallux IPJ.  However we discussed that pulm at this joint as well.  After discussion regards to the conservative as well as surgical options he was to proceed with surgery. -Post-operative recovery plan discussed with the patient. Full recovery expected in 6 months  to a year. -The incision placement as well as the postoperative course was discussed with the patient. I discussed risks of the surgery which include, but not limited to, infection, bleeding, pain, swelling, need for further surgery, delayed or nonhealing, painful or ugly scar, numbness or sensation changes, over/under correction, recurrence, transfer lesions, further deformity, hardware failure, DVT/PE, loss of toe/foot. Patient understands these risks and wishes to proceed with surgery. The surgical consent was reviewed with the patient all 3 pages were signed. No promises or guarantees were given to the outcome of the procedure. All questions were answered to the best of my ability. Before the surgery the patient was encouraged to call the office if there is any further questions. The surgery will be performed at the [facility] on an outpatient basis.  Vivi Barrack DPM

## 2023-07-02 ENCOUNTER — Telehealth: Payer: Self-pay | Admitting: Family Medicine

## 2023-07-02 ENCOUNTER — Other Ambulatory Visit: Payer: Self-pay | Admitting: Family

## 2023-07-02 MED ORDER — AMIODARONE HCL 200 MG PO TABS: 100.0000 mg | ORAL_TABLET | Freq: Every day | ORAL | 2 refills | Status: DC

## 2023-07-02 NOTE — Telephone Encounter (Signed)
PT IS OUT    Prescription Request  07/02/2023  LOV: 06/01/2023  What is the name of the medication or equipment?  amLODipine (NORVASC) 5 MG tablet   amiodarone (PACERONE) 200 MG tablet    Have you contacted your pharmacy to request a refill? Yes   Which pharmacy would you like this sent to? [id number] - [place], [place] - [address] AT [facility] [address] [place] [address] Phone: [phone] Fax: [phone]   Patient notified that their request is being sent to the clinical staff for review and that they should receive a response within 2 business days.   Please advise at Mobile [phone] (mobile)

## 2023-07-03 ENCOUNTER — Other Ambulatory Visit: Payer: Self-pay | Admitting: *Deleted

## 2023-07-03 MED ORDER — AMLODIPINE BESYLATE 5 MG PO TABS: 5.0000 mg | ORAL_TABLET | Freq: Every day | ORAL | 2 refills | Status: DC

## 2023-07-03 NOTE — Telephone Encounter (Signed)
Amlodipine sent to the pharmacy.

## 2023-07-03 NOTE — Telephone Encounter (Signed)
Patient states the amlodipine 5 mg wasn't sent to the pharmacy. He is asking for this to be sent ASAP since he is out.

## 2023-07-06 ENCOUNTER — Other Ambulatory Visit: Payer: Self-pay

## 2023-07-06 MED ORDER — METOPROLOL SUCCINATE ER 25 MG PO TB24: 25.0000 mg | ORAL_TABLET | Freq: Every day | ORAL | 0 refills | Status: DC

## 2023-07-15 ENCOUNTER — Other Ambulatory Visit: Payer: Self-pay | Admitting: Student

## 2023-07-15 NOTE — Telephone Encounter (Signed)
Prescription refill request for Xarelto received.  Indication: AF Last office visit: 05/15/22  Tommie Sams MD Weight: 144.2kg Age: 62 Scr: 0.99 on 06/01/23 CrCl: 157.79  Based on above findings Xarelto 20mg  daily is the appropriate dose.  Refill approved.

## 2023-07-27 NOTE — Progress Notes (Unsigned)
Electrophysiology Office Note:   Date:  07/28/2023  ID:  Maurice Golden, DOB 10-30-1960, MRN [id number]  Primary Cardiologist: None Electrophysiologist: Nobie Putnam, MD      History of Present Illness:   Maurice Golden is a 62 y.o. male with h/o hypertension, obstructive sleep apnea and persistent atrial fibrillation seen today for routine electrophysiology followup.   Since last being seen in our clinic the patient reports doing relatively well.  He denies chest pain, palpitations, dyspnea, PND, orthopnea, nausea, vomiting, dizziness, syncope, edema, weight gain, or early satiety. He has no known recurrences of atrial fibrillation since his last cardioversion.   Review of systems complete and found to be negative unless listed in HPI.   EP Information / Studies Reviewed:    EKG is ordered today. Personal review as below.  EKG Interpretation Date/Time:  Tuesday July 28 2023 14:26:18 EDT Ventricular Rate:  65 PR Interval:  294 QRS Duration:  102 QT Interval:  442 QTC Calculation: 459 R Axis:   -36  Text Interpretation: Sinus rhythm with 1st degree A-V block Left axis deviation When compared with ECG of 09 - Oct - 2018, no significant change. Confirmed by Nobie Putnam [phone]) on 07/28/2023 5:36:37 PM   Echo 07/15/21:  1. Inferobasal hypokinesis . Left ventricular ejection fraction, by  estimation, is 50 to 55%. The left ventricle has low normal function. The  left ventricle demonstrates regional wall motion abnormalities (see  scoring diagram/findings for description).  The left ventricular internal cavity size was mildly dilated. There is  moderate left ventricular hypertrophy. Left ventricular diastolic  parameters were normal.   2. Right ventricular systolic function is normal. The right ventricular  size is normal.   3. Left atrial size was moderately dilated.   4. Possible PFO suggest f/u bubble study.   5. The mitral valve is abnormal. Trivial mitral valve  regurgitation. No  evidence of mitral stenosis.   6. The aortic valve is normal in structure. Aortic valve regurgitation is  not visualized. No aortic stenosis is present.   7. Aortic dilatation noted. 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 There is mild dilatation of the ascending aorta,  measuring 40 mm.   8. The inferior vena cava is normal in size with greater than 50%  respiratory variability, suggesting right atrial pressure of 3 mmHg.   Risk Assessment/Calculations:    CHA2DS2-VASc Score = 1   This indicates a 0.6% annual risk of stroke. The patient's score is based upon: CHF History: 0 HTN History: 1 Diabetes History: 0 Stroke History: 0 Vascular Disease History: 0 Age Score: 0 Gender Score: 0      Physical Exam:   VS:  BP (!) 152/84   Pulse 65   Ht 6\' 4"  (1.93 m)   Wt (!) 310 lb 12.8 oz (141 kg)   SpO2 94%   BMI 37.83 kg/m    Wt Readings from Last 3 Encounters:  07/28/23 (!) 310 lb 12.8 oz (141 kg)  06/01/23 (!) 309 lb 3.2 oz (140.3 kg)  01/21/23 (!) 316 lb 4 oz (143.5 kg)     GEN: Well nourished, well developed in no acute distress NECK: No JVD; No carotid bruits CARDIAC: Regular rate and rhythm, no murmurs, rubs, gallops RESPIRATORY:  Clear to auscultation without rales, wheezing or rhonchi  ABDOMEN: Soft, non-tender, non-distended EXTREMITIES:  No edema; No deformity   ASSESSMENT AND PLAN:   Maurice Golden is a 62 y.o. male with  h/o hypertension, obstructive sleep apnea and persistent atrial fibrillation seen today for routine electrophysiology followup.   #. Persistent atrial fibrillation: No known recurrences since his last cardioversion in 2018. He has been on amiodarone for many years, despite his young age. Would like to avoid long-term complications of amiodarone.  - Continue metoprolol.  - Stop amiodarone. If he has recurrence then we will discuss a different anti-arrhythmic medication vs ablation. He has preserved  LVEF. Would perform stress echocardiogram and choose AAD accordingly.   #. Secondary hypercoagulable state due to atrial fibrillation: Taking Xarelto without issue.   - Continue Xarelto.   #. HTN:  Above goal today.  Recommend checking blood pressures 1-2 times per week at home and recording the values.  Recommend bringing these recordings to the primary care physician.  #. OSA:  - Encouraged CPAP use.   Follow up with Dr. Jimmey Ralph in 6 months   Total time of encounter: 60 minutes total time of encounter, including chart review, face-to-face patient care, coordination of care and counseling regarding high complexity medical decision making.  Signed, Nobie Putnam, MD

## 2023-07-28 ENCOUNTER — Ambulatory Visit: Payer: Medicare Other | Attending: Cardiology | Admitting: Cardiology

## 2023-07-28 ENCOUNTER — Encounter: Payer: Self-pay | Admitting: Cardiology

## 2023-07-28 VITALS — BP 152/84 | HR 65 | Ht 76.0 in | Wt 310.8 lb

## 2023-07-28 DIAGNOSIS — G4733 Obstructive sleep apnea (adult) (pediatric): Secondary | ICD-10-CM | POA: Diagnosis not present

## 2023-07-28 DIAGNOSIS — I10 Essential (primary) hypertension: Secondary | ICD-10-CM | POA: Diagnosis not present

## 2023-07-28 DIAGNOSIS — D6869 Other thrombophilia: Secondary | ICD-10-CM

## 2023-07-28 DIAGNOSIS — I4819 Other persistent atrial fibrillation: Secondary | ICD-10-CM | POA: Diagnosis not present

## 2023-07-28 NOTE — Patient Instructions (Signed)
Medication Instructions:  Your physician has recommended you make the following change in your medication:  1) STOP taking amiodarone *If you need a refill on your cardiac medications before your next appointment, please call your pharmacy*  Follow-Up: At [facility], you and your health needs are our priority.  As part of our continuing mission to provide you with exceptional heart care, we have created designated Provider Care Teams.  These Care Teams include your primary Cardiologist (physician) and Advanced Practice Providers (APPs -  Physician Assistants and Nurse Practitioners) who all work together to provide you with the care you need, when you need it.  Your next appointment:   6 months  Provider:   You may see Nobie Putnam, MD or one of the following Advanced Practice Providers on your designated Care Team:   Francis Dowse, [place] [address] [place], [place] Sherie Don, NP Canary Brim, NP

## 2023-08-06 ENCOUNTER — Other Ambulatory Visit: Payer: Self-pay | Admitting: Student

## 2023-08-11 ENCOUNTER — Encounter: Payer: Self-pay | Admitting: Podiatry

## 2023-08-11 ENCOUNTER — Ambulatory Visit: Payer: Medicare Other | Admitting: Podiatry

## 2023-08-11 ENCOUNTER — Ambulatory Visit (INDEPENDENT_AMBULATORY_CARE_PROVIDER_SITE_OTHER): Payer: Medicare Other

## 2023-08-11 DIAGNOSIS — R600 Localized edema: Secondary | ICD-10-CM

## 2023-08-11 DIAGNOSIS — M778 Other enthesopathies, not elsewhere classified: Secondary | ICD-10-CM

## 2023-08-11 DIAGNOSIS — M19071 Primary osteoarthritis, right ankle and foot: Secondary | ICD-10-CM | POA: Diagnosis not present

## 2023-08-11 MED ORDER — TRIAMCINOLONE ACETONIDE 10 MG/ML IJ SUSP
10.0000 mg | Freq: Once | INTRAMUSCULAR | Status: AC
Start: 2023-08-11 — End: 2023-08-11
  Administered 2023-08-11: 10 mg via INTRA_ARTICULAR

## 2023-08-11 NOTE — Progress Notes (Signed)
Chief Complaint  Patient presents with   Foot Pain    RM# 6 Right foot pain had bilateral knee replacement and after that his foot started swelling and aching.    HPI: 62 y.o. male presents today with pain across the right ankle and top of foot.  Denies injury.  States the area is swollen and painful.  Notes he is due for 1st MPJ fusion in January 2025.   Past Medical History:  Diagnosis Date   Allergy    ANEMIA    PATIENT DENIES   Arthritis    Bradycardia    Cataract    Hypertension    Obesity    OSA (obstructive sleep apnea)    cpap    Paroxysmal atrial fibrillation [facility])     Past Surgical History:  Procedure Laterality Date   ABLATION  01/26/2014   PVI by Dr Johney Frame   APPENDECTOMY     ATRIAL FIBRILLATION ABLATION [name] 01/26/2014   Procedure: ATRIAL FIBRILLATION ABLATION;  Surgeon: Gardiner Rhyme, MD;  [facility]: [facility] CATH LAB;  Service: Cardiovascular;  Laterality: [name];   BILATERAL KNEE ARTHROSCOPY     CARDIOVERSION  04/29/2012   Procedure: CARDIOVERSION;  Surgeon: Vesta Mixer, MD;  Location: [facility] ENDOSCOPY;  Service: Cardiovascular;  Laterality: [name];   CARDIOVERSION [name] 12/21/2013   Procedure: CARDIOVERSION;  Surgeon: Wendall Stade, MD;  Location: [facility] ENDOSCOPY;  Service: Cardiovascular;  Laterality: [name];   CARDIOVERSION [name] 05/30/2014   Procedure: CARDIOVERSION;  Surgeon: Laurey Morale, MD;  Location: [facility] ENDOSCOPY;  Service: Cardiovascular;  Laterality: [name];   CARDIOVERSION [name] 04/26/2015   Procedure: CARDIOVERSION;  Surgeon: [name], MD;  Location: [facility] ENDOSCOPY;  Service: Cardiovascular;  Laterality: [name];   CARDIOVERSION [name] 07/21/2017   Procedure: CARDIOVERSION;  Surgeon: [name], MD;  Location: [facility] ENDOSCOPY;  Service: Cardiovascular;  Laterality: [name];   CATARACT EXTRACTION Right 2012   EYE SURGERY     JOINT REPLACEMENT     L eye cataract     bilat   TEE WITHOUT CARDIOVERSION  04/29/2012   Procedure: TRANSESOPHAGEAL ECHOCARDIOGRAM (TEE);   Surgeon: Vesta Mixer, MD;  Location: [facility] ENDOSCOPY;  Service: Cardiovascular;  Laterality: [name];  changed from nish to nahser/dl   TEE WITHOUT CARDIOVERSION [name] 01/25/2014   Procedure: TRANSESOPHAGEAL ECHOCARDIOGRAM (TEE);  Surgeon: [name], MD;  Location: [facility] ENDOSCOPY;  Service: Cardiovascular;  Laterality: [name];   TOTAL KNEE ARTHROPLASTY Right 10/27/2016   Procedure: TOTAL KNEE ARTHROPLASTY;  Surgeon: Jodi Geralds, MD;  Location: [facility] OR;  Service: Orthopedics;  Laterality: Right;   TOTAL KNEE ARTHROPLASTY Left 06/08/2020   Procedure: TOTAL KNEE ARTHROPLASTY;  Surgeon: Jodi Geralds, MD;  Location: [facility] ORS;  Service: Orthopedics;  Laterality: Left;    Allergies  Allergen Reactions   Hydrocodone Itching and Other (See Comments)    Severe itching   Other Itching and Other (See Comments)    All narcotics - Severe itching     Review of Systems  Musculoskeletal:  Positive for joint pain.     Physical Exam: General: The patient is alert and oriented x3 in no acute distress.  Dermatology: Skin is warm, dry and supple right foot. Interspaces are clear of maceration and debris.    Vascular: Palpable pedal pulses right foot.  Capillary refill within normal limits.  Localized edema right ankle/talonavicular joint area.  No erythema  Neurological: Light touch sensation grossly intact .  Musculoskeletal Exam:  Stiffness with attempted ROM of  midtarsal joint.  Decreased ROM 1st MPJ right foot.  Significant pain on palpation to the dorsal aspect of the right second metatarsal-intermediate cuneiform joint.  Some pain on palpation across the talonavicular joint dorsally.  Radiographic Exam (right foot, 3 weightbearing views, 08/11/2023):  Normal osseous mineralization.  Significant degenerative joint changes to the talonavicular joint and first metatarsophalangeal joint with dorsal spurring noted at the first metatarsal head.  Medial angulation of third toe at the DIP joint  level  Assessment/Plan of Care: 1. Osteoarthritis of right ankle and foot   2. Capsulitis of foot      Meds ordered this encounter  Medications   triamcinolone acetonide (KENALOG) 10 MG/ML injection 10 mg   Discussed clinical & radiographic findings with patient today.  Informed him it appears that he is having a flare up of osteoarthritis near the right ankle.  His talonavicular joint is significantly degenerative and arthritic.  The distal joints can often flareup as they make up for the lack of motion at the TN joint.  With the patient's consent a corticosteroid injection was administered to the right second metatarsal-intermediate cuneiform joint.  This was his area of exquisite tenderness on palpation today.  This consisted of a mixture of 1% lidocaine plain, 0.5% Sensorcaine plain, and Kenalog 10 for total of 1.25 cc administered.  He tolerated this well a Band-Aid was applied.  He was fitted for an elastic ankle sleeve to wear for additional support and management of edema  Will avoid anti-inflammatories since he is on Xarelto.  Follow-up as needed  Clerance Lav, DPM, FACFAS [facility]     [address] [place], [place] [address]                Office [phone]  Fax [phone]

## 2023-08-28 ENCOUNTER — Ambulatory Visit: Payer: Medicare Other | Admitting: Family Medicine

## 2023-09-07 ENCOUNTER — Encounter: Payer: Self-pay | Admitting: Family Medicine

## 2023-09-07 ENCOUNTER — Ambulatory Visit: Payer: Medicare Other | Admitting: Family Medicine

## 2023-09-07 VITALS — BP 140/80 | HR 76 | Temp 98.2°F | Resp 18 | Ht 76.0 in | Wt 307.1 lb

## 2023-09-07 DIAGNOSIS — D649 Anemia, unspecified: Secondary | ICD-10-CM | POA: Diagnosis not present

## 2023-09-07 DIAGNOSIS — R972 Elevated prostate specific antigen [PSA]: Secondary | ICD-10-CM | POA: Diagnosis not present

## 2023-09-07 DIAGNOSIS — E119 Type 2 diabetes mellitus without complications: Secondary | ICD-10-CM

## 2023-09-07 NOTE — Patient Instructions (Addendum)
Edemame pasta, chick pea pasta  Referral sent to nutritionist.

## 2023-09-07 NOTE — Progress Notes (Signed)
Subjective:    Patient ID: Maurice Golden, male    DOB: 1961-03-19, 62 y.o.   MRN: [id number]  Chief Complaint  Patient presents with   Medical Management of Chronic Issues    Follow-up to discuss recent lab results and treatment options    HPI Elevated PSA - Will be seeing Urologist in January. Denies frequency, dysuria, urgency, or straining.  Reviewed labs.   New DM Type 2 - Patient is not following a healthy diet and exercising regularly. Thoroughly advised on how to follow a low carb and low sugar diet. Reports he wants to try diet and exercise before medications. Has been told for years "borderline"  HTN - Bp at initial check was 160/80. Bp at recheck was 140/80. Bp running not checked. No ha/dizziness/cp/palp/edema/cough/sob. At home, 120's  Anemia - Reviewed labs. No bleeding.   No colon ever(declines), declines cologard.  Health Maintenance Due  Topic Date Due   Medicare Annual Wellness (AWV)  Never done   FOOT EXAM  Never done   OPHTHALMOLOGY EXAM  Never done   Diabetic kidney evaluation - Urine ACR  Never done    Past Medical History:  Diagnosis Date   Allergy    ANEMIA    PATIENT DENIES   Arthritis    Bradycardia    Cataract    Hypertension    Obesity    OSA (obstructive sleep apnea)    cpap    Paroxysmal atrial fibrillation [facility])     Past Surgical History:  Procedure Laterality Date   ABLATION  01/26/2014   PVI by Dr Johney Frame   APPENDECTOMY     ATRIAL FIBRILLATION ABLATION [name] 01/26/2014   Procedure: ATRIAL FIBRILLATION ABLATION;  Surgeon: Gardiner Rhyme, MD;  [facility] CATH LAB;  Service: Cardiovascular;  Laterality: [name];   BILATERAL KNEE ARTHROSCOPY     CARDIOVERSION  04/29/2012   Procedure: CARDIOVERSION;  Surgeon: Vesta Mixer, MD;  Location: [facility] ENDOSCOPY;  Service: Cardiovascular;  Laterality: [name];   CARDIOVERSION [name] 12/21/2013   Procedure: CARDIOVERSION;  Surgeon: Wendall Stade, MD;  Location: [facility] ENDOSCOPY;  Service: Cardiovascular;   Laterality: [name];   CARDIOVERSION [name] 05/30/2014   Procedure: CARDIOVERSION;  Surgeon: Laurey Morale, MD;  Location: [facility] ENDOSCOPY;  Service: Cardiovascular;  Laterality: [name];   CARDIOVERSION [name] 04/26/2015   Procedure: CARDIOVERSION;  Surgeon: [name], MD;  Location: [facility] ENDOSCOPY;  Service: Cardiovascular;  Laterality: [name];   CARDIOVERSION [name] 07/21/2017   Procedure: CARDIOVERSION;  Surgeon: [name], MD;  Location: [facility] ENDOSCOPY;  Service: Cardiovascular;  Laterality: [name];   CATARACT EXTRACTION Right 2012   EYE SURGERY     JOINT REPLACEMENT     L eye cataract     bilat   TEE WITHOUT CARDIOVERSION  04/29/2012   Procedure: TRANSESOPHAGEAL ECHOCARDIOGRAM (TEE);  Surgeon: Vesta Mixer, MD;  Location: [facility] ENDOSCOPY;  Service: Cardiovascular;  Laterality: [name];  changed from nish to nahser/dl   TEE WITHOUT CARDIOVERSION [name] 01/25/2014   Procedure: TRANSESOPHAGEAL ECHOCARDIOGRAM (TEE);  Surgeon: [name], MD;  Location: [facility] ENDOSCOPY;  Service: Cardiovascular;  Laterality: [name];   TOTAL KNEE ARTHROPLASTY Right 10/27/2016   Procedure: TOTAL KNEE ARTHROPLASTY;  Surgeon: Jodi Geralds, MD;  Location: [facility] OR;  Service: Orthopedics;  Laterality: Right;   TOTAL KNEE ARTHROPLASTY Left 06/08/2020   Procedure: TOTAL KNEE ARTHROPLASTY;  Surgeon: Jodi Geralds, MD;  Location: [facility] ORS;  Service: Orthopedics;  Laterality: Left;     Current Outpatient Medications:  acetaminophen (TYLENOL) 500 MG tablet, as needed., Disp: , Rfl:    amLODipine (NORVASC) 5 MG tablet, Take 1 tablet (5 mg total) by mouth daily., Disp: 90 tablet, Rfl: 2   ELDERBERRY PO, Take 2 tablets by mouth daily., Disp: , Rfl:    metoprolol succinate (TOPROL-XL) 25 MG 24 hr tablet, TAKE 1 TABLET (25 MG TOTAL) BY MOUTH DAILY., Disp: 90 tablet, Rfl: 3   Multiple Vitamin (MULTIVITAMIN WITH MINERALS) TABS tablet, Take 3 tablets by mouth daily., Disp: , Rfl:    XARELTO 20 MG TABS tablet, TAKE 1 TABLET BY MOUTH DAILY WITH  SUPPER., Disp: 90 tablet, Rfl: 1  Allergies  Allergen Reactions   Hydrocodone Itching and Other (See Comments)    Severe itching   Other Itching and Other (See Comments)    All narcotics - Severe itching    ROS neg/noncontributory except as noted HPI/below    Objective:     BP (!) 140/80   Pulse 76   Temp 98.2 F (36.8 C) (Temporal)   Resp 18   Ht 6\' 4"  (1.93 m)   Wt (!) 307 lb 2 oz (139.3 kg)   SpO2 97%   BMI 37.38 kg/m  Wt Readings from Last 3 Encounters:  09/07/23 (!) 307 lb 2 oz (139.3 kg)  07/28/23 (!) 310 lb 12.8 oz (141 kg)  06/01/23 (!) 309 lb 3.2 oz (140.3 kg)    Physical Exam   Gen: WDWN NAD HEENT: NCAT, conjunctiva not injected, sclera nonicteric NECK:  supple, no thyromegaly, no nodes, no carotid bruits CARDIAC: RRR, S1S2+, no murmur. DP 2+B LUNGS: CTAB. No wheezes EXT:  no edema MSK: no gross abnormalities.  NEURO: A&O x3.  CN II-XII intact.  PSYCH: normal mood. Good eye contact  Reviewed labs w/pt    Assessment & Plan:  Elevated PSA -     PSA  Controlled type 2 diabetes mellitus without complication, without long-term current use of insulin (HCC) Assessment & Plan: New.  "Borderline" for years.  Discussed options.  Pt wants to work on diet/exercise and see nutritionist.    Orders: -     Comprehensive metabolic panel -     Hemoglobin A1c -     Amb ref to Medical Nutrition Therapy-MNT  Anemia, unspecified type Assessment & Plan: Chronic. At times, post surgical.  No overt bleeding.  Declines colon/cologard.  Will check labs.  Orders: -     CBC with Differential/Platelet -     IBC + Ferritin -     Vitamin B12   Elevated PSA-asymptomatic.  Not sure if bph, cancer, other.  Has appt in Jan w/urol.  Check psa  Return for as sch in feb.  Germaine Pomfret Rice,acting as a scribe for Angelena Sole, MD.,have documented all relevant documentation on the behalf of Angelena Sole, MD,as directed by  Angelena Sole, MD while in the presence of Angelena Sole,  MD.  I, Angelena Sole, MD, have reviewed all documentation for this visit. The documentation on 09/07/23 for the exam, diagnosis, procedures, and orders are all accurate and complete.   Angelena Sole, MD

## 2023-09-07 NOTE — Assessment & Plan Note (Signed)
New.  "Borderline" for years.  Discussed options.  Pt wants to work on diet/exercise and see nutritionist.

## 2023-09-07 NOTE — Assessment & Plan Note (Signed)
Chronic. At times, post surgical.  No overt bleeding.  Declines colon/cologard.  Will check labs.

## 2023-09-08 LAB — IBC + FERRITIN
Ferritin: 233.8 ng/mL (ref 22.0–322.0)
Iron: 82 ug/dL (ref 42–165)
Saturation Ratios: 25.2 % (ref 20.0–50.0)
TIBC: 324.8 ug/dL (ref 250.0–450.0)
Transferrin: 232 mg/dL (ref 212.0–360.0)

## 2023-09-08 LAB — CBC WITH DIFFERENTIAL/PLATELET
Basophils Absolute: 0.2 10*3/uL — ABNORMAL HIGH (ref 0.0–0.1)
Basophils Relative: 2.6 % (ref 0.0–3.0)
Eosinophils Absolute: 0.1 10*3/uL (ref 0.0–0.7)
Eosinophils Relative: 2.5 % (ref 0.0–5.0)
HCT: 34.3 % — ABNORMAL LOW (ref 39.0–52.0)
Hemoglobin: 11.5 g/dL — ABNORMAL LOW (ref 13.0–17.0)
Lymphocytes Relative: 29.4 % (ref 12.0–46.0)
Lymphs Abs: 1.7 10*3/uL (ref 0.7–4.0)
MCHC: 33.6 g/dL (ref 30.0–36.0)
MCV: 86.4 fL (ref 78.0–100.0)
Monocytes Absolute: 0.5 10*3/uL (ref 0.1–1.0)
Monocytes Relative: 9.4 % (ref 3.0–12.0)
Neutro Abs: 3.3 10*3/uL (ref 1.4–7.7)
Neutrophils Relative %: 56.1 % (ref 43.0–77.0)
Platelets: 364 10*3/uL (ref 150.0–400.0)
RBC: 3.96 Mil/uL — ABNORMAL LOW (ref 4.22–5.81)
RDW: 14.6 % (ref 11.5–15.5)
WBC: 5.8 10*3/uL (ref 4.0–10.5)

## 2023-09-08 LAB — COMPREHENSIVE METABOLIC PANEL
ALT: 12 U/L (ref 0–53)
AST: 13 U/L (ref 0–37)
Albumin: 3.9 g/dL (ref 3.5–5.2)
Alkaline Phosphatase: 69 U/L (ref 39–117)
BUN: 15 mg/dL (ref 6–23)
CO2: 25 meq/L (ref 19–32)
Calcium: 9.7 mg/dL (ref 8.4–10.5)
Chloride: 103 meq/L (ref 96–112)
Creatinine, Ser: 0.95 mg/dL (ref 0.40–1.50)
GFR: 85.56 mL/min (ref >60.00)
Glucose, Bld: 189 mg/dL — ABNORMAL HIGH (ref 70–99)
Potassium: 3.9 meq/L (ref 3.5–5.1)
Sodium: 138 meq/L (ref 135–145)
Total Bilirubin: 0.6 mg/dL (ref 0.2–1.2)
Total Protein: 7.3 g/dL (ref 6.0–8.3)

## 2023-09-08 LAB — VITAMIN B12: Vitamin B-12: 1152 pg/mL — ABNORMAL HIGH (ref 211–911)

## 2023-09-08 LAB — PSA: PSA: 5.82 ng/mL — ABNORMAL HIGH (ref 0.10–4.00)

## 2023-09-08 LAB — HEMOGLOBIN A1C: Hgb A1c MFr Bld: 7.4 % — ABNORMAL HIGH (ref 4.6–6.5)

## 2023-09-08 NOTE — Progress Notes (Signed)
Hgb stable.   Sugars same-work on diet/exercise as discussed Prostate same-elevated.  He has appt in Jan w/urology

## 2023-09-21 ENCOUNTER — Ambulatory Visit: Payer: Medicare Other | Admitting: Podiatry

## 2023-09-21 ENCOUNTER — Encounter: Payer: Self-pay | Admitting: Podiatry

## 2023-09-21 DIAGNOSIS — E1169 Type 2 diabetes mellitus with other specified complication: Secondary | ICD-10-CM

## 2023-09-21 DIAGNOSIS — M2022 Hallux rigidus, left foot: Secondary | ICD-10-CM

## 2023-09-21 DIAGNOSIS — R600 Localized edema: Secondary | ICD-10-CM

## 2023-09-21 DIAGNOSIS — M19071 Primary osteoarthritis, right ankle and foot: Secondary | ICD-10-CM | POA: Diagnosis not present

## 2023-09-21 MED ORDER — TRIAMCINOLONE ACETONIDE 10 MG/ML IJ SUSP
10.0000 mg | Freq: Once | INTRAMUSCULAR | Status: AC
  Administered 2023-09-21: 10 mg

## 2023-09-21 NOTE — Progress Notes (Signed)
Chief Complaint  Patient presents with   Foot Pain    Follow up osteoarthritis foot and ankle right   "I don't understand how one day I wake up with a swollen ankle and its arthritis"   HPI: 62 y.o. male presents today for follow-up of right midfoot arthritis pain.  To recap, patient underwent bilateral knee replacements and states that shortly after this, his right foot started swelling and aching.  He denies any injury or feeling any pop or snap sensation during that time.  Denies any bruising to the area.  On his last visit he noted that he was due for a first MPJ fusion in January 2025 by Dr. Ardelle Anton.  On his last visit although the bony prominence and significant arthritic findings were noted at the talonavicular joint, on clinical exam his most severe pain on palpation was at the second metatarsal-intermediate cuneiform joint.  A cortisone injection was applied to that area.  Although he stating that the injection did not work he also was not noting any pain in this area today.  He notes all of his pain is near the ankle.  Patient expressed frustration stating "I do not know how I go to bed one day and wake up the next day with this lump on my ankle and I suddenly have arthritis".  Past Medical History:  Diagnosis Date   Allergy    ANEMIA    PATIENT DENIES   Arthritis    Bradycardia    Cataract    Hypertension    Obesity    OSA (obstructive sleep apnea)    cpap    Paroxysmal atrial fibrillation [facility])     Past Surgical History:  Procedure Laterality Date   ABLATION  01/26/2014   PVI by Dr Johney Frame   APPENDECTOMY     ATRIAL FIBRILLATION ABLATION [name] 01/26/2014   Procedure: ATRIAL FIBRILLATION ABLATION;  Surgeon: Gardiner Rhyme, MD;  [facility]: [facility] CATH LAB;  Service: Cardiovascular;  Laterality: [name];   BILATERAL KNEE ARTHROSCOPY     CARDIOVERSION  04/29/2012   Procedure: CARDIOVERSION;  Surgeon: Vesta Mixer, MD;  Location: [facility] ENDOSCOPY;  Service: Cardiovascular;   Laterality: [name];   CARDIOVERSION [name] 12/21/2013   Procedure: CARDIOVERSION;  Surgeon: Wendall Stade, MD;  Location: [facility] ENDOSCOPY;  Service: Cardiovascular;  Laterality: [name];   CARDIOVERSION [name] 05/30/2014   Procedure: CARDIOVERSION;  Surgeon: Laurey Morale, MD;  Location: [facility] ENDOSCOPY;  Service: Cardiovascular;  Laterality: [name];   CARDIOVERSION [name] 04/26/2015   Procedure: CARDIOVERSION;  Surgeon: [name], MD;  Location: [facility] ENDOSCOPY;  Service: Cardiovascular;  Laterality: [name];   CARDIOVERSION [name] 07/21/2017   Procedure: CARDIOVERSION;  Surgeon: [name], MD;  Location: [facility] ENDOSCOPY;  Service: Cardiovascular;  Laterality: [name];   CATARACT EXTRACTION Right 2012   EYE SURGERY     JOINT REPLACEMENT     L eye cataract     bilat   TEE WITHOUT CARDIOVERSION  04/29/2012   Procedure: TRANSESOPHAGEAL ECHOCARDIOGRAM (TEE);  Surgeon: Vesta Mixer, MD;  Location: [facility] ENDOSCOPY;  Service: Cardiovascular;  Laterality: [name];  changed from nish to nahser/dl   TEE WITHOUT CARDIOVERSION [name] 01/25/2014   Procedure: TRANSESOPHAGEAL ECHOCARDIOGRAM (TEE);  Surgeon: [name], MD;  Location: [facility] ENDOSCOPY;  Service: Cardiovascular;  Laterality: [name];   TOTAL KNEE ARTHROPLASTY Right 10/27/2016   Procedure: TOTAL KNEE ARTHROPLASTY;  Surgeon: Jodi Geralds, MD;  Location: [facility] OR;  Service: Orthopedics;  Laterality: Right;   TOTAL KNEE ARTHROPLASTY Left  06/08/2020   Procedure: TOTAL KNEE ARTHROPLASTY;  Surgeon: Jodi Geralds, MD;  Location: [facility] ORS;  Service: Orthopedics;  Laterality: Left;    Allergies  Allergen Reactions   Hydrocodone Itching and Other (See Comments)    Severe itching   Other Itching and Other (See Comments)    All narcotics - Severe itching     Review of Systems  Musculoskeletal:  Positive for joint pain.     Physical Exam: There are palpable pedal pulses to the right foot.  Edema localized to the anterolateral midfoot/rear foot area.  No calor or rubor or ecchymosis  is noted.  Prominent bony palpable area on the dorsal and medial aspect of the midfoot near the midtarsal joint.  On palpation of the prominent talonavicular joint area, there was no actual pain on palpation today to this area.  There was no pain on palpation at the second metatarsal-intermediate cuneiform joint.  There is a bony palpable prominence on the dorsal aspect of the first MPJ consistent with previous diagnosis of dorsal bur with hallux rigidus.  Radiographic Exam (right foot, 3 weightbearing views, 08/11/2023):  Normal osseous mineralization.  Significant degenerative joint changes to the talonavicular joint and first metatarsophalangeal joint with dorsal spurring noted at the first metatarsal head.  Medial angulation of third toe at the DIP joint level  Assessment/Plan of Care: 1. Osteoarthritis of right ankle and foot   2. Localized edema   3. Hallux rigidus of left foot   4. Type 2 diabetes mellitus with other specified complication, without long-term current use of insulin (HCC)    Discussed clinical findings with patient today.  The patient was shown his x-rays again and the amount of arthritic degenerative changes and dorsal spurring at the talonavicular joint.  Informed the patient that this is simply would not be something that "popped up" overnight.  This has taken a while to develop.  Patient asked if he needs to have surgery on this area.  I did briefly discuss the possibility of an AFO but he did not seem interested in pursuing this.  He was wondering if he could have surgery to fix the extremely arthritic talonavicular joint at the same time he has the first metatarsal phalangeal joint arthrodesis by Dr. Loreta Ave.  I informed the patient that I do not perform this type of rear foot arthritic surgery, so I will defer any response to that question to Dr. Ardelle Anton or one of our other very skilled podiatric surgeons.  He did seem to indicate that if he will be having surgery in the next  month and is told that both procedures cannot be performed at the same time, he may prefer to have the talonavicular joint repaired first prior to the first MPJ fusion.  When we were discussing surgeons to perform his procedure, the patient stated that "no one tells me who is going to operate on me, I decide who my surgeon is going to be".  I informed the patient that this is simply what is in his previous progress notes and what the surgical plan was prior to his initial visit with me.  I will try to get the patient rescheduled with Dr. Ardelle Anton soon for surgical consultation regarding the arthritic talonavicular joint and first MPJ.  Although he was completely asymptomatic on exam today he noted that he can have extreme pain to that ankle/talonavicular joint area.  With the patient's consent and following a sterile skin prep, a corticosteroid injection was administered to the right  talonavicular joint consisting of a mixture of 1% Xylocaine plain, 0.5% Marcaine plain, and Kenalog 10 for total of 1.25 cc administered.  He tolerated this well and a Band-Aid was applied.  Recommended he continue with his elastic ankle support until evaluated by Dr. Ardelle Anton.  At his next appointment with Dr. Ardelle Anton, I do recommend a new set of weightbearing x-rays to include 3 views of the foot and possibly AP view of the ankle.  These new x-rays should also help rule out any possible Charcot arthropathy of the rearfoot/midfoot.  His most recent A1c two weeks ago was 7.4.   Clerance Lav, DPM, FACFAS [facility]     [address] [place], [place] [address]                Office [phone]  Fax [phone]

## 2023-09-28 ENCOUNTER — Encounter: Payer: Self-pay | Admitting: Podiatry

## 2023-09-28 ENCOUNTER — Ambulatory Visit: Payer: Medicare Other | Admitting: Podiatry

## 2023-09-28 DIAGNOSIS — M19071 Primary osteoarthritis, right ankle and foot: Secondary | ICD-10-CM | POA: Diagnosis not present

## 2023-10-01 NOTE — Progress Notes (Signed)
Subjective: Chief Complaint  Patient presents with   Foot Pain    RM#11 Surgical consult right foot pain and swelling.   62 year old male presents the office with concerns mostly to the right foot.  He has had injections which helped temporarily but he still gets pain and decreased motion to the right foot.  He wants to know what is going on.  No injuries that he reports.  He send lipids been doing well.  Objective: AAO x3, NAD DP/PT pulses palpable bilaterally, CRT less than 3 seconds There is tenderness along the talonavicular joint and dorsal prominence noted on this area.  There is decreased range of motion of the subtalar joint.  Ankle joint range of motion itself appears to be intact. No pain with calf compression, swelling, warmth, erythema  Assessment: Right foot arthritis  Plan: -All treatment options discussed with the patient including all alternatives, risks, complications.  -I reviewed the x-rays with him.  Discussed treatment option both including surgical as well as conservative treatment.  I like to get the MRI to further evaluate.  This has been ordered today.  Discussed that even with surgery he would still not have good range of motion but hopefully not having pain. -Patient encouraged to call the office with any questions, concerns, change in symptoms.   Vivi Barrack DPM

## 2023-10-22 ENCOUNTER — Ambulatory Visit
Admission: RE | Admit: 2023-10-22 | Discharge: 2023-10-22 | Disposition: A | Discharge status: Home / Self Care | Payer: Medicare Other | Source: Ambulatory Visit | Attending: Podiatry | Admitting: Podiatry

## 2023-10-22 DIAGNOSIS — M19071 Primary osteoarthritis, right ankle and foot: Secondary | ICD-10-CM

## 2023-10-29 ENCOUNTER — Encounter: Payer: Self-pay | Admitting: Podiatry

## 2023-11-10 ENCOUNTER — Ambulatory Visit: Payer: Medicare Other | Admitting: Skilled Nursing Facility1

## 2023-11-17 ENCOUNTER — Ambulatory Visit: Payer: Medicare Other | Admitting: Podiatry

## 2023-11-17 ENCOUNTER — Encounter: Payer: Self-pay | Admitting: Podiatry

## 2023-11-17 DIAGNOSIS — M19071 Primary osteoarthritis, right ankle and foot: Secondary | ICD-10-CM | POA: Diagnosis not present

## 2023-11-18 NOTE — Progress Notes (Signed)
 Subjective: Chief Complaint  Patient presents with   Foot Pain    RM#12  Patient states is here for MRI results.    63 year old male presents the office with concerns mostly to the right foot.  Since I saw him last he states he is doing much better.  Is been regular shoes and the inflammation, swelling is improved.  No recent injury or changes otherwise.  No new concerns.   Objective: AAO x3, NAD DP/PT pulses palpable bilaterally, CRT less than 3 seconds There is no significant tenderness along the talonavicular joint and dorsal prominence noted on this area.  There is decreased range of motion of the subtalar joint.  Ankle joint range of motion itself appears to be intact.  There is no ascending pain of the foot today. No pain with calf compression, swelling, warmth, erythema  Assessment: Right foot arthritis  Plan: -All treatment options discussed with the patient including all alternatives, risks, complications.  -I reviewed the MRI findings with the patient.  We discussed the conservative as well as surgical treatment options.  At this time his symptoms have much improved.  May continue with conservative management.  Discussed shoes, good support as well as stiffer soled shoes. Monitor for recurrence.  Return if symptoms worsen or fail to improve.  Donnice JONELLE Fees DPM

## 2023-12-02 ENCOUNTER — Encounter: Payer: Medicare Other | Admitting: Family Medicine

## 2023-12-08 ENCOUNTER — Ambulatory Visit (INDEPENDENT_AMBULATORY_CARE_PROVIDER_SITE_OTHER): Payer: Medicare Other | Admitting: Family Medicine

## 2023-12-08 ENCOUNTER — Encounter: Payer: Self-pay | Admitting: Family Medicine

## 2023-12-08 VITALS — BP 150/90 | HR 73 | Temp 98.4°F | Resp 18 | Ht 76.0 in | Wt 303.2 lb

## 2023-12-08 DIAGNOSIS — D649 Anemia, unspecified: Secondary | ICD-10-CM | POA: Diagnosis not present

## 2023-12-08 DIAGNOSIS — L989 Disorder of the skin and subcutaneous tissue, unspecified: Secondary | ICD-10-CM

## 2023-12-08 DIAGNOSIS — R972 Elevated prostate specific antigen [PSA]: Secondary | ICD-10-CM

## 2023-12-08 DIAGNOSIS — E119 Type 2 diabetes mellitus without complications: Secondary | ICD-10-CM

## 2023-12-08 DIAGNOSIS — Z0000 Encounter for general adult medical examination without abnormal findings: Secondary | ICD-10-CM | POA: Diagnosis not present

## 2023-12-08 DIAGNOSIS — I10 Essential (primary) hypertension: Secondary | ICD-10-CM | POA: Diagnosis not present

## 2023-12-08 DIAGNOSIS — I480 Paroxysmal atrial fibrillation: Secondary | ICD-10-CM | POA: Diagnosis not present

## 2023-12-08 LAB — MICROALBUMIN / CREATININE URINE RATIO
Creatinine,U: 402.4 mg/dL
Microalb Creat Ratio: 18.1 mg/g (ref 0.0–30.0)
Microalb, Ur: 7.3 mg/dL — ABNORMAL HIGH (ref 0.0–1.9)

## 2023-12-08 NOTE — Progress Notes (Signed)
 Phone: [phone]   Subjective:  Patient 63 y.o. male presenting for annual physical.  Chief Complaint  Patient presents with   Annual Exam    CPE    Annual Elevated PSA - didn't see Urologist in January. Denies frequency, dysuria, urgency, or straining.  Reviewed labs.    New DM Type 2 - Patient is making changes towards a healthy diet and exercising regularly.  still not accepting dx HTN -. Pt on amlodipine 5mg  and metoprolol xl 25mg .  Bp running 118-128/78-92. No ha/dizziness/cp/palp/edema/cough/sob. At home, 120's   Anemia - Reviewed labs. No bleeding.   No colon ever(declines), declines cologard. PAF-on toprol and xarelto 20mg   Discussed the use of AI scribe software for clinical note transcription with the patient, who gave verbal consent to proceed.  History of Present Illness   Maurice Golden is a 63 year old male with diabetes, hypertension, elevated PSA, and atrial fibrillation who presents for follow-up on multiple chronic conditions.  He has diabetes and has made dietary changes, such as switching to wheat bread and reducing fried foods. He has not yet seen a nutritionist and is unsure about his next appointment. No symptoms of excessive thirst, blurry vision, or frequent urination are present.  He monitors his blood pressure daily, with readings ranging from 118/78 to 128/92. He is currently taking amlodipine 5 mg and metoprolol 25 mg. No headaches, dizziness, chest pain, shortness of breath, or leg swelling are reported.  He has not seen a urologist for his elevated PSA, which was supposed to be addressed in January. He is concerned about the possibility of cancer but has not scheduled a follow-up appointment yet.  His atrial fibrillation has been stable for a long time, and he is taking Xarelto 20 mg every night after eating. No active bleeding or dark tarry stools are reported, and bowel movements are normal in color.  He mentions chronic issues with his foot  and neck, attributing neck pain to sleeping on the couch. He has not seen a dermatologist for a recurring skin issue on his neck, which he describes as coming and going. No significant muscle or joint pain is reported, except for occasional stiffness in a previously broken finger and arthritis-related discomfort.  He does not drink alcohol due to an allergy and is the designated driver for friends. He enjoys socializing and values having a good rapport with his healthcare providers.       See problem oriented charting- ROS- ROS: Gen: no fever, chills  Skin: rash back of neck for long time.  Itches.  Has tried otc and no change ENT: no ear pain, ear drainage, nasal congestion, rhinorrhea, sinus pressure, sore throat Eyes: no blurry vision, double vision Resp: no cough, wheeze,SOB CV: no CP, palpitations, LE edema,  GI: no heartburn, n/v/d/c, abd pain GU: no dysuria, urgency, frequency, hematuria MSK: some arthritis.  Ankle and neck.  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/21/2023   Osteoarthritis of left knee 06/08/2020   Status post total knee replacement, left 06/08/2020   Postoperative anemia due to acute blood loss 10/30/2016   Primary osteoarthritis of right knee 10/27/2016   Dizziness    Persistent atrial fibrillation (HCC)    Cardiomyopathy (HCC) 01/25/2014   Abnormal EKG 11/17/2011   Atrial fibrillation (HCC) 09/10/2011    Obstructive sleep apnea 02/05/2010   OBESITY, MORBID 02/04/2010   ANEMIA 02/04/2010   Essential hypertension 02/04/2010   BRADYCARDIA 02/04/2010   Past Surgical History:  Procedure Laterality Date   ABLATION  01/26/2014   PVI by Dr Johney Frame   APPENDECTOMY     ATRIAL FIBRILLATION ABLATION [name] 01/26/2014   Procedure: ATRIAL FIBRILLATION ABLATION;  Surgeon: Gardiner Rhyme, MD;  Location: [facility] CATH LAB;  Service: Cardiovascular;  Laterality: [name];   BILATERAL KNEE ARTHROSCOPY     CARDIOVERSION  04/29/2012   Procedure: CARDIOVERSION;  Surgeon: Vesta Mixer, MD;  Location: [facility] ENDOSCOPY;  Service: Cardiovascular;  Laterality: [name];   CARDIOVERSION [name] 12/21/2013   Procedure: CARDIOVERSION;  Surgeon: Wendall Stade, MD;  Location: [facility] ENDOSCOPY;  Service: Cardiovascular;  Laterality: [name];   CARDIOVERSION [name] 05/30/2014   Procedure: CARDIOVERSION;  Surgeon: Laurey Morale, MD;  Location: [facility] ENDOSCOPY;  Service: Cardiovascular;  Laterality: [name];   CARDIOVERSION [name] 04/26/2015   Procedure: CARDIOVERSION;  Surgeon: [name], MD;  Location: [facility] ENDOSCOPY;  Service: Cardiovascular;  Laterality: [name];   CARDIOVERSION [name] 07/21/2017   Procedure: CARDIOVERSION;  Surgeon: [name], MD;  Location: [facility] ENDOSCOPY;  Service: Cardiovascular;  Laterality: [name];   CATARACT EXTRACTION Right 2012   EYE SURGERY     JOINT REPLACEMENT     L eye cataract     bilat   TEE WITHOUT CARDIOVERSION  04/29/2012   Procedure: TRANSESOPHAGEAL ECHOCARDIOGRAM (TEE);  Surgeon: Vesta Mixer, MD;  Location: [facility] ENDOSCOPY;  Service: Cardiovascular;  Laterality: [name];  changed from nish to nahser/dl   TEE WITHOUT CARDIOVERSION [name] 01/25/2014   Procedure: TRANSESOPHAGEAL ECHOCARDIOGRAM (TEE);  Surgeon: [name], MD;  Location: [facility] ENDOSCOPY;  Service: Cardiovascular;  Laterality: [name];   TOTAL KNEE ARTHROPLASTY Right 10/27/2016   Procedure: TOTAL KNEE ARTHROPLASTY;  Surgeon: Jodi Geralds, MD;  Location: [facility] OR;   Service: Orthopedics;  Laterality: Right;   TOTAL KNEE ARTHROPLASTY Left 06/08/2020   Procedure: TOTAL KNEE ARTHROPLASTY;  Surgeon: Jodi Geralds, MD;  Location: [facility] ORS;  Service: Orthopedics;  Laterality: Left;    Family History  Problem Relation Age of Onset   Sarcoidosis Mother    Kidney disease Mother    Arthritis Mother    Diabetes Mother    Hypertension Mother    Hypertension Father    Sarcoidosis Brother     Medications- reviewed and updated Current Outpatient Medications  Medication Sig Dispense Refill   acetaminophen (TYLENOL) 500 MG tablet as needed.     amLODipine (NORVASC) 5 MG tablet Take 1 tablet (5 mg total) by mouth daily. 90 tablet 2   ELDERBERRY PO Take 2 tablets by mouth daily.     metoprolol succinate (TOPROL-XL) 25 MG 24 hr tablet TAKE 1 TABLET (25 MG TOTAL) BY MOUTH DAILY. 90 tablet 3   Multiple Vitamin (MULTIVITAMIN WITH MINERALS) TABS tablet Take 3 tablets by mouth daily.     XARELTO 20 MG TABS tablet TAKE 1 TABLET BY MOUTH DAILY WITH SUPPER. 90 tablet 1   No current facility-administered medications for  this visit.    Allergies-reviewed and updated Allergies  Allergen Reactions   Hydrocodone Itching and Other (See Comments)    Severe itching   Other Itching and Other (See Comments)    All narcotics - Severe itching     Social History   Social History Narrative   Lives with mother in [place].  Retired/ disabled from [employer].   Objective  Objective:  BP (!) 150/90 (BP Location: Left Arm, Patient Position: Sitting)   Pulse 73   Temp 98.4 F (36.9 C) (Temporal)   Resp 18   Ht 6\' 4"  (1.93 m)   Wt (!) 303 lb 4 oz (137.6 kg)   SpO2 97%   BMI 36.91 kg/m  Physical Exam  Gen: WDWN NAD HEENT: NCAT, conjunctiva not injected, sclera nonicteric TM WNL B, OP moist, no exudates  NECK:  supple, no thyromegaly, no nodes, no carotid bruits CARDIAC: RRR, S1S2+, no murmur. DP 2+B LUNGS: CTAB. No wheezes ABDOMEN:  BS+, soft, NTND, No HSM, no  masses EXT:  no edema MSK: no gross abnormalities. MS 5/5 all 4 NEURO: A&O x3.  CN II-XII intact.  PSYCH: normal mood. Good eye contact      Assessment and Plan   Health Maintenance counseling: 1. Anticipatory guidance: Patient counseled regarding regular dental exams q6 months, eye exams yearly, avoiding smoking and second hand smoke, limiting alcohol to 2 beverages per day.   2. Risk factor reduction:  Advised patient of need for regular exercise and diet rich in fruits and vegetables to reduce risk of heart attack and stroke. Exercise- encouraged.   Wt Readings from Last 3 Encounters:  12/08/23 (!) 303 lb 4 oz (137.6 kg)  09/07/23 (!) 307 lb 2 oz (139.3 kg)  07/28/23 (!) 310 lb 12.8 oz (141 kg)   3. Immunizations/screenings/ancillary studies Immunization History  Administered Date(s) Administered   Fluzone Influenza virus vaccine,trivalent (IIV3), split virus 07/30/2011   Tdap 01/04/2013   Health Maintenance Due  Topic Date Due   Medicare Annual Wellness (AWV)  Never done   Pneumococcal Vaccine 52-3 Years old (1 of 2 - PCV) Never done   FOOT EXAM  Never done   OPHTHALMOLOGY EXAM  Never done    4. Prostate cancer screening >55yo - risk factors?  Lab Results  Component Value Date   PSA 5.82 (H) 09/07/2023   PSA 5.84 (H) 06/01/2023    5. Colon cancer screening:declines 6. Skin cancer screening- Iadvised regular sunscreen use. Denies worrisome, changing, or new skin lesions.  7. Smoking associated screening (lung cancer screening, AAA screen 65-75, UA)- non smoker- 8. STD screening - n/a  Wellness examination  Anemia, unspecified type -     CBC with Differential/Platelet -     IBC + Ferritin -     Vitamin B12  Controlled type 2 diabetes mellitus without complication, without long-term current use of insulin (HCC) -     Comprehensive metabolic panel -     Lipid panel -     TSH -     Hemoglobin A1c -     Microalbumin / creatinine urine ratio  Paroxysmal atrial  fibrillation (HCC)  Essential hypertension  Elevated PSA -     PSA  Skin lesion -     Ambulatory referral to Dermatology  Assessment and Plan    Diabetes Mellitus Type 2 Diagnosed since at least 2012, with a current A1c of 7.4 indicating suboptimal control. Discussed dietary modifications, such as reducing high-sugar fruits and starches, and emphasized regular  monitoring and follow-up. Explained that diabetes persists even with an A1c of 6.9. Seeing a dietician for personalized advice is beneficial. Refer to a dietician for dietary counseling, continue monitoring blood glucose levels, and schedule a follow-up in 3 months.  Hypertension Managed with amlodipine 5 mg and metoprolol 25 mg. Home blood pressure readings range from 118-128/78-92 mmHg, with no symptoms of headache, dizziness, chest pain, or leg swelling. Current readings are generally good, but consistent monitoring for elevations is necessary. Continue amlodipine 5 mg and metoprolol 25 mg, and maintain daily blood pressure monitoring. bp high today, but controlled outpt  Atrial Fibrillation Managed with Xarelto 20 mg daily and metoprolol XL 25mg , with no recent episodes and compliance with medication. Continued compliance is crucial to prevent stroke and other complications. Continue Xarelto 20 mg daily.  Elevated PSA PSA levels are elevated, and a urology appointment was missed in January. Discussed potential prostate cancer risk and the importance of follow-up with a urologist. Early detection and treatment can significantly improve outcomes. Call and schedule an appointment with a urologist.  Anemia No active bleeding or melena, and bowel movements are normal in color. Monitoring for changes in stool color or other signs of bleeding is important. Continue monitoring for signs of bleeding.  General Health Maintenance Discussed the importance of staying active, eating right, and avoiding alcohol and drugs. An eye exam is  scheduled for April 4th, and it is important to inform the eye specialist about diabetes for appropriate retinal checks. Ensure the eye exam on April 4th includes diabetes-related checks and continue general health maintenance practices.  Follow-up Schedule a follow-up appointment in 3 months and call to schedule an appointment with a dermatologist for a neck issue.        Recommended follow up: Return in about 3 months (around 03/06/2024) for chronic follow-up.  Lab/Order associations: fasting   Angelena Sole, MD

## 2023-12-08 NOTE — Patient Instructions (Addendum)
 It was very nice to see you today!  [facility] [address] [place], [place], [place] [address] [phone]   Have eye doctor send copy of exam   PLEASE NOTE:  If you had any lab tests please let us know if you have not heard back within a few days. You may see your results on MyChart before we have a chance to review them but we will give you a call once they are reviewed by [place]. If we ordered any referrals today, please let us know if you have not heard from their office within the next week.   Please try these tips to maintain a healthy lifestyle:  Eat most of your calories during the day when you are active. Eliminate processed foods including packaged sweets (pies, cakes, cookies), reduce intake of potatoes, white bread, white pasta, and white rice. Look for whole grain options, oat flour or almond flour.  Each meal should contain half fruits/vegetables, one quarter protein, and one quarter carbs (no bigger than a computer mouse).  Cut down on sweet beverages. This includes juice, soda, and sweet tea. Also watch fruit intake, though this is a healthier sweet option, it still contains natural sugar! Limit to 3 servings daily.  Drink at least 1 glass of water with each meal and aim for at least 8 glasses per day  Exercise at least 150 minutes every week.

## 2023-12-09 ENCOUNTER — Encounter: Payer: Self-pay | Admitting: Family Medicine

## 2023-12-09 LAB — IBC + FERRITIN
Ferritin: 161 ng/mL (ref 22.0–322.0)
Iron: 65 ug/dL (ref 42–165)
Saturation Ratios: 19.1 % — ABNORMAL LOW (ref 20.0–50.0)
TIBC: 340.2 ug/dL (ref 250.0–450.0)
Transferrin: 243 mg/dL (ref 212.0–360.0)

## 2023-12-09 LAB — LIPID PANEL
Cholesterol: 165 mg/dL (ref 0–200)
HDL: 39.8 mg/dL (ref >39.00)
LDL Cholesterol: 103 mg/dL — ABNORMAL HIGH (ref 0–99)
NonHDL: 125.03
Total CHOL/HDL Ratio: 4
Triglycerides: 111 mg/dL (ref 0.0–149.0)
VLDL: 22.2 mg/dL (ref 0.0–40.0)

## 2023-12-09 LAB — CBC WITH DIFFERENTIAL/PLATELET
Basophils Absolute: 0.1 10*3/uL (ref 0.0–0.1)
Basophils Relative: 1.3 % (ref 0.0–3.0)
Eosinophils Absolute: 0.1 10*3/uL (ref 0.0–0.7)
Eosinophils Relative: 2.1 % (ref 0.0–5.0)
HCT: 37.1 % — ABNORMAL LOW (ref 39.0–52.0)
Hemoglobin: 12.5 g/dL — ABNORMAL LOW (ref 13.0–17.0)
Lymphocytes Relative: 31.6 % (ref 12.0–46.0)
Lymphs Abs: 1.7 10*3/uL (ref 0.7–4.0)
MCHC: 33.7 g/dL (ref 30.0–36.0)
MCV: 84.6 fl (ref 78.0–100.0)
Monocytes Absolute: 0.5 10*3/uL (ref 0.1–1.0)
Monocytes Relative: 9.2 % (ref 3.0–12.0)
Neutro Abs: 2.9 10*3/uL (ref 1.4–7.7)
Neutrophils Relative %: 55.8 % (ref 43.0–77.0)
Platelets: 340 10*3/uL (ref 150.0–400.0)
RBC: 4.38 Mil/uL (ref 4.22–5.81)
RDW: 14.8 % (ref 11.5–15.5)
WBC: 5.3 10*3/uL (ref 4.0–10.5)

## 2023-12-09 LAB — PSA: PSA: 4.71 ng/mL — ABNORMAL HIGH (ref 0.10–4.00)

## 2023-12-09 LAB — HEMOGLOBIN A1C: Hgb A1c MFr Bld: 7.3 % — ABNORMAL HIGH (ref 4.6–6.5)

## 2023-12-09 LAB — COMPREHENSIVE METABOLIC PANEL
ALT: 14 U/L (ref 0–53)
AST: 15 U/L (ref 0–37)
Albumin: 4 g/dL (ref 3.5–5.2)
Alkaline Phosphatase: 71 U/L (ref 39–117)
BUN: 18 mg/dL (ref 6–23)
CO2: 28 meq/L (ref 19–32)
Calcium: 9.5 mg/dL (ref 8.4–10.5)
Chloride: 102 meq/L (ref 96–112)
Creatinine, Ser: 1.11 mg/dL (ref 0.40–1.50)
GFR: 70.85 mL/min (ref >60.00)
Glucose, Bld: 123 mg/dL — ABNORMAL HIGH (ref 70–99)
Potassium: 3.9 meq/L (ref 3.5–5.1)
Sodium: 139 meq/L (ref 135–145)
Total Bilirubin: 0.5 mg/dL (ref 0.2–1.2)
Total Protein: 7.6 g/dL (ref 6.0–8.3)

## 2023-12-09 LAB — TSH: TSH: 0.43 u[IU]/mL (ref 0.35–5.50)

## 2023-12-09 LAB — VITAMIN B12: Vitamin B-12: 936 pg/mL — ABNORMAL HIGH (ref 211–911)

## 2023-12-11 NOTE — Progress Notes (Signed)
 Sugars are not ideal nor is cholesterol.  He needs to consider taking meds for both-if willing, can schedule to discuss

## 2023-12-15 ENCOUNTER — Ambulatory Visit: Payer: Medicare Other | Admitting: Family Medicine

## 2023-12-15 ENCOUNTER — Encounter: Payer: Self-pay | Admitting: Family Medicine

## 2023-12-15 VITALS — BP 144/86 | HR 88 | Temp 98.6°F | Resp 18 | Ht 76.0 in | Wt 303.2 lb

## 2023-12-15 DIAGNOSIS — I10 Essential (primary) hypertension: Secondary | ICD-10-CM

## 2023-12-15 DIAGNOSIS — N5202 Corporo-venous occlusive erectile dysfunction: Secondary | ICD-10-CM | POA: Diagnosis not present

## 2023-12-15 DIAGNOSIS — E119 Type 2 diabetes mellitus without complications: Secondary | ICD-10-CM | POA: Diagnosis not present

## 2023-12-15 DIAGNOSIS — Z5320 Procedure and treatment not carried out because of patient's decision for unspecified reasons: Secondary | ICD-10-CM

## 2023-12-15 DIAGNOSIS — Z7984 Long term (current) use of oral hypoglycemic drugs: Secondary | ICD-10-CM | POA: Diagnosis not present

## 2023-12-15 MED ORDER — METFORMIN HCL 500 MG PO TABS: 500.0000 mg | ORAL_TABLET | Freq: Two times a day (BID) | ORAL | 2 refills | Status: DC

## 2023-12-15 NOTE — Patient Instructions (Signed)
 Starting metformin twice daily - breakfast and supper Let me know if problems  Consider statins for the cholesterol.

## 2023-12-15 NOTE — Progress Notes (Signed)
 Subjective:     Patient ID: Maurice Golden, male    DOB: 09-Jul-1961, 63 y.o.   MRN: [id number]  Chief Complaint  Patient presents with   Results    Discuss recent results and treatment options    HPI Discussed the use of AI scribe software for clinical note transcription with the patient, who gave verbal consent to proceed.  History of Present Illness   Errick Golden is a 63 year old male with diabetes and hypertension who presents for a follow-up visit.  He monitors his blood pressure at home, typically recording values around 128/84 mmHg at 11 AM. However, during clinic visits, his blood pressure rises, indicating a possible white coat syndrome. He has been using a home blood pressure machine for about five years and maintains it by regularly changing the batteries.  He was diagnosed with diabetes in 2012(per pt, unaware), with his hemoglobin A1c consistently around 7.3-7.4% for several years. He is actively monitoring his diet and exercise to manage his condition. still trying to accept diagnosis  He experiences erectile dysfunction, noting difficulty in maintaining an erection, although initial erection is not an issue. He is not using any medications for this and is cautious about over-the-counter products. His partner is not complaining  His diet primarily consists of baked foods and vegetables, avoiding greasy and fatty foods. He frequently consumes mayonnaise as he enjoys sandwiches and has switched to oatmeal and high-grain products instead of white bread. He is unsure about his cholesterol levels and is considering dietary changes to manage it.  He has a history of cataracts and has undergone surgery, which improved his vision. He is scheduled to see an eye doctor on April 4th.       Health Maintenance Due  Topic Date Due   Medicare Annual Wellness (AWV)  Never done   FOOT EXAM  Never done   OPHTHALMOLOGY EXAM  Never done    Past Medical History:  Diagnosis Date    Allergy    ANEMIA    PATIENT DENIES   Arthritis    Bradycardia    Cataract    Hypertension    Obesity    OSA (obstructive sleep apnea)    cpap    Paroxysmal atrial fibrillation [facility])     Past Surgical History:  Procedure Laterality Date   ABLATION  01/26/2014   PVI by Dr Johney Frame   APPENDECTOMY     ATRIAL FIBRILLATION ABLATION [name] 01/26/2014   Procedure: ATRIAL FIBRILLATION ABLATION;  Surgeon: Gardiner Rhyme, MD;  Location: [facility] CATH LAB;  Service: Cardiovascular;  Laterality: [name];   BILATERAL KNEE ARTHROSCOPY     CARDIOVERSION  04/29/2012   Procedure: CARDIOVERSION;  Surgeon: Vesta Mixer, MD;  Location: [facility] ENDOSCOPY;  Service: Cardiovascular;  Laterality: [name];   CARDIOVERSION [name] 12/21/2013   Procedure: CARDIOVERSION;  Surgeon: Wendall Stade, MD;  Location: [facility] ENDOSCOPY;  Service: Cardiovascular;  Laterality: [name];   CARDIOVERSION [name] 05/30/2014   Procedure: CARDIOVERSION;  Surgeon: Laurey Morale, MD;  Location: [facility] ENDOSCOPY;  Service: Cardiovascular;  Laterality: [name];   CARDIOVERSION [name] 04/26/2015   Procedure: CARDIOVERSION;  Surgeon: [name], MD;  Location: [facility] ENDOSCOPY;  Service: Cardiovascular;  Laterality: [name];   CARDIOVERSION [name] 07/21/2017   Procedure: CARDIOVERSION;  Surgeon: [name], MD;  Location: [facility] ENDOSCOPY;  Service: Cardiovascular;  Laterality: [name];   CATARACT EXTRACTION Right 2012   EYE SURGERY     JOINT REPLACEMENT     L eye  cataract     bilat   TEE WITHOUT CARDIOVERSION  04/29/2012   Procedure: TRANSESOPHAGEAL ECHOCARDIOGRAM (TEE);  Surgeon: Vesta Mixer, MD;  Location: [facility] ENDOSCOPY;  Service: Cardiovascular;  Laterality: [name];  changed from nish to nahser/dl   TEE WITHOUT CARDIOVERSION [name] 01/25/2014   Procedure: TRANSESOPHAGEAL ECHOCARDIOGRAM (TEE);  Surgeon: [name], MD;  Location: [facility] ENDOSCOPY;  Service: Cardiovascular;  Laterality: [name];   TOTAL KNEE ARTHROPLASTY Right 10/27/2016   Procedure: TOTAL KNEE ARTHROPLASTY;   Surgeon: Jodi Geralds, MD;  Location: [facility] OR;  Service: Orthopedics;  Laterality: Right;   TOTAL KNEE ARTHROPLASTY Left 06/08/2020   Procedure: TOTAL KNEE ARTHROPLASTY;  Surgeon: Jodi Geralds, MD;  Location: [facility] ORS;  Service: Orthopedics;  Laterality: Left;     Current Outpatient Medications:    acetaminophen (TYLENOL) 500 MG tablet, as needed., Disp: , Rfl:    amLODipine (NORVASC) 5 MG tablet, Take 1 tablet (5 mg total) by mouth daily., Disp: 90 tablet, Rfl: 2   ELDERBERRY PO, Take 2 tablets by mouth daily., Disp: , Rfl:    metFORMIN (GLUCOPHAGE) 500 MG tablet, Take 1 tablet (500 mg total) by mouth 2 (two) times daily with a meal., Disp: 60 tablet, Rfl: 2   metoprolol succinate (TOPROL-XL) 25 MG 24 hr tablet, TAKE 1 TABLET (25 MG TOTAL) BY MOUTH DAILY., Disp: 90 tablet, Rfl: 3   Multiple Vitamin (MULTIVITAMIN WITH MINERALS) TABS tablet, Take 3 tablets by mouth daily., Disp: , Rfl:    XARELTO 20 MG TABS tablet, TAKE 1 TABLET BY MOUTH DAILY WITH SUPPER., Disp: 90 tablet, Rfl: 1  Allergies  Allergen Reactions   Hydrocodone Itching and Other (See Comments)    Severe itching   Other Itching and Other (See Comments)    All narcotics - Severe itching    ROS neg/noncontributory except as noted HPI/below      Objective:     BP (!) 144/86 (BP Location: Left Arm, Patient Position: Sitting, Cuff Size: Large)   Pulse 88   Temp 98.6 F (37 C) (Temporal)   Resp 18   Ht 6\' 4"  (1.93 m)   Wt (!) 303 lb 4 oz (137.6 kg)   SpO2 97%   BMI 36.91 kg/m  Wt Readings from Last 3 Encounters:  12/15/23 (!) 303 lb 4 oz (137.6 kg)  12/08/23 (!) 303 lb 4 oz (137.6 kg)  09/07/23 (!) 307 lb 2 oz (139.3 kg)    Physical Exam   Gen: WDWN NAD HEENT: NCAT, conjunctiva not injected, sclera nonicteric MSK: no gross abnormalities.  NEURO: A&O x3.  CN II-XII intact.  PSYCH: normal mood. Good eye contact  Discussed labs     Assessment & Plan:  Controlled type 2 diabetes mellitus without complication,  without long-term current use of insulin (HCC)  Other orders -     metFORMIN HCl; Take 1 tablet (500 mg total) by mouth 2 (two) times daily with a meal.  Dispense: 60 tablet; Refill: 2  Assessment and Plan    Type 2 Diabetes Mellitus   Diagnosed in 2012, his current HbA1c levels are 7.3-7.4%. The goal is to reduce HbA1c to below 7% to prevent complications. Metformin is recommended as the first-line treatment due to its efficacy and safety. Potential side effects, such as diarrhea, were discussed and may subside after a few weeks. Discussed side effects. Alternative options like Ozempic were mentioned but pt prefers to hold for now.. Prescribe metformin 500 mg twice daily with meals. Monitor for side effects like diarrhea  or constipation. Encourage dietary modifications and exercise to aid in glycemic control.  Hypertension   Blood pressure readings at home are generally normal, but elevated in the office, likely due to white coat syndrome. Home readings are approximately 128/84 mmHg, while in-office was 144/86 mmHg. He has been monitoring blood pressure at home for several years. Discussed verifying the accuracy of the home blood pressure machine by comparing it with the office machine. Bring the home blood pressure machine to the next appointment for comparison.  Hyperlipidemia   Increased cardiovascular risk associated with diabetes necessitates managing cholesterol levels. Statins are recommended as a preventive measure to protect the vascular system. Potential side effects, including muscle pain and liver enzyme elevation, were discussed. Emphasized the importance of protecting the heart due to increased risk of heart attack in diabetic patients. Consider starting statin therapy for cholesterol management and monitor for side effects if initiated. pt wants to think about it  Erectile Dysfunction   He experiences difficulty maintaining an erection, though no issues with initial arousal or  penetration. This may be related to age and existing hypertension. Discussed potential use of medications like Viagra or Cialis, but he is reluctant to use these. Advised caution with over-the-counter products and emphasized consulting with a healthcare provider before use. Monitor symptoms and consider treatment options if the condition worsens or becomes bothersome.  General Health Maintenance   Discussed dietary habits and their impact on cholesterol levels. He is conscious of dietary choices and is working on [employer]. Emphasized the importance of regular eye exams due to diabetes and the need to inform the eye doctor of the diabetes diagnosis. Continue dietary modifications to manage cholesterol. Attend the upcoming eye doctor appointment on April 4th and inform him of the diabetes diagnosis.        Return for as sch in May.  Angelena Sole, MD

## 2023-12-21 ENCOUNTER — Encounter: Payer: Self-pay | Admitting: Skilled Nursing Facility1

## 2023-12-21 ENCOUNTER — Encounter: Payer: Medicare Other | Attending: Family Medicine | Admitting: Skilled Nursing Facility1

## 2023-12-21 DIAGNOSIS — E119 Type 2 diabetes mellitus without complications: Secondary | ICD-10-CM | POA: Insufficient documentation

## 2023-12-21 NOTE — Progress Notes (Signed)
 DM medications: Metformin  Labs: A1C 7.3 LDL 103  Other Diagnosis: Allergies Anemia Arthritis Cataracts HTN OSA   Pt states he rides his bike during the summer 2 times a week. Pt states he uses his resistance bands 3 times a week stating he is going through neck pain currently.  Pt states he always adds Herbs and garlic to his foods for the health benefit. Pt states he already limits fatty meats and excess fat in general as well as limiting sugary beverages as he has never cared for them anyway. Pt states for education currently the only thing he is interested in how and what to eat to continue with managed diabetes.   Diabetes Self-Management Education  Visit Type: First/Initial  Appt. Start Time: 2:35 Appt. End Time: 3:15  12/21/2023  [name], identified by name and date of birth, is a 63 y.o. male with a diagnosis of Diabetes: Type 2.   ASSESSMENT  There were no vitals taken for this visit. There is no height or weight on file to calculate BMI.   Diabetes Self-Management Education - 12/21/23 1444       Visit Information   Visit Type First/Initial      Initial Visit   Diabetes Type Type 2    Are you currently following a meal plan? No    Are you taking your medications as prescribed? Yes      Health Coping   How would you rate your overall health? Good      Psychosocial Assessment   Patient Belief/Attitude about Diabetes Motivated to manage diabetes    What is the hardest part about your diabetes right now, causing you the most concern, or is the most worrisome to you about your diabetes?   Making healty food and beverage choices    Self-care barriers None    Self-management support None    Patient Concerns Nutrition/Meal planning    Special Needs None    Learning Readiness Contemplating    How often do you need to have someone help you when you read instructions, pamphlets, or other written materials from your doctor or pharmacy? 1 - Never       Pre-Education Assessment   Patient understands the diabetes disease and treatment process. Needs Instruction    Patient understands incorporating nutritional management into lifestyle. Needs Instruction    Patient undertands incorporating physical activity into lifestyle. Needs Instruction    Patient understands using medications safely. Needs Instruction    Patient understands monitoring blood glucose, interpreting and using results Needs Instruction    Patient understands prevention, detection, and treatment of acute complications. Needs Instruction    Patient understands prevention, detection, and treatment of chronic complications. Needs Instruction    Patient understands how to develop strategies to address psychosocial issues. Needs Instruction    Patient understands how to develop strategies to promote health/change behavior. Needs Instruction      Complications   Last HgB A1C per patient/outside source 7.3 %      Dietary Intake   Breakfast 2 whole fruits    Lunch 1-3pm: peanut butter jelly sandwich    Dinner cheese and crackers    Beverage(s) water, cranberry juice      Activity / Exercise   Activity / Exercise Type ADL's    How many days per week do you exercise? 0    How many minutes per day do you exercise? 0    Total minutes per week of exercise 0  Patient Education   Previous Diabetes Education No    Healthy Eating Role of diet in the treatment of diabetes and the relationship between the three main macronutrients and blood glucose level;Carbohydrate counting;Food label reading, portion sizes and measuring food.;Plate Method;Meal timing in regards to the patients' current diabetes medication.;Reviewed blood glucose goals for pre and post meals and how to evaluate the patients' food intake on their blood glucose level.;Information on hints to eating out and maintain blood glucose control.    Being Active Role of exercise on diabetes management, blood pressure control and  cardiac health.;Helped patient identify appropriate exercises in relation to his/her diabetes, diabetes complications and other health issue.    Monitoring Purpose and frequency of SMBG.      Individualized Goals (developed by patient)   Nutrition Follow meal plan discussed;General guidelines for healthy choices and portions discussed    Physical Activity Exercise 5-7 days per week;60 minutes per day    Medications take my medication as prescribed    Problem Solving Eating Pattern      Post-Education Assessment   Patient understands the diabetes disease and treatment process. Demonstrates understanding / competency    Patient understands incorporating nutritional management into lifestyle. Demonstrates understanding / competency    Patient undertands incorporating physical activity into lifestyle. Demonstrates understanding / competency    Patient understands using medications safely. Demonstrates understanding / competency    Patient understands monitoring blood glucose, interpreting and using results Demonstrates understanding / competency    Patient understands prevention, detection, and treatment of acute complications. Demonstrates understanding / competency    Patient understands prevention, detection, and treatment of chronic complications. Demonstrates understanding / competency    Patient understands how to develop strategies to address psychosocial issues. Demonstrates understanding / competency    Patient understands how to develop strategies to promote health/change behavior. Demonstrates understanding / competency      Outcomes   Expected Outcomes Demonstrated interest in learning. Expect positive outcomes    Future DMSE PRN    Program Status Completed             Individualized Plan for Diabetes Self-Management Training:   Learning Objective:  Patient will have a greater understanding of diabetes self-management. Patient education plan is to attend individual and/or  group sessions per assessed needs and concerns.    Expected Outcomes:  Demonstrated interest in learning. Expect positive outcomes  Education material provided: ADA - How to Thrive: A Guide for Your Journey with Diabetes, My Plate, and Snack sheet  If problems or questions, patient to contact team via:  Phone and Email  Future DSME appointment: PRN

## 2024-01-06 ENCOUNTER — Encounter: Payer: Medicare Other | Admitting: Family Medicine

## 2024-01-21 ENCOUNTER — Other Ambulatory Visit: Payer: Self-pay | Admitting: Cardiology

## 2024-01-22 NOTE — Telephone Encounter (Signed)
 Prescription refill request for Xarelto received.  Indication:afib Last office visit:10/24 Weight:137.6  kg Age:63 Scr:1.11  2/25 CrCl:132.57  ml/min  Prescription refilled

## 2024-03-08 ENCOUNTER — Ambulatory Visit: Payer: Medicare Other | Admitting: Family Medicine

## 2024-03-08 LAB — OPHTHALMOLOGY REPORT-SCANNED

## 2024-03-11 ENCOUNTER — Ambulatory Visit (INDEPENDENT_AMBULATORY_CARE_PROVIDER_SITE_OTHER): Admitting: Family Medicine

## 2024-03-11 ENCOUNTER — Encounter: Payer: Self-pay | Admitting: Family Medicine

## 2024-03-11 VITALS — BP 132/82 | HR 79 | Temp 98.0°F | Ht 76.0 in | Wt 300.8 lb

## 2024-03-11 DIAGNOSIS — G479 Sleep disorder, unspecified: Secondary | ICD-10-CM

## 2024-03-11 DIAGNOSIS — M170 Bilateral primary osteoarthritis of knee: Secondary | ICD-10-CM | POA: Insufficient documentation

## 2024-03-11 DIAGNOSIS — H6092 Unspecified otitis externa, left ear: Secondary | ICD-10-CM

## 2024-03-11 DIAGNOSIS — I10 Essential (primary) hypertension: Secondary | ICD-10-CM | POA: Diagnosis not present

## 2024-03-11 DIAGNOSIS — R7303 Prediabetes: Secondary | ICD-10-CM | POA: Insufficient documentation

## 2024-03-11 DIAGNOSIS — E119 Type 2 diabetes mellitus without complications: Secondary | ICD-10-CM

## 2024-03-11 DIAGNOSIS — R972 Elevated prostate specific antigen [PSA]: Secondary | ICD-10-CM | POA: Diagnosis not present

## 2024-03-11 DIAGNOSIS — Z7901 Long term (current) use of anticoagulants: Secondary | ICD-10-CM | POA: Insufficient documentation

## 2024-03-11 DIAGNOSIS — R7301 Impaired fasting glucose: Secondary | ICD-10-CM

## 2024-03-11 DIAGNOSIS — R42 Dizziness and giddiness: Secondary | ICD-10-CM

## 2024-03-11 DIAGNOSIS — D649 Anemia, unspecified: Secondary | ICD-10-CM

## 2024-03-11 DIAGNOSIS — I480 Paroxysmal atrial fibrillation: Secondary | ICD-10-CM | POA: Insufficient documentation

## 2024-03-11 DIAGNOSIS — M179 Osteoarthritis of knee, unspecified: Secondary | ICD-10-CM

## 2024-03-11 HISTORY — DX: Osteoarthritis of knee, unspecified: M17.9

## 2024-03-11 HISTORY — DX: Bilateral primary osteoarthritis of knee: M17.0

## 2024-03-11 HISTORY — DX: Long term (current) use of anticoagulants: Z79.01

## 2024-03-11 HISTORY — DX: Sleep disorder, unspecified: G47.9

## 2024-03-11 HISTORY — DX: Unspecified otitis externa, left ear: H60.92

## 2024-03-11 HISTORY — DX: Prediabetes: R73.03

## 2024-03-11 HISTORY — DX: Dizziness and giddiness: R42

## 2024-03-11 HISTORY — DX: Anemia, unspecified: D64.9

## 2024-03-11 HISTORY — DX: Impaired fasting glucose: R73.01

## 2024-03-11 LAB — POCT GLYCOSYLATED HEMOGLOBIN (HGB A1C): Hemoglobin A1C: 8.5 % — AB (ref 4.0–5.6)

## 2024-03-11 MED ORDER — AMLODIPINE BESYLATE 5 MG PO TABS: 5.0000 mg | ORAL_TABLET | Freq: Every day | ORAL | 1 refills | Status: DC

## 2024-03-11 NOTE — Progress Notes (Signed)
 Subjective:     Patient ID: Maurice Golden, male    DOB: Mar 11, 1961, 63 y.o.   MRN: [id number]  Chief Complaint  Patient presents with   Hypertension   Diabetes    HPI Psa,h,dm Discussed the use of AI scribe software for clinical note transcription with the patient, who gave verbal consent to proceed.  History of Present Illness Maurice Golden is a 63 year old male with diabetes and hypertension who presents for follow-up of uncontrolled diabetes and back pain.  His diabetes has been uncontrolled, with an A1c increasing from 7.3 to 8.5 over the past three months. He was started on metformin  three months ago but has not been taking it due to concerns about side effects, specifically diarrhea. He is frustrated with taking multiple medications daily, including metoprolol , amlodipine , and Xarelto . He denies consuming cookies, cake, candy, sweet tea, sodas, or alcohol, but acknowledges eating bread, pretzels, chips, crackers. His diet includes fruit for breakfast, pizza or a sub for lunch, and chicken or fish with vegetables for dinner. He occasionally eats sweet potato chips and salads. He does not exercise regularly due to back pain.did see nutritionist  He describes significant back pain over the last month, which he characterizes as 'terrible'. He has received three injections and has been taking cyclobenzaprine  for relief. Initially, the pain subsided with cortisone injections but later shifted to the lower back. He enjoys riding bikes but is limited by his back pain.  His blood pressure at home is typically around 128/76 to 130/84. He experiences 'white coat syndrome' at medical offices, leading to higher readings. No headaches, chest pain, shortness of breath, or swelling in the legs.  He is generally reluctant to take medications, stating 'I'm not going crazy doped up on drugs'.  His weight has been stable between 298 and 303 pounds for the last fifteen years. He is concerned  about dietary changes, questioning 'what am I going to eat?' if he reduces his intake.  Elevated psa-has not sch w/urol yet      Health Maintenance Due  Topic Date Due   FOOT EXAM  Never done    Past Medical History:  Diagnosis Date   Allergy    ANEMIA    PATIENT DENIES   Arthritis    Bradycardia    Cataract    Diabetes mellitus without complication (HCC)    Hypertension    Impaired fasting glucose 03/11/2024   Long term (current) use of anticoagulants 03/11/2024   Normocytic anemia 03/11/2024   Obesity    OSA (obstructive sleep apnea)    cpap    Osteoarthritis of both knees 03/11/2024   Osteoarthritis of knee 03/11/2024   Otitis externa of left ear 03/11/2024   Paroxysmal atrial fibrillation (HCC)    Prediabetes 03/11/2024   Sleep disturbance 03/11/2024   Vertigo 03/11/2024    Past Surgical History:  Procedure Laterality Date   ABLATION  01/26/2014   PVI by Dr Nunzio Belch   APPENDECTOMY     ATRIAL FIBRILLATION ABLATION [name] 01/26/2014   Procedure: ATRIAL FIBRILLATION ABLATION;  Surgeon: Ellaree Gunther, MD;  Location: [facility] CATH LAB;  Service: Cardiovascular;  Laterality: [name];   BILATERAL KNEE ARTHROSCOPY     CARDIOVERSION  04/29/2012   Procedure: CARDIOVERSION;  Surgeon: Lake Pilgrim, MD;  Location: [facility] ENDOSCOPY;  Service: Cardiovascular;  Laterality: [name];   CARDIOVERSION [name] 12/21/2013   Procedure: CARDIOVERSION;  Surgeon: Loyde Rule, MD;  Location: [facility] ENDOSCOPY;  Service: Cardiovascular;  Laterality: [name];  CARDIOVERSION [name] 05/30/2014   Procedure: CARDIOVERSION;  Surgeon: Darlis Eisenmenger, MD;  Location: [facility] ENDOSCOPY;  Service: Cardiovascular;  Laterality: [name];   CARDIOVERSION [name] 04/26/2015   Procedure: CARDIOVERSION;  Surgeon: Hazle Lites, MD;  Location: [facility] ENDOSCOPY;  Service: Cardiovascular;  Laterality: [name];   CARDIOVERSION [name] 07/21/2017   Procedure: CARDIOVERSION;  Surgeon: Hazle Lites, MD;  Location: [facility] ENDOSCOPY;  Service: Cardiovascular;   Laterality: [name];   CATARACT EXTRACTION Right 2012   EYE SURGERY     JOINT REPLACEMENT     L eye cataract     bilat   TEE WITHOUT CARDIOVERSION  04/29/2012   Procedure: TRANSESOPHAGEAL ECHOCARDIOGRAM (TEE);  Surgeon: Lake Pilgrim, MD;  Location: [facility] ENDOSCOPY;  Service: Cardiovascular;  Laterality: [name];  changed from nish to nahser/dl   TEE WITHOUT CARDIOVERSION [name] 01/25/2014   Procedure: TRANSESOPHAGEAL ECHOCARDIOGRAM (TEE);  Surgeon: Hazle Lites, MD;  Location: [facility] ENDOSCOPY;  Service: Cardiovascular;  Laterality: [name];   TOTAL KNEE ARTHROPLASTY Right 10/27/2016   Procedure: TOTAL KNEE ARTHROPLASTY;  Surgeon: Neil Balls, MD;  Location: [facility] OR;  Service: Orthopedics;  Laterality: Right;   TOTAL KNEE ARTHROPLASTY Left 06/08/2020   Procedure: TOTAL KNEE ARTHROPLASTY;  Surgeon: Neil Balls, MD;  Location: [facility] ORS;  Service: Orthopedics;  Laterality: Left;     Current Outpatient Medications:    acetaminophen  (TYLENOL ) 500 MG tablet, as needed., Disp: , Rfl:    cyclobenzaprine  (FLEXERIL ) 10 MG tablet, SMARTSIG:1 Tablet(s) By Mouth 1-3 Times Daily PRN, Disp: , Rfl:    ELDERBERRY PO, Take 2 tablets by mouth daily., Disp: , Rfl:    metoprolol  succinate (TOPROL -XL) 25 MG 24 hr tablet, TAKE 1 TABLET (25 MG TOTAL) BY MOUTH DAILY., Disp: 90 tablet, Rfl: 3   Multiple Vitamin (MULTIVITAMIN WITH MINERALS) TABS tablet, Take 3 tablets by mouth daily., Disp: , Rfl:    XARELTO  20 MG TABS tablet, TAKE 1 TABLET BY MOUTH DAILY WITH SUPPER, Disp: 90 tablet, Rfl: 1   amLODipine  (NORVASC ) 5 MG tablet, Take 1 tablet (5 mg total) by mouth daily., Disp: 90 tablet, Rfl: 1  Allergies  Allergen Reactions   Hydrocodone  Itching and Other (See Comments)    Severe itching   Other Itching and Other (See Comments)    All narcotics - Severe itching    ROS neg/noncontributory except as noted HPI/below      Objective:      BP 132/82 (BP Location: Left Arm, Patient Position: Sitting, Cuff Size: Large)    Pulse 79   Temp 98 F (36.7 C) (Temporal)   Ht 6\' 4"  (1.93 m)   Wt (!) 300 lb 12.8 oz (136.4 kg)   SpO2 95%   BMI 36.61 kg/m  Wt Readings from Last 3 Encounters:  03/11/24 (!) 300 lb 12.8 oz (136.4 kg)  12/15/23 (!) 303 lb 4 oz (137.6 kg)  12/08/23 (!) 303 lb 4 oz (137.6 kg)    Physical Exam   Gen: WDWN NAD HEENT: NCAT, conjunctiva not injected, sclera nonicteric CARDIAC: RRR, S1S2+, no murmur. EXT:  no edema MSK: no gross abnormalities.  NEURO: A&O x3.  CN II-XII intact.  PSYCH: normal mood. Good eye contact  Results for orders placed or performed in visit on 03/11/24  POCT HgB A1C   Collection Time: 03/11/24  4:42 PM  Result Value Ref Range   Hemoglobin A1C 8.5 (A) 4.0 - 5.6 %   HbA1c POC (<> result, manual entry)     HbA1c, POC (prediabetic range)  HbA1c, POC (controlled diabetic range)         Assessment & Plan:  Controlled type 2 diabetes mellitus without complication, without long-term current use of insulin (HCC) -     POCT glycosylated hemoglobin (Hb A1C)  Essential hypertension  Elevated PSA  Other orders -     amLODIPine  Besylate; Take 1 tablet (5 mg total) by mouth daily.  Dispense: 90 tablet; Refill: 1  Assessment and Plan Assessment & Plan Type 2 diabetes mellitus, uncontrolled   Type 2 diabetes mellitus is uncontrolled with an A1c of 8.5%, up from 7.3% three months ago. He has not been taking metformin  due to concerns about diarrhea. Recent cortisone injections may have contributed to the elevated A1c. His diet includes fruit, pizza, and sandwiches, and he lacks regular exercise due to back pain. He is resistant to taking additional medications. Discuss the importance of controlling blood glucose levels and potential complications of uncontrolled diabetes. Encourage dietary modifications, focusing on reducing starches and monitoring fruit intake. Encourage regular physical activity, such as short walks or bike rides, as tolerated. Recheck A1c at the  end of August or early September, as insurance allows.  Chronic back pain   Chronic back pain has been severe over the past month. He has received three injections and is taking cyclobenzaprine . The pain has shifted from one side to the other, affecting daily activities such as showering. Continue current pain management regimen, including cyclobenzaprine . Encourage physical therapy techniques for pain management and mobility. Discuss the impact of back pain on physical activity and encourage gradual increase in activity as tolerated.  Hypertension, controlled   Hypertension is well-controlled at home with blood pressure readings around 128/76 to 130/84 mmHg. He experiences elevated readings in the office, likely due to white coat syndrome. Continue current antihypertensive regimen, including amlodipine  and metoprolol . Monitor blood pressure at home regularly.  Elevated psa-needs to sch w/urol    Return in about 3 months (around 06/11/2024) for DM.  Ellsworth Haas, MD

## 2024-03-11 NOTE — Patient Instructions (Signed)
 It was very nice to see you today!  Work on some exercise, diet.     PLEASE NOTE:  If you had any lab tests please let us  know if you have not heard back within a few days. You may see your results on MyChart before we have a chance to review them but we will give you a call once they are reviewed by us . If we ordered any referrals today, please let us  know if you have not heard from their office within the next week.   Please try these tips to maintain a healthy lifestyle:  Eat most of your calories during the day when you are active. Eliminate processed foods including packaged sweets (pies, cakes, cookies), reduce intake of potatoes, white bread, white pasta, and white rice. Look for whole grain options, oat flour or almond flour.  Each meal should contain half fruits/vegetables, one quarter protein, and one quarter carbs (no bigger than a computer mouse).  Cut down on sweet beverages. This includes juice, soda, and sweet tea. Also watch fruit intake, though this is a healthier sweet option, it still contains natural sugar! Limit to 3 servings daily.  Drink at least 1 glass of water  with each meal and aim for at least 8 glasses per day  Exercise at least 150 minutes every week.

## 2024-03-30 ENCOUNTER — Other Ambulatory Visit: Payer: Self-pay | Admitting: Family Medicine

## 2024-04-12 ENCOUNTER — Encounter: Payer: Self-pay | Admitting: Family Medicine

## 2024-04-12 ENCOUNTER — Ambulatory Visit: Admitting: Family Medicine

## 2024-04-12 VITALS — BP 150/90 | HR 64 | Temp 98.4°F | Resp 18 | Ht 76.0 in | Wt 292.4 lb

## 2024-04-12 DIAGNOSIS — L720 Epidermal cyst: Secondary | ICD-10-CM | POA: Diagnosis not present

## 2024-04-12 NOTE — Patient Instructions (Signed)
 Monitor.  Bacitracin  No peroxide  Worse, let me know  Warm cloth soaks few times/day

## 2024-04-12 NOTE — Progress Notes (Signed)
 Subjective:     Patient ID: Maurice Golden, male    DOB: 03-30-1961, 63 y.o.   MRN: [id number]  Chief Complaint  Patient presents with   Bump on back    Bump on back, noticed on Friday, felt a drip on Saturday, bump busted    HPI Discussed the use of AI scribe software for clinical note transcription with the patient, who gave verbal consent to proceed.  History of Present Illness Maurice Golden is a 63 year old male who presents with a draining bump on his back.  He noticed a bump on his back last Thursday, which he had not been aware of previously. On Saturday, he felt something wet on his back after turning in bed and realized that the bump had ruptured. His neighbor has been assisting him by cleaning the area with peroxide and squeezing out the contents nightly since then. He has been applying bacitracin and a bandage to the area. He is unable to see the bump himself and relies on touch to assess it. No current pain and he is able to perform all activities without discomfort.  He has a history of elevated blood pressure, with home readings not exceeding 134/82 mmHg. He stayed indoors over the past week due to high outdoor temperatures, which helped him relax.    Health Maintenance Due  Topic Date Due   Medicare Annual Wellness (AWV)  Never done   FOOT EXAM  Never done   OPHTHALMOLOGY EXAM  Never done    Past Medical History:  Diagnosis Date   Allergy    ANEMIA    PATIENT DENIES   Arthritis    Bradycardia    Cataract    Diabetes mellitus without complication (HCC)    Hypertension    Impaired fasting glucose 03/11/2024   Long term (current) use of anticoagulants 03/11/2024   Normocytic anemia 03/11/2024   Obesity    OSA (obstructive sleep apnea)    cpap    Osteoarthritis of both knees 03/11/2024   Osteoarthritis of knee 03/11/2024   Otitis externa of left ear 03/11/2024   Paroxysmal atrial fibrillation (HCC)    Prediabetes 03/11/2024   Sleep disturbance  03/11/2024   Vertigo 03/11/2024    Past Surgical History:  Procedure Laterality Date   ABLATION  01/26/2014   PVI by Dr Kelsie   APPENDECTOMY     ATRIAL FIBRILLATION ABLATION [name] 01/26/2014   Procedure: ATRIAL FIBRILLATION ABLATION;  Surgeon: Lynwood JONETTA Kelsie, MD;  Location: [facility] CATH LAB;  Service: Cardiovascular;  Laterality: [name];   BILATERAL KNEE ARTHROSCOPY     CARDIOVERSION  04/29/2012   Procedure: CARDIOVERSION;  Surgeon: Aleene JINNY Passe, MD;  Location: [facility] ENDOSCOPY;  Service: Cardiovascular;  Laterality: [name];   CARDIOVERSION [name] 12/21/2013   Procedure: CARDIOVERSION;  Surgeon: Maude JAYSON Emmer, MD;  Location: [facility] ENDOSCOPY;  Service: Cardiovascular;  Laterality: [name];   CARDIOVERSION [name] 05/30/2014   Procedure: CARDIOVERSION;  Surgeon: Ezra GORMAN Shuck, MD;  Location: [facility] ENDOSCOPY;  Service: Cardiovascular;  Laterality: [name];   CARDIOVERSION [name] 04/26/2015   Procedure: CARDIOVERSION;  Surgeon: Vinie JAYSON Maxcy, MD;  Location: [facility] ENDOSCOPY;  Service: Cardiovascular;  Laterality: [name];   CARDIOVERSION [name] 07/21/2017   Procedure: CARDIOVERSION;  Surgeon: Maxcy Vinie JAYSON, MD;  Location: [facility] ENDOSCOPY;  Service: Cardiovascular;  Laterality: [name];   CATARACT EXTRACTION Right 2012   EYE SURGERY     JOINT REPLACEMENT     L eye cataract     bilat  TEE WITHOUT CARDIOVERSION  04/29/2012   Procedure: TRANSESOPHAGEAL ECHOCARDIOGRAM (TEE);  Surgeon: Aleene JINNY Passe, MD;  Location: [facility] ENDOSCOPY;  Service: Cardiovascular;  Laterality: [name];  changed from nish to nahser/dl   TEE WITHOUT CARDIOVERSION [name] 01/25/2014   Procedure: TRANSESOPHAGEAL ECHOCARDIOGRAM (TEE);  Surgeon: Vinie KYM Maxcy, MD;  Location: [facility] ENDOSCOPY;  Service: Cardiovascular;  Laterality: [name];   TOTAL KNEE ARTHROPLASTY Right 10/27/2016   Procedure: TOTAL KNEE ARTHROPLASTY;  Surgeon: Yvone Rush, MD;  Location: [facility] OR;  Service: Orthopedics;  Laterality: Right;   TOTAL KNEE ARTHROPLASTY Left 06/08/2020   Procedure: TOTAL KNEE  ARTHROPLASTY;  Surgeon: Yvone Rush, MD;  Location: [facility] ORS;  Service: Orthopedics;  Laterality: Left;     Current Outpatient Medications:    acetaminophen  (TYLENOL ) 500 MG tablet, as needed., Disp: , Rfl:    amLODipine  (NORVASC ) 5 MG tablet, Take 1 tablet (5 mg total) by mouth daily., Disp: 90 tablet, Rfl: 1   cyclobenzaprine  (FLEXERIL ) 10 MG tablet, SMARTSIG:1 Tablet(s) By Mouth 1-3 Times Daily PRN, Disp: , Rfl:    ELDERBERRY PO, Take 2 tablets by mouth daily., Disp: , Rfl:    metoprolol  succinate (TOPROL -XL) 25 MG 24 hr tablet, TAKE 1 TABLET (25 MG TOTAL) BY MOUTH DAILY., Disp: 90 tablet, Rfl: 3   Multiple Vitamin (MULTIVITAMIN WITH MINERALS) TABS tablet, Take 3 tablets by mouth daily., Disp: , Rfl:    XARELTO  20 MG TABS tablet, TAKE 1 TABLET BY MOUTH DAILY WITH SUPPER, Disp: 90 tablet, Rfl: 1  Allergies  Allergen Reactions   Hydrocodone  Itching and Other (See Comments)    Severe itching   Other Itching and Other (See Comments)    All narcotics - Severe itching    ROS neg/noncontributory except as noted HPI/below      Objective:     BP (!) 150/90 (BP Location: Left Arm, Patient Position: Sitting, Cuff Size: Large)   Pulse 64   Temp 98.4 F (36.9 C) (Temporal)   Resp 18   Ht 6' 4 (1.93 m)   Wt 292 lb 6 oz (132.6 kg)   SpO2 96%   BMI 35.59 kg/m  Wt Readings from Last 3 Encounters:  04/12/24 292 lb 6 oz (132.6 kg)  03/11/24 (!) 300 lb 12.8 oz (136.4 kg)  12/15/23 (!) 303 lb 4 oz (137.6 kg)    Physical Exam   Gen: WDWN NAD HEENT: NCAT, conjunctiva not injected, sclera nonicteric MSK: no gross abnormalities.  NEURO: A&O x3.  CN II-XII intact.  PSYCH: normal mood. Good eye contact Approx 1.5 cm fluctuant, draining cyst mid back. Nt  Procedure:  Incision and drainage of abscess Risks, benefits, and alternatives explained and consent obtained. Time out conducted. Surface cleaned with betadine . 2 cc lidocaine  with epinephine infiltrated around cyst Adequate  anesthesia ensured. Area prepped and draped in a sterile fashion. #11 blade used to make a stab incision into abscess. Pus expressed with pressure and a lot of sebaceous material. Curved hemostat used to explore 4 quadrants and loculations broken up. Further sebum expressed.SABRA Hemostasis achieved. Pt stable. Large band-aid drsg applied Aftercare and follow-up advised.  Pt tolerated well      Assessment & Plan:  Ruptured sebaceous cyst  Assessment and Plan Assessment & Plan Sebaceous cyst   He has a non-painful bump on his back, noticed on Thursday, which ruptured and drained sebaceous material. The lesion is about an inch across with a larger hole, releasing a significant amount of sebaceous material and some bleeding upon expression. A noticeable  odor indicates the need for removal of the material. Numb the area and make a small incision to express the cyst's contents. Dress with a Band-Aid. Advise him to keep the area clean and dry. Monitor for signs of infection.  Hypertension   Home blood pressure readings are consistently below 134/82 mmHg, within an acceptable range. However, the in-office reading was 150/90 mmHg, attributed to situational factors. He has adhered to lifestyle modifications, resulting in an 8-pound weight loss, contributing to better blood pressure control. Continue monitoring blood pressure at home. Maintain current lifestyle modifications to support blood pressure control.    Return if symptoms worsen or fail to improve.  Jenkins CHRISTELLA Carrel, MD

## 2024-04-24 ENCOUNTER — Other Ambulatory Visit: Payer: Self-pay | Admitting: Cardiology

## 2024-06-15 ENCOUNTER — Ambulatory Visit: Admitting: Family Medicine

## 2024-06-15 ENCOUNTER — Encounter: Payer: Self-pay | Admitting: Family Medicine

## 2024-06-15 VITALS — BP 144/84 | HR 66 | Temp 98.2°F | Resp 18 | Ht 76.0 in | Wt 293.1 lb

## 2024-06-15 DIAGNOSIS — L720 Epidermal cyst: Secondary | ICD-10-CM

## 2024-06-15 DIAGNOSIS — I480 Paroxysmal atrial fibrillation: Secondary | ICD-10-CM | POA: Diagnosis not present

## 2024-06-15 DIAGNOSIS — Z1159 Encounter for screening for other viral diseases: Secondary | ICD-10-CM

## 2024-06-15 DIAGNOSIS — E119 Type 2 diabetes mellitus without complications: Secondary | ICD-10-CM

## 2024-06-15 DIAGNOSIS — I10 Essential (primary) hypertension: Secondary | ICD-10-CM

## 2024-06-15 NOTE — Progress Notes (Signed)
 Subjective:     Patient ID: Maurice Golden, male    DOB: 02-25-1961, 63 y.o.   MRN: [id number]  Chief Complaint  Patient presents with   Medical Management of Chronic Issues    3 month follow-up on dm Has place on back that has gotten bigger    HPI Discussed the use of AI scribe software for clinical note transcription with the patient, who gave verbal consent to proceed.  History of Present Illness Maurice Golden is a 63 year old male with hypertension and diabetes who presents for follow-up on his blood pressure and diabetes management.  His blood pressure readings are stable, ranging from 126/78 to 132/83 mmHg. No headaches, dizziness, chest pain, shortness of breath, or leg swelling. He is taking amlodipine  5 mg and metoprolol  25 mg daily.  For diabetes management, he is adhering to dietary recommendations and has lost approximately eight pounds by reducing fruit intake. He cuts apples into smaller pieces to feel fuller. 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  He has a persistent skin issue on his back, described as a 'little baby pimple' that leaks slightly and requires frequent Band-Aid changes to prevent staining his shirts. His neighbor assists with cleaning and bandaging the area. No pain associated with the lesion, and it has improved but is not fully healed. Had done I&D sev mo ago  He continues to take Xarelto  every night and uses a CPAP machine consistently for sleep apnea. He needs to f/u cardiologist  He declines receiving a flu shot and mentions a recent eye exam on [address] He is due for a cardiologist appointment and is declining a colonoscopy in the future.    Health Maintenance Due  Topic Date Due   FOOT EXAM  Never done   OPHTHALMOLOGY EXAM  Never done   Hepatitis C Screening  Never done    Past Medical History:  Diagnosis Date   Allergy    ANEMIA    PATIENT DENIES    Arthritis    Bradycardia    Cataract    Diabetes mellitus without complication (HCC)    Hypertension    Impaired fasting glucose 03/11/2024   Long term (current) use of anticoagulants 03/11/2024   Normocytic anemia 03/11/2024   Obesity    OSA (obstructive sleep apnea)    cpap    Osteoarthritis of both knees 03/11/2024   Osteoarthritis of knee 03/11/2024   Otitis externa of left ear 03/11/2024   Paroxysmal atrial fibrillation (HCC)    Prediabetes 03/11/2024   Sleep disturbance 03/11/2024   Vertigo 03/11/2024    Past Surgical History:  Procedure Laterality Date   ABLATION  01/26/2014   PVI by Dr Kelsie   APPENDECTOMY     ATRIAL FIBRILLATION ABLATION [name] 01/26/2014   Procedure: ATRIAL FIBRILLATION ABLATION;  Surgeon: Lynwood JONETTA Kelsie, MD;  Location: [facility] CATH LAB;  Service: Cardiovascular;  Laterality: [name];   BILATERAL KNEE ARTHROSCOPY     CARDIOVERSION  04/29/2012   Procedure: CARDIOVERSION;  Surgeon: Aleene JINNY Passe, MD;  Location: [facility] ENDOSCOPY;  Service: Cardiovascular;  Laterality: [name];   CARDIOVERSION [name] 12/21/2013   Procedure: CARDIOVERSION;  Surgeon: Maude JAYSON Emmer, MD;  Location: [facility] ENDOSCOPY;  Service: Cardiovascular;  Laterality: [name];   CARDIOVERSION [name] 05/30/2014   Procedure: CARDIOVERSION;  Surgeon: Ezra GORMAN Shuck, MD;  Location: [facility] ENDOSCOPY;  Service: Cardiovascular;  Laterality: [name];   CARDIOVERSION [name] 04/26/2015   Procedure: CARDIOVERSION;  Surgeon: Vinie JAYSON Maxcy, MD;  Location: [facility] ENDOSCOPY;  Service: Cardiovascular;  Laterality: [name];   CARDIOVERSION [name] 07/21/2017   Procedure: CARDIOVERSION;  Surgeon: Maxcy Vinie JAYSON, MD;  Location: [facility] ENDOSCOPY;  Service: Cardiovascular;  Laterality: [name];   CATARACT EXTRACTION Right 2012   EYE SURGERY     JOINT REPLACEMENT     L eye cataract     bilat   TEE WITHOUT CARDIOVERSION  04/29/2012   Procedure: TRANSESOPHAGEAL ECHOCARDIOGRAM (TEE);  Surgeon: Aleene JINNY Passe, MD;  Location: [facility] ENDOSCOPY;  Service: Cardiovascular;   Laterality: [name];  changed from nish to nahser/dl   TEE WITHOUT CARDIOVERSION [name] 01/25/2014   Procedure: TRANSESOPHAGEAL ECHOCARDIOGRAM (TEE);  Surgeon: Vinie KYM Maxcy, MD;  Location: [facility] ENDOSCOPY;  Service: Cardiovascular;  Laterality: [name];   TOTAL KNEE ARTHROPLASTY Right 10/27/2016   Procedure: TOTAL KNEE ARTHROPLASTY;  Surgeon: Yvone Rush, MD;  Location: [facility] OR;  Service: Orthopedics;  Laterality: Right;   TOTAL KNEE ARTHROPLASTY Left 06/08/2020   Procedure: TOTAL KNEE ARTHROPLASTY;  Surgeon: Yvone Rush, MD;  Location: [facility] ORS;  Service: Orthopedics;  Laterality: Left;     Current Outpatient Medications:    acetaminophen  (TYLENOL ) 500 MG tablet, as needed., Disp: , Rfl:    amLODipine  (NORVASC ) 5 MG tablet, Take 1 tablet (5 mg total) by mouth daily., Disp: 90 tablet, Rfl: 1   cyclobenzaprine  (FLEXERIL ) 10 MG tablet, SMARTSIG:1 Tablet(s) By Mouth 1-3 Times Daily PRN, Disp: , Rfl:    ELDERBERRY PO, Take 2 tablets by mouth daily., Disp: , Rfl:    metoprolol  succinate (TOPROL -XL) 25 MG 24 hr tablet, TAKE 1 TABLET (25 MG TOTAL) BY MOUTH DAILY., Disp: 90 tablet, Rfl: 0   Multiple Vitamin (MULTIVITAMIN WITH MINERALS) TABS tablet, Take 3 tablets by mouth daily., Disp: , Rfl:    XARELTO  20 MG TABS tablet, TAKE 1 TABLET BY MOUTH DAILY WITH SUPPER, Disp: 90 tablet, Rfl: 1  Allergies  Allergen Reactions   Hydrocodone  Itching and Other (See Comments)    Severe itching   Other Itching and Other (See Comments)    All narcotics - Severe itching    ROS neg/noncontributory except as noted HPI/below      Objective:     BP (!) 144/84   Pulse 66   Temp 98.2 F (36.8 C) (Temporal)   Resp 18   Ht 6' 4 (1.93 m)   Wt 293 lb 2 oz (133 kg)   SpO2 96%   BMI 35.68 kg/m  Wt Readings from Last 3 Encounters:  06/15/24 293 lb 2 oz (133 kg)  04/12/24 292 lb 6 oz (132.6 kg)  03/11/24 (!) 300 lb 12.8 oz (136.4 kg)    Physical Exam   Gen: WDWN NAD HEENT: NCAT, conjunctiva not injected, sclera  nonicteric NECK:  supple, no thyromegaly, no nodes, no carotid bruits CARDIAC: RRR, S1S2+, no murmur. DP 2+B LUNGS: CTAB. No wheezes ABDOMEN:  BS+, soft, NTND, No HSM, no masses EXT:  no edema MSK: no gross abnormalities.  NEURO: A&O x3.  CN II-XII intact.  PSYCH: normal mood. Good eye contact Non healing spot from sebacous cyst. Only serosang fluid. Not fluctuant.     Assessment & Plan:  Paroxysmal atrial fibrillation (HCC) -     CBC with Differential/Platelet  Controlled type 2 diabetes mellitus without complication, without long-term current use of insulin (HCC) -     Comprehensive metabolic panel with GFR -     Hemoglobin A1c  Essential hypertension -     CBC with  Differential/Platelet  Screening for viral disease -     Hepatitis C antibody  Ruptured sebaceous cyst  Assessment and Plan Assessment & Plan Essential hypertension   Blood pressure is well-controlled, ranging from 126/78 to 132/83 mmHg, with no symptoms of headache, dizziness, chest pain, shortness of breath, or leg swelling. Continue amlodipine  5 mg daily and metoprolol  25 mg daily.  Type 2 diabetes mellitus   He adheres to dietary recommendations and has lost approximately 8.5 pounds. A1c levels had worsened, possibly due to recent steroid injections for back pain. He is not engaging in regular exercise. Encourage daily exercise and continue dietary modifications, including reducing excessive fruit intake. Check labs  Paroxysmal atrial fibrillation   He continues Xarelto  as prescribed with no new symptoms reported. Continue Xarelto  as prescribed.  Chronic back cyst   The cyst is not painful but occasionally leaks fluid and is not healing completely, possibly due to being a cyst. There are no signs of infection. A dermatology appointment is scheduled for October 20th. Refer to a dermatologist for further evaluation. Continue current wound care regimen with a heated towel and gauze.    Return in about 3  months (around 09/14/2024) for chronic follow-up.  Jenkins CHRISTELLA Carrel, MD

## 2024-06-16 ENCOUNTER — Ambulatory Visit: Payer: Self-pay | Admitting: Family Medicine

## 2024-06-16 LAB — COMPREHENSIVE METABOLIC PANEL WITH GFR
ALT: 12 U/L (ref 0–53)
AST: 17 U/L (ref 0–37)
Albumin: 4.2 g/dL (ref 3.5–5.2)
Alkaline Phosphatase: 65 U/L (ref 39–117)
BUN: 17 mg/dL (ref 6–23)
CO2: 27 meq/L (ref 19–32)
Calcium: 9.5 mg/dL (ref 8.4–10.5)
Chloride: 103 meq/L (ref 96–112)
Creatinine, Ser: 0.95 mg/dL (ref 0.40–1.50)
GFR: 85.09 mL/min (ref >60.00)
Glucose, Bld: 91 mg/dL (ref 70–99)
Potassium: 4 meq/L (ref 3.5–5.1)
Sodium: 138 meq/L (ref 135–145)
Total Bilirubin: 0.7 mg/dL (ref 0.2–1.2)
Total Protein: 7.9 g/dL (ref 6.0–8.3)

## 2024-06-16 LAB — CBC WITH DIFFERENTIAL/PLATELET
Basophils Absolute: 0 K/uL (ref 0.0–0.1)
Basophils Relative: 0.7 % (ref 0.0–3.0)
Eosinophils Absolute: 0.1 K/uL (ref 0.0–0.7)
Eosinophils Relative: 2 % (ref 0.0–5.0)
HCT: 35.5 % — ABNORMAL LOW (ref 39.0–52.0)
Hemoglobin: 11.8 g/dL — ABNORMAL LOW (ref 13.0–17.0)
Lymphocytes Relative: 35.1 % (ref 12.0–46.0)
Lymphs Abs: 1.9 K/uL (ref 0.7–4.0)
MCHC: 33.2 g/dL (ref 30.0–36.0)
MCV: 84.3 fl (ref 78.0–100.0)
Monocytes Absolute: 0.6 K/uL (ref 0.1–1.0)
Monocytes Relative: 10.1 % (ref 3.0–12.0)
Neutro Abs: 2.9 K/uL (ref 1.4–7.7)
Neutrophils Relative %: 52.1 % (ref 43.0–77.0)
Platelets: 346 K/uL (ref 150.0–400.0)
RBC: 4.21 Mil/uL — ABNORMAL LOW (ref 4.22–5.81)
RDW: 15.2 % (ref 11.5–15.5)
WBC: 5.5 K/uL (ref 4.0–10.5)

## 2024-06-16 LAB — HEMOGLOBIN A1C: Hgb A1c MFr Bld: 7.2 % — ABNORMAL HIGH (ref 4.6–6.5)

## 2024-06-16 LAB — HEPATITIS C ANTIBODY: Hepatitis C Ab: NONREACTIVE

## 2024-06-16 NOTE — Progress Notes (Signed)
 Hgb stable Sugars better but goal is <7 so keep working on it a little harder and get some more exercise

## 2024-07-01 ENCOUNTER — Telehealth: Payer: Self-pay | Admitting: *Deleted

## 2024-07-01 NOTE — Telephone Encounter (Signed)
 Copied from CRM [id number]. Topic: General - Other >> Jul 01, 2024  2:08 PM Mercedes MATSU wrote: Reason for CRM: [facility] requesting patients last appointment that he had the date, and blood pressure readings from that day and if he has any upcoming appointments as well,  [employer] Dietician for [facility] [phone]  Spoke with [employer] and confirmed information requested.

## 2024-08-01 ENCOUNTER — Telehealth: Payer: Self-pay | Admitting: Family Medicine

## 2024-08-01 ENCOUNTER — Ambulatory Visit: Admitting: Physician Assistant

## 2024-08-01 ENCOUNTER — Other Ambulatory Visit: Payer: Self-pay | Admitting: Family Medicine

## 2024-08-01 MED ORDER — RIVAROXABAN 20 MG PO TABS: 20.0000 mg | ORAL_TABLET | Freq: Every day | ORAL | 1 refills | Status: AC

## 2024-08-01 NOTE — Telephone Encounter (Signed)
 Patient is requesting a refill or new script be sent to his pharmacy [facility] on [address]... for his Xarelto  .. He states he is completely out of the med and missed his first dose yesterday-07/31/2024...  Thank you Darice FORBES [facility] AWV TEAM Direct Dial [phone]

## 2024-08-02 ENCOUNTER — Telehealth: Payer: Self-pay

## 2024-08-02 NOTE — Telephone Encounter (Signed)
 Copied from CRM #[id number]. Topic: Referral - Question >> Aug 02, 2024 11:34 AM [name] wrote: Reason for CRM: Patient would like another referral to a different dermatologist because the one he was referred to is booking into May  Spoke with patient to confirm are for Dermatology referral. Pt only wants [place] area.

## 2024-08-02 NOTE — Telephone Encounter (Signed)
Spoke with patient. Meds were picked up.

## 2024-08-03 NOTE — Telephone Encounter (Signed)
**Note De-identified  [place] Obfuscation** Please advise 

## 2024-08-16 ENCOUNTER — Ambulatory Visit

## 2024-08-17 ENCOUNTER — Ambulatory Visit (INDEPENDENT_AMBULATORY_CARE_PROVIDER_SITE_OTHER)

## 2024-08-17 VITALS — Ht 76.0 in | Wt 293.0 lb

## 2024-08-17 DIAGNOSIS — Z0000 Encounter for general adult medical examination without abnormal findings: Secondary | ICD-10-CM | POA: Diagnosis not present

## 2024-08-17 NOTE — Progress Notes (Signed)
 Subjective:   Maurice Golden is a 63 y.o. male who presents for a Medicare Annual Wellness Visit.  I connected with  Maurice Golden on 08/17/24 by a audio enabled telemedicine application and verified that I am speaking with the correct person using two identifiers.  Patient Location: Home  Provider Location: Home Office  I discussed the limitations of evaluation and management by telemedicine. The patient expressed understanding and agreed to proceed.   Allergies (verified) Hydrocodone  and Other   History: Past Medical History:  Diagnosis Date   Allergy    ANEMIA    PATIENT DENIES   Arthritis    Bradycardia    Cataract    Diabetes mellitus without complication (HCC)    Hypertension    Impaired fasting glucose 03/11/2024   Long term (current) use of anticoagulants 03/11/2024   Normocytic anemia 03/11/2024   Obesity    OSA (obstructive sleep apnea)    cpap    Osteoarthritis of both knees 03/11/2024   Osteoarthritis of knee 03/11/2024   Otitis externa of left ear 03/11/2024   Paroxysmal atrial fibrillation (HCC)    Prediabetes 03/11/2024   Sleep disturbance 03/11/2024   Vertigo 03/11/2024   Past Surgical History:  Procedure Laterality Date   ABLATION  01/26/2014   PVI by Dr Kelsie   APPENDECTOMY     ATRIAL FIBRILLATION ABLATION [name] 01/26/2014   Procedure: ATRIAL FIBRILLATION ABLATION;  Surgeon: Lynwood JONETTA Kelsie, MD;  Location: [facility] CATH LAB;  Service: Cardiovascular;  Laterality: [name];   BILATERAL KNEE ARTHROSCOPY     CARDIOVERSION  04/29/2012   Procedure: CARDIOVERSION;  Surgeon: Aleene JINNY Passe, MD;  Location: [facility] ENDOSCOPY;  Service: Cardiovascular;  Laterality: [name];   CARDIOVERSION [name] 12/21/2013   Procedure: CARDIOVERSION;  Surgeon: Maude JAYSON Emmer, MD;  Location: [facility] ENDOSCOPY;  Service: Cardiovascular;  Laterality: [name];   CARDIOVERSION [name] 05/30/2014   Procedure: CARDIOVERSION;  Surgeon: Ezra GORMAN Shuck, MD;  Location: [facility] ENDOSCOPY;  Service:  Cardiovascular;  Laterality: [name];   CARDIOVERSION [name] 04/26/2015   Procedure: CARDIOVERSION;  Surgeon: Vinie JAYSON Maxcy, MD;  Location: [facility] ENDOSCOPY;  Service: Cardiovascular;  Laterality: [name];   CARDIOVERSION [name] 07/21/2017   Procedure: CARDIOVERSION;  Surgeon: Maxcy Vinie JAYSON, MD;  Location: [facility] ENDOSCOPY;  Service: Cardiovascular;  Laterality: [name];   CATARACT EXTRACTION Right 2012   EYE SURGERY     JOINT REPLACEMENT     L eye cataract     bilat   TEE WITHOUT CARDIOVERSION  04/29/2012   Procedure: TRANSESOPHAGEAL ECHOCARDIOGRAM (TEE);  Surgeon: Aleene JINNY Passe, MD;  Location: [facility] ENDOSCOPY;  Service: Cardiovascular;  Laterality: [name];  changed from Maurice Golden to Maurice Golden/Maurice Golden   TEE WITHOUT CARDIOVERSION [name] 01/25/2014   Procedure: TRANSESOPHAGEAL ECHOCARDIOGRAM (TEE);  Surgeon: Vinie KYM Maxcy, MD;  Location: [facility] ENDOSCOPY;  Service: Cardiovascular;  Laterality: [name];   TOTAL KNEE ARTHROPLASTY Right 10/27/2016   Procedure: TOTAL KNEE ARTHROPLASTY;  Surgeon: Maurice Norleen, MD;  Location: [facility] OR;  Service: Orthopedics;  Laterality: Right;   TOTAL KNEE ARTHROPLASTY Left 06/08/2020   Procedure: TOTAL KNEE ARTHROPLASTY;  Surgeon: Maurice Norleen, MD;  Location: [facility] ORS;  Service: Orthopedics;  Laterality: Left;   Family History  Problem Relation Age of Onset   Sarcoidosis Mother    Kidney disease Mother    Arthritis Mother    Diabetes Mother    Hypertension Mother    Hypertension Father    Sarcoidosis Brother    Social History   Occupational History   Occupation: retired  Tobacco Use   Smoking status: Never   Smokeless tobacco: Never  Vaping Use   Vaping status: Never Used  Substance and Sexual Activity   Alcohol use: No   Drug use: No   Sexual activity: Yes   Tobacco Counseling Counseling given: Not Answered  SDOH Screenings   Food Insecurity: Patient Declined (08/15/2024)  Housing: Unknown (08/15/2024)  Transportation Needs: No Transportation Needs (08/15/2024)  Utilities: Not At Risk  (08/17/2024)  Depression (PHQ2-9): Low Risk  (08/17/2024)  Financial Resource Strain: Low Risk  (08/15/2024)  Physical Activity: Insufficiently Active (08/17/2024)  Social Connections: Unknown (08/15/2024)  Stress: No Stress Concern Present (08/17/2024)  Tobacco Use: Low Risk  (06/15/2024)  Health Literacy: Adequate Health Literacy (08/17/2024)   Depression Screen    08/17/2024    1:10 PM 12/21/2023    2:44 PM 09/07/2023    3:17 PM 06/01/2023    2:53 PM 01/21/2023    2:04 PM  PHQ 2/9 Scores  PHQ - 2 Score 0 0 0 0 0  PHQ- 9 Score   0       Goals Addressed               This Visit's Progress     maintain health and activity (pt-stated)        Maintain health and activity        Visit info / Clinical Intake: Medicare Wellness Visit Type:: Initial Annual Wellness Visit Medicare Wellness Visit Mode:: Telephone If telephone:: video declined If telephone or video:: unable to obtan vitals due to lack of equipment Interpreter Needed?: No Pre-visit prep was completed: yes AWV questionnaire completed by patient prior to visit?: yes Date:: 08/15/24 Living arrangements:: (!) lives alone Patient's Overall Health Status Rating: very good Typical amount of pain: none Does pain affect daily life?: no Are you currently prescribed opioids?: no  Dietary Habits and Nutritional Risks How many meals a day?: 2 Eats fruit and vegetables daily?: yes Most meals are obtained by: preparing own meals; eating out Diabetic:: (!) yes Any non-healing wounds?: no How often do you check your BS?: as needed Would you like to be referred to a Nutritionist or for Diabetic Management? : no  Functional Status Activities of Daily Living (to include ambulation/medication): (Patient-Rptd) Independent Ambulation: Independent with device- listed below Home Assistive Devices/Equipment: Eyeglasses; CPAP Medication Administration: Independent Home Management: (Patient-Rptd) Independent Manage your own finances?:  yes Primary transportation is: driving Concerns about vision?: no *vision screening is required for WTM* Concerns about hearing?: no  Fall Screening Falls in the past year?: (Patient-Rptd) 0 Number of falls in past year: (Patient-Rptd) 0 Was there an injury with Fall?: (Patient-Rptd) 0 Fall Risk Category Calculator: (Patient-Rptd) 0 Patient Fall Risk Level: (Patient-Rptd) Low Fall Risk  Fall Risk Patient at Risk for Falls Due to: No Fall Risks Fall risk Follow up: Falls prevention discussed  Home and Transportation Safety: All rugs have non-skid backing?: [name], no rugs All stairs or steps have railings?: [name], no stairs Grab bars in the bathtub or shower?: (!) no Have non-skid surface in bathtub or shower?: yes Good home lighting?: yes Regular seat belt use?: yes Hospital stays in the last year:: no  Cognitive Assessment Difficulty concentrating, remembering, or making decisions? : no Will 6CIT or Mini Cog be Completed: no 6CIT or Mini Cog Declined: patient alert, oriented, able to answer questions appropriately and recall recent events  Advance Directives (For Healthcare) Does Patient Have a Medical Advance Directive?: No Would patient like information on creating  a medical advance directive?: No - Patient declined  Reviewed/Updated  Reviewed/Updated: All        Objective:    Today's Vitals   08/17/24 1304  Weight: 293 lb (132.9 kg)  Height: 6' 4 (1.93 m)   Body mass index is 35.67 kg/m.  Current Medications (verified) Outpatient Encounter Medications as of 08/17/2024  Medication Sig   acetaminophen  (TYLENOL ) 500 MG tablet as needed.   amLODipine  (NORVASC ) 5 MG tablet Take 1 tablet (5 mg total) by mouth daily.   cyclobenzaprine  (FLEXERIL ) 10 MG tablet SMARTSIG:1 Tablet(s) By Mouth 1-3 Times Daily PRN   ELDERBERRY PO Take 2 tablets by mouth daily.   metoprolol  succinate (TOPROL -XL) 25 MG 24 hr tablet TAKE 1 TABLET (25 MG TOTAL) BY MOUTH DAILY.   Multiple  Vitamin (MULTIVITAMIN WITH MINERALS) TABS tablet Take 3 tablets by mouth daily.   rivaroxaban  (XARELTO ) 20 MG TABS tablet Take 1 tablet (20 mg total) by mouth daily with supper.   No facility-administered encounter medications on file as of 08/17/2024.   Hearing/Vision screen Hearing Screening - Comments:: Pt denies any hearing issues  Vision Screening - Comments:: Wears rx glasses - up to date with routine eye exams with Dr octavia  Immunizations and Health Maintenance Health Maintenance  Topic Date Due   COVID-19 Vaccine (1) Never done   FOOT EXAM  Never done   OPHTHALMOLOGY EXAM  Never done   DTaP/Tdap/Td (2 - Td or Tdap) 09/06/2024 (Originally 01/05/2023)   Zoster Vaccines- Shingrix (1 of 2) 10/03/2024 (Originally 10/19/1979)   Influenza Vaccine  01/10/2025 (Originally 05/13/2024)   Pneumococcal Vaccine: 50+ Years (1 of 2 - PCV) 06/15/2025 (Originally 10/19/1979)   Colonoscopy  06/15/2025 (Originally 10/18/2005)   HIV Screening  06/15/2025 (Originally 10/19/1975)   Diabetic kidney evaluation - Urine ACR  12/07/2024   HEMOGLOBIN A1C  12/13/2024   Diabetic kidney evaluation - eGFR measurement  06/15/2025   Medicare Annual Wellness (AWV)  08/17/2025   Hepatitis C Screening  Completed   Hepatitis B Vaccines 19-59 Average Risk  Aged Out   HPV VACCINES  Aged Out   Meningococcal B Vaccine  Aged Out        Assessment/Plan:  This is a routine wellness examination for Ava.  Patient Care Team: Wendolyn Jenkins Jansky, MD as PCP - General (Family Medicine) Kennyth Chew, MD as PCP - Electrophysiology (Cardiology)  I have personally reviewed and noted the following in the patient's chart:   Medical and social history Use of alcohol, tobacco or illicit drugs  Current medications and supplements including opioid prescriptions. Functional ability and status Nutritional status Physical activity Advanced directives List of other physicians Hospitalizations, surgeries, and ER visits in previous 12  months Vitals Screenings to include cognitive, depression, and falls Referrals and appointments  No orders of the defined types were placed in this encounter.  In addition, I have reviewed and discussed with patient certain preventive protocols, quality metrics, and best practice recommendations. A written personalized care plan for preventive services as well as general preventive health recommendations were provided to patient.   Ellouise VEAR Haws, LPN   [id number]   Return in 1 year (on 08/17/2025).  After Visit Summary: (MyChart) Due to this being a telephonic visit, the after visit summary with patients personalized plan was offered to patient via MyChart   Nurse Notes: nothing significant at this time

## 2024-08-17 NOTE — Patient Instructions (Signed)
 Mr. Zentner,  Thank you for taking the time for your Medicare Wellness Visit. I appreciate your continued commitment to your health goals. Please review the care plan we discussed, and feel free to reach out if I can assist you further.  Please note that Annual Wellness Visits do not include a physical exam. Some assessments may be limited, especially if the visit was conducted virtually. If needed, we may recommend an in-person follow-up with your provider.  Ongoing Care Seeing your primary care provider every 3 to 6 months helps us  monitor your health and provide consistent, personalized care.   Referrals If a referral was made during today's visit and you haven't received any updates within two weeks, please contact the referred provider directly to check on the status.  Recommended Screenings:  Health Maintenance  Topic Date Due   Medicare Annual Wellness Visit  Never done   COVID-19 Vaccine (1) Never done   Complete foot exam   Never done   Eye exam for diabetics  Never done   DTaP/Tdap/Td vaccine (2 - Td or Tdap) 09/06/2024*   Zoster (Shingles) Vaccine (1 of 2) 10/03/2024*   Flu Shot  01/10/2025*   Pneumococcal Vaccine for age over 73 (1 of 2 - PCV) 06/15/2025*   Colon Cancer Screening  06/15/2025*   HIV Screening  06/15/2025*   Yearly kidney health urinalysis for diabetes  12/07/2024   Hemoglobin A1C  12/13/2024   Yearly kidney function blood test for diabetes  06/15/2025   Hepatitis C Screening  Completed   Hepatitis B Vaccine  Aged Out   HPV Vaccine  Aged Out   Meningitis B Vaccine  Aged Out  *Topic was postponed. The date shown is not the original due date.       08/15/2024   12:17 AM  Advanced Directives  Does Patient Have a Medical Advance Directive? No  Would patient like information on creating a medical advance directive? No - Patient declined    Vision: Annual vision screenings are recommended for early detection of glaucoma, cataracts, and diabetic  retinopathy. These exams can also reveal signs of chronic conditions such as diabetes and high blood pressure.  Dental: Annual dental screenings help detect early signs of oral cancer, gum disease, and other conditions linked to overall health, including heart disease and diabetes.  Please see the attached documents for additional preventive care recommendations.

## 2024-08-25 NOTE — Progress Notes (Addendum)
 Subjective:   Maurice Golden is a 63 y.o. male who presents for a Medicare Annual Wellness Visit.  I connected with  Maurice Golden on 08/25/24 by a audio enabled telemedicine application and verified that I am speaking with the correct person using two identifiers.  Patient Location: Home Pt participating in visit: patient only   Provider Location: Home Office  I discussed the limitations of evaluation and management by telemedicine. The patient expressed understanding and agreed to proceed.   Allergies (verified) Hydrocodone  and Other   History: Past Medical History:  Diagnosis Date   Allergy    ANEMIA    PATIENT DENIES   Arthritis    Bradycardia    Cataract    Diabetes mellitus without complication (HCC)    Hypertension    Impaired fasting glucose 03/11/2024   Long term (current) use of anticoagulants 03/11/2024   Normocytic anemia 03/11/2024   Obesity    OSA (obstructive sleep apnea)    cpap    Osteoarthritis of both knees 03/11/2024   Osteoarthritis of knee 03/11/2024   Otitis externa of left ear 03/11/2024   Paroxysmal atrial fibrillation (HCC)    Prediabetes 03/11/2024   Sleep disturbance 03/11/2024   Vertigo 03/11/2024   Past Surgical History:  Procedure Laterality Date   ABLATION  01/26/2014   PVI by Dr Maurice Golden   APPENDECTOMY     ATRIAL FIBRILLATION ABLATION [name] 01/26/2014   Procedure: ATRIAL FIBRILLATION ABLATION;  Surgeon: Maurice Maurice Kelsie, MD;  Location: [facility] CATH LAB;  Service: Cardiovascular;  Laterality: [name];   BILATERAL KNEE ARTHROSCOPY     CARDIOVERSION  04/29/2012   Procedure: CARDIOVERSION;  Surgeon: Maurice Maurice Passe, MD;  Location: [facility] ENDOSCOPY;  Service: Cardiovascular;  Laterality: [name];   CARDIOVERSION [name] 12/21/2013   Procedure: CARDIOVERSION;  Surgeon: Maurice Maurice Emmer, MD;  Location: [facility] ENDOSCOPY;  Service: Cardiovascular;  Laterality: [name];   CARDIOVERSION [name] 05/30/2014   Procedure: CARDIOVERSION;  Surgeon: Maurice GORMAN Shuck, MD;   Location: [facility] ENDOSCOPY;  Service: Cardiovascular;  Laterality: [name];   CARDIOVERSION [name] 04/26/2015   Procedure: CARDIOVERSION;  Surgeon: Maurice Maurice Maxcy, MD;  Location: [facility] ENDOSCOPY;  Service: Cardiovascular;  Laterality: [name];   CARDIOVERSION [name] 07/21/2017   Procedure: CARDIOVERSION;  Surgeon: Golden Maurice JAYSON, MD;  Location: [facility] ENDOSCOPY;  Service: Cardiovascular;  Laterality: [name];   CATARACT EXTRACTION Right 2012   EYE SURGERY     JOINT REPLACEMENT     L eye cataract     bilat   TEE WITHOUT CARDIOVERSION  04/29/2012   Procedure: TRANSESOPHAGEAL ECHOCARDIOGRAM (TEE);  Surgeon: Maurice Maurice Passe, MD;  Location: [facility] ENDOSCOPY;  Service: Cardiovascular;  Laterality: [name];  changed from nish to nahser/dl   TEE WITHOUT CARDIOVERSION [name] 01/25/2014   Procedure: TRANSESOPHAGEAL ECHOCARDIOGRAM (TEE);  Surgeon: Maurice Maurice Maxcy, MD;  Location: [facility] ENDOSCOPY;  Service: Cardiovascular;  Laterality: [name];   TOTAL KNEE ARTHROPLASTY Right 10/27/2016   Procedure: TOTAL KNEE ARTHROPLASTY;  Surgeon: Maurice Norleen, MD;  Location: [facility] OR;  Service: Orthopedics;  Laterality: Right;   TOTAL KNEE ARTHROPLASTY Left 06/08/2020   Procedure: TOTAL KNEE ARTHROPLASTY;  Surgeon: Maurice Norleen, MD;  Location: [facility] ORS;  Service: Orthopedics;  Laterality: Left;   Family History  Problem Relation Age of Onset   Sarcoidosis Mother    Kidney disease Mother    Arthritis Mother    Diabetes Mother    Hypertension Mother    Hypertension Father    Sarcoidosis Brother    Social History  Occupational History   Occupation: retired  Tobacco Use   Smoking status: Never   Smokeless tobacco: Never  Vaping Use   Vaping status: Never Used  Substance and Sexual Activity   Alcohol use: No   Drug use: No   Sexual activity: Yes   Tobacco Counseling Counseling given: Not Answered  SDOH Screenings   Food Insecurity: Patient Declined (08/15/2024)  Housing: Unknown (08/15/2024)  Transportation Needs: No Transportation Needs  (08/15/2024)  Utilities: Not At Risk (08/17/2024)  Depression (PHQ2-9): Low Risk  (08/17/2024)  Financial Resource Strain: Low Risk  (08/15/2024)  Physical Activity: Insufficiently Active (08/17/2024)  Social Connections: Unknown (08/15/2024)  Stress: No Stress Concern Present (08/17/2024)  Tobacco Use: Low Risk  (06/15/2024)  Health Literacy: Adequate Health Literacy (08/17/2024)   Depression Screen    08/17/2024    1:10 PM 12/21/2023    2:44 PM 09/07/2023    3:17 PM 06/01/2023    2:53 PM 01/21/2023    2:04 PM  PHQ 2/9 Scores  PHQ - 2 Score 0 0 0 0 0  PHQ- 9 Score   0        Data saved with a previous flowsheet row definition     Goals Addressed               This Visit's Progress     maintain health and activity (pt-stated)        Maintain health and activity        Visit info / Clinical Intake: Medicare Wellness Visit Type:: Initial Annual Wellness Visit Medicare Wellness Visit Mode:: Telephone If telephone:: video declined Because this visit was a virtual/telehealth visit:: unable to obtan vitals due to lack of equipment Interpreter Needed?: No Pre-visit prep was completed: yes AWV questionnaire completed by patient prior to visit?: yes Date:: 08/15/24 Living arrangements:: (!) lives alone Patient's Overall Health Status Rating: very good Typical amount of pain: none Does pain affect daily life?: no Are you currently prescribed opioids?: no  Dietary Habits and Nutritional Risks How many meals a day?: 2 Eats fruit and vegetables daily?: yes Most meals are obtained by: preparing own meals; eating out Diabetic:: (!) yes Any non-healing wounds?: no How often do you check your BS?: as needed Would you like to be referred to a Nutritionist or for Diabetic Management? : no  Functional Status Activities of Daily Living (to include ambulation/medication): (Patient-Rptd) Independent Ambulation: Independent with device- listed below Home Assistive Devices/Equipment:  Eyeglasses; CPAP Medication Administration: Independent Home Management: (Patient-Rptd) Independent Manage your own finances?: yes Primary transportation is: driving Concerns about vision?: no *vision screening is required for WTM* Concerns about hearing?: no  Fall Screening Falls in the past year?: (Patient-Rptd) 0 Number of falls in past year: (Patient-Rptd) 0 Was there an injury with Fall?: (Patient-Rptd) 0 Fall Risk Category Calculator: (Patient-Rptd) 0 Patient Fall Risk Level: (Patient-Rptd) Low Fall Risk  Fall Risk Patient at Risk for Falls Due to: No Fall Risks Fall risk Follow up: Falls prevention discussed  Home and Transportation Safety: All rugs have non-skid backing?: [name], no rugs All stairs or steps have railings?: [name], no stairs Grab bars in the bathtub or shower?: (!) no Have non-skid surface in bathtub or shower?: yes Good home lighting?: yes Regular seat belt use?: yes Hospital stays in the last year:: no  Cognitive Assessment Difficulty concentrating, remembering, or making decisions? : no Will 6CIT or Mini Cog be Completed: no 6CIT or Mini Cog Declined: patient alert, oriented, able to answer questions appropriately  and recall recent events  Advance Directives (For Healthcare) Does Patient Have a Medical Advance Directive?: No Would patient like information on creating a medical advance directive?: No - Patient declined  Reviewed/Updated  Reviewed/Updated: All        Objective:    Today's Vitals   08/17/24 1304  Weight: 293 lb (132.9 kg)  Height: 6' 4 (1.93 m)   Body mass index is 35.67 kg/m.  Current Medications (verified) Outpatient Encounter Medications as of 08/17/2024  Medication Sig   acetaminophen  (TYLENOL ) 500 MG tablet as needed.   amLODipine  (NORVASC ) 5 MG tablet Take 1 tablet (5 mg total) by mouth daily.   cyclobenzaprine  (FLEXERIL ) 10 MG tablet SMARTSIG:1 Tablet(s) By Mouth 1-3 Times Daily PRN   ELDERBERRY PO Take 2 tablets by  mouth daily.   metoprolol  succinate (TOPROL -XL) 25 MG 24 hr tablet TAKE 1 TABLET (25 MG TOTAL) BY MOUTH DAILY.   Multiple Vitamin (MULTIVITAMIN WITH MINERALS) TABS tablet Take 3 tablets by mouth daily.   rivaroxaban  (XARELTO ) 20 MG TABS tablet Take 1 tablet (20 mg total) by mouth daily with supper.   No facility-administered encounter medications on file as of 08/17/2024.   Hearing/Vision screen Hearing Screening - Comments:: Pt denies any hearing issues  Vision Screening - Comments:: Wears rx glasses - up to date with routine eye exams with Dr octavia  Immunizations and Health Maintenance Health Maintenance  Topic Date Due   COVID-19 Vaccine (1) Never done   FOOT EXAM  Never done   OPHTHALMOLOGY EXAM  Never done   DTaP/Tdap/Td (2 - Td or Tdap) 09/06/2024 (Originally 01/05/2023)   Zoster Vaccines- Shingrix (1 of 2) 10/03/2024 (Originally 10/19/1979)   Influenza Vaccine  01/10/2025 (Originally 05/13/2024)   Pneumococcal Vaccine: 50+ Years (1 of 2 - PCV) 06/15/2025 (Originally 10/19/1979)   Colonoscopy  06/15/2025 (Originally 10/18/2005)   HIV Screening  06/15/2025 (Originally 10/19/1975)   Diabetic kidney evaluation - Urine ACR  12/07/2024   HEMOGLOBIN A1C  12/13/2024   Diabetic kidney evaluation - eGFR measurement  06/15/2025   Medicare Annual Wellness (AWV)  08/17/2025   Hepatitis C Screening  Completed   Hepatitis B Vaccines 19-59 Average Risk  Aged Out   HPV VACCINES  Aged Out   Meningococcal B Vaccine  Aged Out        Assessment/Plan:  This is a routine wellness examination for Sahib.  Patient Care Team: Wendolyn Jenkins Jansky, MD as PCP - General (Family Medicine) Kennyth Chew, MD as PCP - Electrophysiology (Cardiology)  I have personally reviewed and noted the following in the patient's chart:   Medical and social history Use of alcohol, tobacco or illicit drugs  Current medications and supplements including opioid prescriptions. Functional ability and status Nutritional  status Physical activity Advanced directives List of other physicians Hospitalizations, surgeries, and ER visits in previous 12 months Vitals Screenings to include cognitive, depression, and falls Referrals and appointments  No orders of the defined types were placed in this encounter.  In addition, I have reviewed and discussed with patient certain preventive protocols, quality metrics, and best practice recommendations. A written personalized care plan for preventive services as well as general preventive health recommendations were provided to patient.   Ellouise VEAR Haws, LPN   [id number]/[id number]   Return in 1 year (on 08/17/2025).  After Visit Summary: (MyChart) Due to this being a telephonic visit, the after visit summary with patients personalized plan was offered to patient via MyChart   Nurse Notes: nothing significant at this  time

## 2024-09-20 ENCOUNTER — Ambulatory Visit: Admitting: Family Medicine

## 2024-09-20 ENCOUNTER — Encounter: Payer: Self-pay | Admitting: Family Medicine

## 2024-09-20 VITALS — BP 136/82 | HR 86 | Temp 98.1°F | Ht 76.0 in | Wt 295.4 lb

## 2024-09-20 DIAGNOSIS — N5202 Corporo-venous occlusive erectile dysfunction: Secondary | ICD-10-CM

## 2024-09-20 DIAGNOSIS — I4819 Other persistent atrial fibrillation: Secondary | ICD-10-CM

## 2024-09-20 DIAGNOSIS — E119 Type 2 diabetes mellitus without complications: Secondary | ICD-10-CM

## 2024-09-20 DIAGNOSIS — I10 Essential (primary) hypertension: Secondary | ICD-10-CM

## 2024-09-20 MED ORDER — METOPROLOL SUCCINATE ER 25 MG PO TB24: 25.0000 mg | ORAL_TABLET | Freq: Every day | ORAL | 1 refills | Status: AC

## 2024-09-20 MED ORDER — TADALAFIL 20 MG PO TABS: 10.0000 mg | ORAL_TABLET | ORAL | 11 refills | Status: AC | PRN

## 2024-09-20 MED ORDER — AMLODIPINE BESYLATE 5 MG PO TABS: 5.0000 mg | ORAL_TABLET | Freq: Every day | ORAL | 1 refills | Status: AC

## 2024-09-20 NOTE — Progress Notes (Unsigned)
 Subjective:     Patient ID: Maurice Golden, male    DOB: 1961-02-05, 63 y.o.   MRN: [id number]  Chief Complaint  Patient presents with   Diabetes   Hypertension    Pt Is here for chronic issues    Discussed the use of AI scribe software for clinical note transcription with the patient, who gave verbal consent to proceed.  History of Present Illness Maurice Golden is a 63 year old male with hypertension, atrial fibrillation, and diabetes who presents for a routine follow-up visit.  Blood pressure readings at home are typically around 128/82-85 mmHg. He is taking amlodipine  5 mg and metoprolol  25 mg for blood pressure and atrial fibrillation management. Occasionally experiences headaches lasting about two hours, which resolve without medication.  He is mindful of his diet, consuming wheat-based products such as wheat spaghetti, wheat bread, and bagels. He also eats chickpea and edamame pasta. He has reduced his intake of cookies, cakes, and candies, although he enjoys peanuts, raisins, cranberries, almonds, and walnuts. He moderates his intake of raisins and cranberries due to their sugar content. Regularly consumes vegetables, particularly broccoli, and engages in walking as exercise.  He experiences erectile dysfunction, characterized by losing an erection during intercourse. He is highly attracted to his partner and denies premature ejaculation. He has not used medications like Viagra or Cialis  and expresses concerns about their use.  He checks his heart rhythm every night when he puts on his mask, indicating possible use of a CPAP machine for sleep apnea.    Health Maintenance Due  Topic Date Due   FOOT EXAM  Never done   OPHTHALMOLOGY EXAM  Never done    Past Medical History:  Diagnosis Date   Allergy    ANEMIA    PATIENT DENIES   Arthritis    Bradycardia    Cataract    Diabetes mellitus without complication (HCC)    Hypertension    Impaired fasting glucose  03/11/2024   Long term (current) use of anticoagulants 03/11/2024   Normocytic anemia 03/11/2024   Obesity    OSA (obstructive sleep apnea)    cpap    Osteoarthritis of both knees 03/11/2024   Osteoarthritis of knee 03/11/2024   Otitis externa of left ear 03/11/2024   Paroxysmal atrial fibrillation (HCC)    Prediabetes 03/11/2024   Sleep disturbance 03/11/2024   Vertigo 03/11/2024    Past Surgical History:  Procedure Laterality Date   ABLATION  01/26/2014   PVI by Dr Kelsie   APPENDECTOMY     ATRIAL FIBRILLATION ABLATION [name] 01/26/2014   Procedure: ATRIAL FIBRILLATION ABLATION;  Surgeon: Lynwood JONETTA Kelsie, MD;  Location: [facility] CATH LAB;  Service: Cardiovascular;  Laterality: [name];   BILATERAL KNEE ARTHROSCOPY     CARDIOVERSION  04/29/2012   Procedure: CARDIOVERSION;  Surgeon: Aleene JINNY Passe, MD;  Location: [facility] ENDOSCOPY;  Service: Cardiovascular;  Laterality: [name];   CARDIOVERSION [name] 12/21/2013   Procedure: CARDIOVERSION;  Surgeon: Maude JAYSON Emmer, MD;  Location: [facility] ENDOSCOPY;  Service: Cardiovascular;  Laterality: [name];   CARDIOVERSION [name] 05/30/2014   Procedure: CARDIOVERSION;  Surgeon: Ezra GORMAN Shuck, MD;  Location: [facility] ENDOSCOPY;  Service: Cardiovascular;  Laterality: [name];   CARDIOVERSION [name] 04/26/2015   Procedure: CARDIOVERSION;  Surgeon: Vinie JAYSON Maxcy, MD;  Location: [facility] ENDOSCOPY;  Service: Cardiovascular;  Laterality: [name];   CARDIOVERSION [name] 07/21/2017   Procedure: CARDIOVERSION;  Surgeon: Maxcy Vinie JAYSON, MD;  Location: [facility] ENDOSCOPY;  Service: Cardiovascular;  Laterality: [name];   CATARACT EXTRACTION  Right 2012   EYE SURGERY     JOINT REPLACEMENT     L eye cataract     bilat   TEE WITHOUT CARDIOVERSION  04/29/2012   Procedure: TRANSESOPHAGEAL ECHOCARDIOGRAM (TEE);  Surgeon: Aleene JINNY Passe, MD;  Location: [facility] ENDOSCOPY;  Service: Cardiovascular;  Laterality: [name];  changed from nish to nahser/dl   TEE WITHOUT CARDIOVERSION [name] 01/25/2014   Procedure: TRANSESOPHAGEAL  ECHOCARDIOGRAM (TEE);  Surgeon: Vinie KYM Maxcy, MD;  Location: [facility] ENDOSCOPY;  Service: Cardiovascular;  Laterality: [name];   TOTAL KNEE ARTHROPLASTY Right 10/27/2016   Procedure: TOTAL KNEE ARTHROPLASTY;  Surgeon: Yvone Rush, MD;  Location: [facility] OR;  Service: Orthopedics;  Laterality: Right;   TOTAL KNEE ARTHROPLASTY Left 06/08/2020   Procedure: TOTAL KNEE ARTHROPLASTY;  Surgeon: Yvone Rush, MD;  Location: [facility] ORS;  Service: Orthopedics;  Laterality: Left;     Current Outpatient Medications:    acetaminophen  (TYLENOL ) 500 MG tablet, as needed., Disp: , Rfl:    ELDERBERRY PO, Take 2 tablets by mouth daily., Disp: , Rfl:    Multiple Vitamin (MULTIVITAMIN WITH MINERALS) TABS tablet, Take 3 tablets by mouth daily., Disp: , Rfl:    rivaroxaban  (XARELTO ) 20 MG TABS tablet, Take 1 tablet (20 mg total) by mouth daily with supper., Disp: 90 tablet, Rfl: 1   tadalafil  (CIALIS ) 20 MG tablet, Take 0.5-1 tablets (10-20 mg total) by mouth every other day as needed for erectile dysfunction., Disp: 10 tablet, Rfl: 11   amLODipine  (NORVASC ) 5 MG tablet, Take 1 tablet (5 mg total) by mouth daily., Disp: 90 tablet, Rfl: 1   metoprolol  succinate (TOPROL -XL) 25 MG 24 hr tablet, Take 1 tablet (25 mg total) by mouth daily., Disp: 90 tablet, Rfl: 1  Allergies  Allergen Reactions   Hydrocodone  Itching and Other (See Comments)    Severe itching   Other Itching and Other (See Comments)    All narcotics - Severe itching    ROS neg/noncontributory except as noted HPI/below      Objective:     BP 136/82 (Cuff Size: Large)   Pulse 86   Temp 98.1 F (36.7 C) (Temporal)   Ht 6' 4 (1.93 m)   Wt 295 lb 6 oz (134 kg)   BMI 35.95 kg/m  Wt Readings from Last 3 Encounters:  09/20/24 295 lb 6 oz (134 kg)  08/17/24 293 lb (132.9 kg)  06/15/24 293 lb 2 oz (133 kg)    Physical Exam VITALS: BP- 123/82 GENERAL: Well developed, well nourished, no acute distress. HEAD EYES EARS NOSE THROAT: Normocephalic,  atraumatic, conjunctiva not injected, sclera nonicteric. CARDIAC: Regular rate and rhythm, S1 S2 present, no murmur, dorsalis pedis 2 plus bilaterally. LUNGS: Clear to auscultation bilaterally, no wheezes. ABDOMEN: Bowel sounds present, soft, non-tender, non-distended, no hepatosplenomegaly, no masses. EXTREMITIES: No edema. MUSCULOSKELETAL: No gross abnormalities. NEUROLOGICAL: Alert and oriented x3, cranial nerves II through XII intact. PSYCHIATRIC: Normal mood, good eye contact.       Assessment & Plan:  Essential hypertension -     amLODIPine  Besylate; Take 1 tablet (5 mg total) by mouth daily.  Dispense: 90 tablet; Refill: 1  Persistent atrial fibrillation (HCC) -     Metoprolol  Succinate ER; Take 1 tablet (25 mg total) by mouth daily.  Dispense: 90 tablet; Refill: 1  Controlled type 2 diabetes mellitus without complication, without long-term current use of insulin (HCC)  Corporo-venous occlusive erectile dysfunction -     Tadalafil ; Take 0.5-1 tablets (10-20 mg total) by mouth every other  day as needed for erectile dysfunction.  Dispense: 10 tablet; Refill: 11    Assessment and Plan Assessment & Plan Corporo-venous occlusive erectile dysfunction   He experiences erectile dysfunction with loss of erection during intercourse, influenced by age, hypertension, atrial fibrillation, and diabetes. Discussed using phosphodiesterase inhibitors like sildenafil and tadalafil , explaining his mechanism of enhancing penile blood flow. Compared sildenafil and tadalafil  regarding duration and dosing flexibility, stressing the need for sexual stimulation for effectiveness. Considered daily low-dose tadalafil  for convenience and prostate health. Despite concerns about safety and cost, he agreed to trial tadalafil . Prescribed tadalafil  20 mg, starting with half a tablet as needed. Advised using GoodRx for cost management if insurance coverage is insufficient. Discussed potential daily low-dose  tadalafil  if the initial trial is successful.  Essential hypertension   His blood pressure is generally well-controlled with amlodipine  and metoprolol , though he experiences occasional mild headaches. Office blood pressure readings were elevated, likely due to white coat syndrome. He should continue the current antihypertensive regimen and monitor blood pressure regularly at home.  Persistent atrial fibrillation   Atrial fibrillation is managed with metoprolol , with no recent palpitations or irregular heartbeats. The last cardiology follow-up was in October 2024, with the next scheduled for October 2025. He should continue metoprolol  for rate control and attend the cardiologist follow-up as planned.  Type 2 diabetes mellitus   Diabetes management involves dietary changes, focusing on reducing wheat intake and using alternative pasta options. He is cautious about sugar in snacks and is working to maintain a healthy diet. He should continue these dietary modifications and an A1c test will be scheduled in March.     Return in about 3 months (around 12/19/2024) for chronic follow-up.  Jenkins CHRISTELLA Carrel, MD

## 2024-09-20 NOTE — Patient Instructions (Signed)
 Happy Holidays  1/2 tab for intercourse. Up to whole tab

## 2024-11-18 NOTE — Progress Notes (Unsigned)
 " Cardiology Office Note:  .   Date:  11/18/2024  ID:  Maurice Golden, DOB 1961-08-04, MRN [id number] PCP: Wendolyn Jenkins Jansky, MD  [facility] Providers Cardiologist:  None Electrophysiologist:  Fonda Kitty, MD {  History of Present Illness: .   Maurice Golden is a 64 y.o. male w/PMHx of  HTN, DM, OSA w/CPAP AFib  He was last seen in clinic back in 2023, saw Dr. Kelsie, struggling with knee pain, no reported cardiac complaints/concerns On amiodarone  (low dose) with ell controlled AFib  Today's visit is scheduled as an overdue 6 mo visit (last seen 2023) ROS:   *** amio is off his list *** symptoms, burden *** xarelto , dose, bleeding, labs *** PMD alone?  Arrhythmia/AAD hx AFib ablation 01/26/14 (Dr. Kelsie) Amiodarone  started ~ 2015 >> last fill appears 2024  Studies Reviewed: SABRA    EKG done today and reviewed by myself:  ***  07/15/21: TTE 1. Inferobasal hypokinesis . Left ventricular ejection fraction, by  estimation, is 50 to 55%. The left ventricle has low normal function. The  left ventricle demonstrates regional wall motion abnormalities (see  scoring diagram/findings for description).  The left ventricular internal cavity size was mildly dilated. There is  moderate left ventricular hypertrophy. Left ventricular diastolic  parameters were normal.   2. Right ventricular systolic function is normal. The right ventricular  size is normal.   3. Left atrial size was moderately dilated.   4. Possible PFO suggest f/u bubble study.   5. The mitral valve is abnormal. Trivial mitral valve regurgitation. No  evidence of mitral stenosis.   6. The aortic valve is normal in structure. Aortic valve regurgitation is  not visualized. No aortic stenosis is present.   7. Aortic dilatation noted. 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 There is mild dilatation of the ascending aorta,  measuring 40 mm.   8. The inferior vena cava is normal in  size with greater than 50%  respiratory variability, suggesting right atrial pressure of 3 mmHg.   01/26/2014: EPS/ablation CONCLUSIONS: 1. Atrial fibrillation upon presentation.   2. Rotational Angiography reveals a moderate sized left atrium with four separate pulmonary veins with a short common ostium to the left pulmonary veins 3. Successful electrical isolation and anatomical encircling of all four pulmonary veins with radiofrequency current using a WACA approach. 3. CFAEs were identified and ablated along  the lateral wall of the left atrium and above the coronary sinus.  4. Atrial fibrillation successfully cardioverted to sinus rhythm. 5. No early apparent complications.     Risk Assessment/Calculations:    Physical Exam:   VS:  There were no vitals taken for this visit.   Wt Readings from Last 3 Encounters:  09/20/24 295 lb 6 oz (134 kg)  08/17/24 293 lb (132.9 kg)  06/15/24 293 lb 2 oz (133 kg)    GEN: Well nourished, well developed in no acute distress NECK: No JVD; No carotid bruits CARDIAC: ***RRR, no murmurs, rubs, gallops RESPIRATORY:  *** CTA b/l without rales, wheezing or rhonchi  ABDOMEN: Soft, non-tender, non-distended EXTREMITIES: *** No edema; No deformity   ASSESSMENT AND PLAN: .    persistent AFib CHA2DS2Vasc is 2, on xarelto , *** appropriately dosed *** burden by symptoms  Secondary hypercoagulable state 2/2 AFib     {Are you ordering a CV Procedure (e.g. stress test, cath, DCCV, TEE, etc)?   Press F2        :[id number]}  Dispo: ***  Signed, Charlies Macario Arthur, PA-C   "

## 2024-11-21 ENCOUNTER — Ambulatory Visit: Admitting: Physician Assistant

## 2024-12-19 ENCOUNTER — Ambulatory Visit: Admitting: Family Medicine

## 2025-08-29 ENCOUNTER — Ambulatory Visit
